# Patient Record
Sex: Male | Born: 1940 | Race: White | Hispanic: No | Marital: Married | Smoking: Never smoker
Health system: Southern US, Community
[De-identification: ages and names within clinical notes are randomized; demographics above are authoritative.]

## PROBLEM LIST (undated history)

## (undated) DIAGNOSIS — I519 Heart disease, unspecified: Secondary | ICD-10-CM

## (undated) DIAGNOSIS — E785 Hyperlipidemia, unspecified: Secondary | ICD-10-CM

## (undated) DIAGNOSIS — F32A Depression, unspecified: Secondary | ICD-10-CM

## (undated) DIAGNOSIS — M1990 Unspecified osteoarthritis, unspecified site: Secondary | ICD-10-CM

## (undated) DIAGNOSIS — IMO0001 Reserved for inherently not codable concepts without codable children: Secondary | ICD-10-CM

## (undated) DIAGNOSIS — I10 Essential (primary) hypertension: Secondary | ICD-10-CM

## (undated) DIAGNOSIS — I4510 Unspecified right bundle-branch block: Secondary | ICD-10-CM

## (undated) DIAGNOSIS — F419 Anxiety disorder, unspecified: Secondary | ICD-10-CM

## (undated) DIAGNOSIS — I252 Old myocardial infarction: Secondary | ICD-10-CM

## (undated) DIAGNOSIS — H9190 Unspecified hearing loss, unspecified ear: Secondary | ICD-10-CM

## (undated) DIAGNOSIS — I2510 Atherosclerotic heart disease of native coronary artery without angina pectoris: Secondary | ICD-10-CM

## (undated) DIAGNOSIS — E669 Obesity, unspecified: Secondary | ICD-10-CM

## (undated) DIAGNOSIS — N184 Chronic kidney disease, stage 4 (severe): Secondary | ICD-10-CM

## (undated) DIAGNOSIS — F329 Major depressive disorder, single episode, unspecified: Secondary | ICD-10-CM

## (undated) DIAGNOSIS — I82409 Acute embolism and thrombosis of unspecified deep veins of unspecified lower extremity: Secondary | ICD-10-CM

## (undated) DIAGNOSIS — I509 Heart failure, unspecified: Secondary | ICD-10-CM

## (undated) HISTORY — DX: Unspecified hearing loss, unspecified ear: H91.90

## (undated) HISTORY — DX: Obesity, unspecified: E66.9

## (undated) HISTORY — DX: Unspecified right bundle-branch block: I45.10

## (undated) HISTORY — PX: ELBOW SURGERY: SHX618

## (undated) HISTORY — DX: Anxiety disorder, unspecified: F41.9

## (undated) HISTORY — DX: Hyperlipidemia, unspecified: E78.5

## (undated) HISTORY — DX: Unspecified osteoarthritis, unspecified site: M19.90

## (undated) HISTORY — PX: KNEE SURGERY: SHX244

## (undated) HISTORY — DX: Major depressive disorder, single episode, unspecified: F32.9

## (undated) HISTORY — DX: Depression, unspecified: F32.A

## (undated) HISTORY — DX: Essential (primary) hypertension: I10

## (undated) HISTORY — DX: Acute embolism and thrombosis of unspecified deep veins of unspecified lower extremity: I82.409

## (undated) HISTORY — PX: SHOULDER OPEN ROTATOR CUFF REPAIR: SHX2407

## (undated) HISTORY — DX: Old myocardial infarction: I25.2

---

## 1992-09-12 DIAGNOSIS — I252 Old myocardial infarction: Secondary | ICD-10-CM

## 1992-09-12 HISTORY — DX: Old myocardial infarction: I25.2

## 1995-01-25 HISTORY — PX: CORONARY ARTERY BYPASS GRAFT: SHX141

## 1995-01-25 HISTORY — PX: CARDIAC CATHETERIZATION: SHX172

## 1997-12-30 ENCOUNTER — Other Ambulatory Visit: Admission: RE | Admit: 1997-12-30 | Discharge: 1997-12-30 | Payer: Self-pay | Admitting: Cardiology

## 2002-11-11 ENCOUNTER — Observation Stay (HOSPITAL_COMMUNITY): Admission: EM | Admit: 2002-11-11 | Discharge: 2002-11-12 | Payer: Self-pay | Admitting: Emergency Medicine

## 2002-11-11 ENCOUNTER — Emergency Department (HOSPITAL_COMMUNITY): Admission: EM | Admit: 2002-11-11 | Discharge: 2002-11-11 | Payer: Self-pay | Admitting: Emergency Medicine

## 2007-09-15 ENCOUNTER — Emergency Department (HOSPITAL_COMMUNITY): Admission: EM | Admit: 2007-09-15 | Discharge: 2007-09-15 | Payer: Self-pay | Admitting: Family Medicine

## 2008-07-11 ENCOUNTER — Encounter: Admission: RE | Admit: 2008-07-11 | Discharge: 2008-07-11 | Payer: Self-pay | Admitting: Internal Medicine

## 2009-06-16 HISTORY — PX: CARDIOVASCULAR STRESS TEST: SHX262

## 2009-07-31 ENCOUNTER — Encounter: Admission: RE | Admit: 2009-07-31 | Discharge: 2009-09-08 | Payer: Self-pay | Admitting: Neurology

## 2009-12-16 ENCOUNTER — Ambulatory Visit: Payer: Self-pay | Admitting: Vascular Surgery

## 2010-06-29 ENCOUNTER — Ambulatory Visit: Payer: Self-pay | Admitting: Cardiology

## 2011-01-25 NOTE — Procedures (Signed)
DUPLEX DEEP VENOUS EXAM - LOWER EXTREMITY   INDICATION:  Left lower extremity pain.   HISTORY:  Edema:  No.  Trauma/Surgery:  No.  Pain:  Yes.  PE:  No.  Previous DVT:  Yes.  Anticoagulants:  No.  Other:   DUPLEX EXAM:                CFV   SFV   PopV  PTV    GSV                R  L  R  L  R  L  R   L  R  L  Thrombosis    o  o     o     o      o     o  Spontaneous   +  +     +     +      +     +  Phasic        +  +     +     +      +     +  Augmentation  +  +     +     +      +     +  Compressible  +  +     +     +      +     +  Competent     +  +     +     +      +     +   Legend:  + - yes  o - no  p - partial  D - decreased   IMPRESSION:  No evidence of deep venous thrombosis or superficial venous  thrombosis identified.    _____________________________  Janetta Hora Fields, MD   CJ/MEDQ  D:  12/16/2009  T:  12/16/2009  Job:  [id number]

## 2011-01-28 NOTE — Discharge Summary (Signed)
NAMECARRON, JAGGI NO.:  [id number]   MEDICAL RECORD NO.:  [id number]                   PATIENT TYPE:  INP   LOCATION:  0484                                 FACILITY:  [facility]   PHYSICIAN:  Sherin Quarry, MD                   DATE OF BIRTH:  [birth year]   DATE OF ADMISSION:  11/11/2002  DATE OF DISCHARGE:  11/12/2002                                 DISCHARGE SUMMARY   HISTORY OF PRESENT ILLNESS:  The patient is a 70 year old man who presented  to the emergency room on March 1, with acute onset of diarrhea, nausea and  vomiting which started during the evening.  Of note, was that he had  recently returned from [place] with three family members all of whom had  similar symptoms.  He tried to manage the problem as an outpatient, but  became concerned about possible dehydration.   PAST MEDICAL HISTORY:  1. Hypertension.  2. Coronary artery disease, status post CABG.  3. Hyperlipidemia.  4. History of seizure disorder.   PHYSICAL EXAMINATION:  VITAL SIGNS:  As described by Dr. Nehemiah Settle, temperature  was 96, blood pressure 155/98, respirations 24, O2 saturations 98%.  HEENT:  Within normal limits.  CHEST:  Clear.  CARDIAC:  Normal S1, S2 without murmur, rub or gallop.  ABDOMEN:  Benign with normal bowel sounds without masses, tenderness or  organomegaly.  NEUROLOGIC:  Testing of his extremities was normal.   LABORATORY DATA AND X-RAY FINDINGS:  Unremarkable including hemoglobin of  13.7.  BMET notable only for a creatinine of 1.7 with mild dehydration.   HOSPITAL COURSE:  On admission, the patient was started on IV hydration, D-5  and normal saline with 15 mEq of Kay Ciel with 150 cc per hour.  He was  administered Phenergan as needed for nausea and Lomotil as needed for  diarrhea.  His condition quite rapidly improved by March 2, at noon.  The  patient was able to eat lunch and said he felt considerably better.  He was  still having some mild  diarrhea symptoms, but he felt that he would be able  to get along alright as an outpatient.  Therefore, at that time the patient  was discharged.   DISCHARGE DIAGNOSES:  1. Acute gastroenteritis, probably viral origin.  2. Hypertension.  3. Coronary artery disease, status post coronary artery bypass graft.  4. Hyperlipidemia.  5. History of seizure disorder.   DISCHARGE MEDICATIONS:  1. Carbatrol 200 mg b.i.d.  2. Claritin p.r.n.  3. Gemfibrozil 600 mg b.i.d.  4. Lipitor, uncertain dose.  5. Naproxen 500 mg b.i.d.  6. Prinivil, uncertain dose.   FOLLOW UP:  The patient will continue to follow up with Dr. Juline Patch and Dr.  Deborah Chalk.   CONDITION ON DISCHARGE:  Good.  Sherin Quarry, MD    SY/MEDQ  D:  11/12/2002  T:  11/12/2002  Job:  [id number]   cc:   Juline Patch, M.D.  [facility].   Colleen Can. Deborah Chalk, M.D.  [address]  [place]  [place] [address]  Fax: [phone]

## 2011-02-04 ENCOUNTER — Other Ambulatory Visit: Payer: Self-pay | Admitting: Cardiology

## 2011-02-04 NOTE — Telephone Encounter (Signed)
Refill Lipitor per pharmacy

## 2011-07-04 ENCOUNTER — Other Ambulatory Visit: Payer: Self-pay | Admitting: Cardiology

## 2011-08-10 ENCOUNTER — Other Ambulatory Visit: Payer: Self-pay | Admitting: *Deleted

## 2011-08-11 ENCOUNTER — Telehealth: Payer: Self-pay | Admitting: Nurse Practitioner

## 2011-08-11 NOTE — Telephone Encounter (Signed)
Pt's lisinopril was denied needs appt, made appt 12-10 with lori, pt leaving to go out of town in the am and won't be back until the weekend, can get refill until then? Pt out x 1 week already uses [facility] [address]

## 2011-08-12 MED ORDER — LISINOPRIL 20 MG PO TABS: 20.0000 mg | ORAL_TABLET | Freq: Every day | ORAL | Status: DC

## 2011-08-17 ENCOUNTER — Encounter: Payer: Self-pay | Admitting: Nurse Practitioner

## 2011-08-22 ENCOUNTER — Encounter: Payer: Self-pay | Admitting: Nurse Practitioner

## 2011-08-22 ENCOUNTER — Ambulatory Visit (INDEPENDENT_AMBULATORY_CARE_PROVIDER_SITE_OTHER): Payer: Medicare Other | Admitting: Nurse Practitioner

## 2011-08-22 VITALS — BP 148/88 | HR 80 | Ht 68.0 in | Wt 205.0 lb

## 2011-08-22 DIAGNOSIS — E119 Type 2 diabetes mellitus without complications: Secondary | ICD-10-CM | POA: Insufficient documentation

## 2011-08-22 DIAGNOSIS — I259 Chronic ischemic heart disease, unspecified: Secondary | ICD-10-CM | POA: Insufficient documentation

## 2011-08-22 DIAGNOSIS — I10 Essential (primary) hypertension: Secondary | ICD-10-CM

## 2011-08-22 DIAGNOSIS — E669 Obesity, unspecified: Secondary | ICD-10-CM

## 2011-08-22 LAB — BASIC METABOLIC PANEL
BUN: 24 mg/dL — ABNORMAL HIGH (ref 6–23)
CO2: 23 mEq/L (ref 19–32)
Calcium: 8.9 mg/dL (ref 8.4–10.5)
Chloride: 107 mEq/L (ref 96–112)
Creatinine, Ser: 1.7 mg/dL — ABNORMAL HIGH (ref 0.4–1.5)
GFR: 43.06 mL/min — ABNORMAL LOW (ref >60.00)
Glucose, Bld: 274 mg/dL — ABNORMAL HIGH (ref 70–99)
Potassium: 4.1 mEq/L (ref 3.5–5.1)
Sodium: 138 mEq/L (ref 135–145)

## 2011-08-22 LAB — HEPATIC FUNCTION PANEL
ALT: 47 U/L (ref 0–53)
AST: 41 U/L — ABNORMAL HIGH (ref 0–37)
Albumin: 4.2 g/dL (ref 3.5–5.2)
Alkaline Phosphatase: 53 U/L (ref 39–117)
Bilirubin, Direct: 0.1 mg/dL (ref 0.0–0.3)
Total Bilirubin: 1.4 mg/dL — ABNORMAL HIGH (ref 0.3–1.2)
Total Protein: 7.8 g/dL (ref 6.0–8.3)

## 2011-08-22 LAB — LIPID PANEL
Cholesterol: 136 mg/dL (ref 0–200)
HDL: 35.7 mg/dL — ABNORMAL LOW (ref >39.00)
Total CHOL/HDL Ratio: 4
Triglycerides: 286 mg/dL — ABNORMAL HIGH (ref 0.0–149.0)
VLDL: 57.2 mg/dL — ABNORMAL HIGH (ref 0.0–40.0)

## 2011-08-22 LAB — LDL CHOLESTEROL, DIRECT: Direct LDL: 62.5 mg/dL

## 2011-08-22 MED ORDER — LISINOPRIL 20 MG PO TABS: 20.0000 mg | ORAL_TABLET | Freq: Every day | ORAL | Status: DC

## 2011-08-22 MED ORDER — NITROGLYCERIN 0.4 MG SL SUBL: 0.4000 mg | SUBLINGUAL_TABLET | SUBLINGUAL | Status: DC | PRN

## 2011-08-22 NOTE — Assessment & Plan Note (Signed)
Encouraged him once again to focus on his health.

## 2011-08-22 NOTE — Assessment & Plan Note (Signed)
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 Last nuclear in 2010. No symptoms at present. Will update his stress test. I have refilled his medicines. Will see him back in 6 months with Dr. Jens Som. Patient is agreeable to this plan and will call if any problems develop in the interim.

## 2011-08-22 NOTE — Assessment & Plan Note (Signed)
Blood pressure is up. He has been out of his Lisinopril for the last 3 weeks. I have refilled today.

## 2011-08-22 NOTE — Progress Notes (Signed)
Tristan Baker Date of Birth: 1941/07/20 Medical Record #[id number]  History of Present Illness: Tristan Baker is seen today after an approximate 14 month history. He is seen for Dr. Jens Som. He is a former patient of Dr. Ronnald Nian. He has a history of known ischemic heart disease with prior MI and CABG back in  diabetes, HTN, obesity, past history of DVT, OA and marital stress. Last nuclear in 2010. He is here today to get his medicines refilled. He says he is doing ok. Has had no labs since his last visit 14 months ago. Has run out of his Lisinopril about 3 weeks ago. Blood pressure is up. No chest pain. He tries to be active but does not exercise. He has never embraced healthy habits. Weight is up.   Current Outpatient Prescriptions on File Prior to Visit  Medication Sig Dispense Refill  . amLODipine (NORVASC) 5 MG tablet Take 5 mg by mouth daily.        Marland Kitchen aspirin 325 MG tablet Take 325 mg by mouth daily.        Marland Kitchen atorvastatin (LIPITOR) 40 MG tablet TAKE 1 TABLET BY MOUTH EVERY DAY  30 tablet  5  . glipiZIDE (GLUCOTROL XL) 5 MG 24 hr tablet Take 5 mg by mouth daily.        Marland Kitchen lisinopril (PRINIVIL,ZESTRIL) 20 MG tablet Take 1 tablet (20 mg total) by mouth daily.  15 tablet  0  . Multiple Vitamin (MULTIVITAMIN) tablet Take 1 tablet by mouth daily.        . nitroGLYCERIN (NITROSTAT) 0.4 MG SL tablet Place 0.4 mg under the tongue every 5 (five) minutes as needed.        . sertraline (ZOLOFT) 100 MG tablet Take 100 mg by mouth daily.        Marland Kitchen zonisamide (ZONEGRAN) 100 MG capsule Take 200 mg by mouth daily.          Allergies  Allergen Reactions  . Trilipix     Past Medical History  Diagnosis Date  . IHD (ischemic heart disease)   . Diabetes mellitus   . Hypertension     INTERMITTENT NONCOMPLIANCE  . Obesity   . OA (osteoarthritis)   . DVT (deep venous thrombosis)   . MI, old 51    POSTERIOR M.I.  . RBBB (right bundle branch block)   . Hyperlipidemia   . Anxiety   . Depression   .  Renal insufficiency, mild   . Decreased hearing   . Calf pain     LEFT    Past Surgical History  Procedure Date  . Cardiac catheterization 01/25/1995    NORMAL  . Coronary artery bypass graft 01/25/1995  . Knee surgery   . Cardiovascular stress test 06/16/2009    EF 43%, INFERIOR LATERAL HYPOKINESIA CONSISTENT WITH PREVIOUS INFARCTION, UNCHANGED FROM 2008. NO EVIDENCE OF ISCHEMIA    History  Smoking status  . Never Smoker   Smokeless tobacco  . Not on file    History  Alcohol Use No    Family History  Problem Relation Age of Onset  . Stroke Mother   . Heart disease Father     Review of Systems: The review of systems is per the HPI.  Has had some recent sinus issues. All other systems were reviewed and are negative.  Physical Exam: Ht 5\' 8"  (1.727 m)  Wt 205 lb (92.987 kg)  BMI 31.17 kg/m2 Patient is very pleasant and in no acute distress. He is obese.  Skin is warm and dry. Color is normal.  HEENT is unremarkable. Normocephalic/atraumatic. PERRL. Sclera are nonicteric. Neck is supple. No masses. No JVD. Lungs are clear. Cardiac exam shows a regular rate and rhythm. Abdomen is obese but soft. Extremities are without edema. Gait and ROM are intact. No gross neurologic deficits noted.   LABORATORY DATA: PENDING  Assessment / Plan:

## 2011-08-22 NOTE — Patient Instructions (Addendum)
Dr. Olga Millers is your attending cardiologist.   We are going to check your labs today.  I have refilled your Lisinopril and NTG. Get back on your Lisinopril for your blood pressure.  We are going to update your stress test.  Call the [facility] office at [phone] if you have any questions, problems or concerns.

## 2011-08-22 NOTE — Assessment & Plan Note (Signed)
Not checking his sugars. He is going to try and arrange follow up with Dr. Clelia Croft.

## 2011-08-23 ENCOUNTER — Telehealth: Payer: Self-pay | Admitting: Nurse Practitioner

## 2011-08-23 NOTE — Telephone Encounter (Signed)
New message:  Pt seen Lawson Fiscal yesterday and has a question about the medication he was given yesterday and the dosage.

## 2011-08-23 NOTE — Telephone Encounter (Signed)
Pt states he was given an rx for Lisinopril 20mg  qd.  He states he has been taking 20mg  bid prior to yesterday and was not aware that the rx had been decreased.  Please advise on the correct dose for pt to take.

## 2011-08-23 NOTE — Telephone Encounter (Signed)
Must have been abstracted wrong. He may take Lisinopril 20 mg BID.  Thanks

## 2011-08-23 NOTE — Telephone Encounter (Signed)
Pt was notified.  [facility] another 30 pills for the price of 60 total instead of two copays.

## 2011-08-25 ENCOUNTER — Other Ambulatory Visit (HOSPITAL_COMMUNITY): Payer: Medicare Other | Admitting: Radiology

## 2011-08-29 ENCOUNTER — Other Ambulatory Visit (HOSPITAL_COMMUNITY): Payer: Medicare Other | Admitting: Radiology

## 2012-11-27 ENCOUNTER — Telehealth: Payer: Self-pay | Admitting: *Deleted

## 2012-11-27 MED ORDER — ATORVASTATIN CALCIUM 40 MG PO TABS: 40.0000 mg | ORAL_TABLET | Freq: Every day | ORAL | Status: DC

## 2012-11-27 NOTE — Telephone Encounter (Signed)
[facility] which per request was filled 08/31/11. Discussed with Lawson Fiscal and since patient has not been here since 2012 refill needs to come from PCP. [facility] [phone]

## 2012-11-27 NOTE — Telephone Encounter (Signed)
S/w pt's wife pt needs refill on atorvastatin 40 mg daily but pt needs appt. Hasn't been seen in our office since 2012 with Dr. Deborah Chalk. [facility] [address] and I also made pt appt to see Norma Fredrickson, NP on 12/04/12.

## 2012-12-04 ENCOUNTER — Other Ambulatory Visit: Payer: Self-pay | Admitting: Nurse Practitioner

## 2012-12-04 ENCOUNTER — Encounter: Payer: Self-pay | Admitting: Nurse Practitioner

## 2012-12-04 ENCOUNTER — Ambulatory Visit (INDEPENDENT_AMBULATORY_CARE_PROVIDER_SITE_OTHER): Payer: Medicare Other | Admitting: Nurse Practitioner

## 2012-12-04 VITALS — BP 150/78 | HR 68 | Ht 68.0 in | Wt 200.0 lb

## 2012-12-04 DIAGNOSIS — E119 Type 2 diabetes mellitus without complications: Secondary | ICD-10-CM

## 2012-12-04 DIAGNOSIS — I259 Chronic ischemic heart disease, unspecified: Secondary | ICD-10-CM

## 2012-12-04 DIAGNOSIS — E785 Hyperlipidemia, unspecified: Secondary | ICD-10-CM

## 2012-12-04 DIAGNOSIS — Z01810 Encounter for preprocedural cardiovascular examination: Secondary | ICD-10-CM

## 2012-12-04 DIAGNOSIS — R079 Chest pain, unspecified: Secondary | ICD-10-CM

## 2012-12-04 LAB — PROTIME-INR
INR: 1 ratio (ref 0.8–1.0)
Prothrombin Time: 10.5 s (ref 10.2–12.4)

## 2012-12-04 LAB — BASIC METABOLIC PANEL
BUN: 18 mg/dL (ref 6–23)
CO2: 26 mEq/L (ref 19–32)
Calcium: 9.2 mg/dL (ref 8.4–10.5)
Chloride: 105 mEq/L (ref 96–112)
Creatinine, Ser: 1.5 mg/dL (ref 0.4–1.5)
GFR: 48.53 mL/min — ABNORMAL LOW (ref >60.00)
Glucose, Bld: 262 mg/dL — ABNORMAL HIGH (ref 70–99)
Potassium: 4.3 mEq/L (ref 3.5–5.1)
Sodium: 137 mEq/L (ref 135–145)

## 2012-12-04 LAB — CBC WITH DIFFERENTIAL/PLATELET
Basophils Absolute: 0 10*3/uL (ref 0.0–0.1)
Basophils Relative: 0.5 % (ref 0.0–3.0)
Eosinophils Absolute: 0.1 10*3/uL (ref 0.0–0.7)
Eosinophils Relative: 2.3 % (ref 0.0–5.0)
HCT: 40.8 % (ref 39.0–52.0)
Hemoglobin: 14 g/dL (ref 13.0–17.0)
Lymphocytes Relative: 20.5 % (ref 12.0–46.0)
Lymphs Abs: 1.2 10*3/uL (ref 0.7–4.0)
MCHC: 34.3 g/dL (ref 30.0–36.0)
MCV: 95.7 fl (ref 78.0–100.0)
Monocytes Absolute: 0.4 10*3/uL (ref 0.1–1.0)
Monocytes Relative: 6.6 % (ref 3.0–12.0)
Neutro Abs: 4.1 10*3/uL (ref 1.4–7.7)
Neutrophils Relative %: 70.1 % (ref 43.0–77.0)
Platelets: 213 10*3/uL (ref 150.0–400.0)
RBC: 4.27 Mil/uL (ref 4.22–5.81)
RDW: 13 % (ref 11.5–14.6)
WBC: 5.9 10*3/uL (ref 4.5–10.5)

## 2012-12-04 LAB — HEPATIC FUNCTION PANEL
ALT: 39 U/L (ref 0–53)
AST: 40 U/L — ABNORMAL HIGH (ref 0–37)
Albumin: 4 g/dL (ref 3.5–5.2)
Alkaline Phosphatase: 57 U/L (ref 39–117)
Bilirubin, Direct: 0.1 mg/dL (ref 0.0–0.3)
Total Bilirubin: 0.9 mg/dL (ref 0.3–1.2)
Total Protein: 7.5 g/dL (ref 6.0–8.3)

## 2012-12-04 LAB — LIPID PANEL
Cholesterol: 146 mg/dL (ref 0–200)
HDL: 31.1 mg/dL — ABNORMAL LOW (ref >39.00)
Total CHOL/HDL Ratio: 5
Triglycerides: 522 mg/dL — ABNORMAL HIGH (ref 0.0–149.0)
VLDL: 104.4 mg/dL — ABNORMAL HIGH (ref 0.0–40.0)

## 2012-12-04 LAB — APTT: aPTT: 28.6 s (ref 21.7–28.8)

## 2012-12-04 LAB — LDL CHOLESTEROL, DIRECT: Direct LDL: 58.5 mg/dL

## 2012-12-04 LAB — HEMOGLOBIN A1C: Hgb A1c MFr Bld: 8.2 % — ABNORMAL HIGH (ref 4.6–6.5)

## 2012-12-04 MED ORDER — NITROGLYCERIN 0.4 MG SL SUBL: 0.4000 mg | SUBLINGUAL_TABLET | SUBLINGUAL | Status: DC | PRN

## 2012-12-04 NOTE — Progress Notes (Addendum)
 Tristan Baker Date of Birth: 05/30/1941 Medical Record #[id number]  History of Present Illness: Tristan Baker is seen back today for a follow up visit. He is seen for Tristan Baker. He is a former patient of Tristan Baker. Last seen here in December of 2012. 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 Last stress test was in 2010 showing EF of 43% with inferior lateral hypokinesia and no ischemia. Other issues include DM, HTN, obesity, past DVT, OA and marital stress. Does not exercise. Never has embraced healthy habits.   He comes in today. He is here alone. Needs his medicines refilled again. No recent lab again. Not exercising. Has lost 5 pounds but does not know how. Has started having exertional chest tightness since January. Resolves with rest. Associated with shortness of breath. No spells at rest. Not checking his sugars. Does not know what his A1C is running. Has not seen Tristan Baker in quite a while. Stress testing was recommended at his last visit but he cancelled.    Current Outpatient Prescriptions on File Prior to Visit  Medication Sig Dispense Refill  . amLODipine (NORVASC) 5 MG tablet Take 5 mg by mouth daily.        . aspirin 325 MG tablet Take 325 mg by mouth daily.        . atorvastatin (LIPITOR) 40 MG tablet Take 1 tablet (40 mg total) by mouth daily.  30 tablet  0  . glipiZIDE (GLUCOTROL XL) 5 MG 24 hr tablet Take 5 mg by mouth daily.        . lisinopril (PRINIVIL,ZESTRIL) 20 MG tablet Take 1 tablet (20 mg total) by mouth daily.  30 tablet  0  . Multiple Vitamin (MULTIVITAMIN) tablet Take 1 tablet by mouth daily.        . nitroGLYCERIN (NITROSTAT) 0.4 MG SL tablet Place 1 tablet (0.4 mg total) under the tongue every 5 (five) minutes as needed.  25 tablet  6  . sertraline (ZOLOFT) 100 MG tablet Take 100 mg by mouth daily.        . zonisamide (ZONEGRAN) 100 MG capsule Take 200 mg by mouth daily.         No current facility-administered medications on file prior to  visit.    Allergies  Allergen Reactions  . Choline Fenofibrate     Past Medical History  Diagnosis Date  . IHD (ischemic heart disease)     Remote MI in 1994 with CABG in 1996; last nuclear in 2010  . Diabetes mellitus   . Hypertension     INTERMITTENT NONCOMPLIANCE  . Obesity   . OA (osteoarthritis)   . DVT (deep venous thrombosis)   . MI, old 1994    POSTERIOR M.I.  . RBBB (right bundle branch block)   . Hyperlipidemia   . Anxiety   . Depression   . Renal insufficiency, mild   . Decreased hearing     Past Surgical History  Procedure Laterality Date  . Cardiac catheterization  01/25/1995    NORMAL  . Coronary artery bypass graft  01/25/1995  . Knee surgery    . Cardiovascular stress test  06/16/2009    EF 43%, INFERIOR LATERAL HYPOKINESIA CONSISTENT WITH PREVIOUS INFARCTION, UNCHANGED FROM 2008. NO EVIDENCE OF ISCHEMIA    History  Smoking status  . Never Smoker   Smokeless tobacco  . Not on file    History  Alcohol Use No    Family History  Problem Relation   Age of Onset  . Stroke Mother   . Heart disease Father     Review of Systems: The review of systems is per the HPI.  All other systems were reviewed and are negative.  Physical Exam: BP 150/78  Pulse 68  Ht 5' 8" (1.727 m)  Wt 200 lb (90.719 kg)  BMI 30.42 kg/m2 Patient is an obese white male who is in no acute distress. He remains obese. Skin is warm and dry. Color is normal.  HEENT is unremarkable. Normocephalic/atraumatic. PERRL. Sclera are nonicteric. Neck is supple. No masses. No JVD. Lungs are clear. Cardiac exam shows a regular rate and rhythm. Abdomen is soft. Extremities are without edema. Gait and ROM are intact. No gross neurologic deficits noted.   LABORATORY DATA: PENDING  EKG is obtained today. He is in sinus rhythm with a RBBB. Inferior Q's. I do not have an old EKG to compare with.   Lab Results  Component Value Date   GLUCOSE 274* 08/22/2011   CHOL 136 08/22/2011   TRIG  286.0* 08/22/2011   HDL 35.70* 08/22/2011   LDLDIRECT 62.5 08/22/2011   ALT 47 08/22/2011   AST 41* 08/22/2011   NA 138 08/22/2011   K 4.1 08/22/2011   CL 107 08/22/2011   CREATININE 1.7* 08/22/2011   BUN 24* 08/22/2011   CO2 23 08/22/2011   Assessment / Plan: 1. CAD - remote CABG - last stress test in 2010 - he is having recurrent exertional chest tightness that resolves with rest and is associated with dyspnea. I have recommended cardiac catheterization. He has never been cathed since his original surgery with Dr. Shanisha Baker. The procedure, risks and benefits have been reviewed and he is willing to proceed.   2. Obesity - still an issue  3. HTN - BP is up some today. Says he is taking his medicines. Does not check at home.   4. HLD - recheck labs today  5. DM - check A1C today.  Patient is agreeable to this plan and will call if any problems develop in the interim.   Tristan Yaffe C. Bailen Geffre, RN, ANP-C [facility] [address] [place], [place]  [address]  

## 2012-12-04 NOTE — Patient Instructions (Addendum)
We will check labs today  Stay on your current medicines  I have refilled your NTG today  We are going to arrange for a heart catheterization next Tuesday with Dr. [place]  You are scheduled for a cardiac catheterization on Tuesday, April 1st with Dr. [place] or associate.  Go to [facility] 2nd Floor Short Stay on Tuesday, April 1st at 7am. No food or drink after midnight on Monday. You may take your medications with a sip of water on the day of your procedure. But do not take your diabetic medicine that morning.

## 2012-12-11 ENCOUNTER — Encounter (HOSPITAL_COMMUNITY)
Admission: RE | Disposition: A | Discharge status: Home / Self Care | Payer: Self-pay | Source: Ambulatory Visit | Attending: Cardiology

## 2012-12-11 ENCOUNTER — Encounter (HOSPITAL_COMMUNITY): Payer: Self-pay | Admitting: General Practice

## 2012-12-11 ENCOUNTER — Ambulatory Visit (HOSPITAL_COMMUNITY): Payer: Medicare Other

## 2012-12-11 ENCOUNTER — Ambulatory Visit (HOSPITAL_COMMUNITY)
Admission: RE | Admit: 2012-12-11 | Discharge: 2012-12-12 | Disposition: A | Discharge status: Home / Self Care | Payer: Medicare Other | Source: Ambulatory Visit | Attending: Cardiology | Admitting: Cardiology

## 2012-12-11 DIAGNOSIS — N183 Chronic kidney disease, stage 3 unspecified: Secondary | ICD-10-CM | POA: Insufficient documentation

## 2012-12-11 DIAGNOSIS — Z6830 Body mass index (BMI) 30.0-30.9, adult: Secondary | ICD-10-CM | POA: Insufficient documentation

## 2012-12-11 DIAGNOSIS — I259 Chronic ischemic heart disease, unspecified: Secondary | ICD-10-CM | POA: Diagnosis present

## 2012-12-11 DIAGNOSIS — E785 Hyperlipidemia, unspecified: Secondary | ICD-10-CM | POA: Insufficient documentation

## 2012-12-11 DIAGNOSIS — Z7902 Long term (current) use of antithrombotics/antiplatelets: Secondary | ICD-10-CM | POA: Insufficient documentation

## 2012-12-11 DIAGNOSIS — I209 Angina pectoris, unspecified: Secondary | ICD-10-CM | POA: Insufficient documentation

## 2012-12-11 DIAGNOSIS — I25810 Atherosclerosis of coronary artery bypass graft(s) without angina pectoris: Secondary | ICD-10-CM | POA: Insufficient documentation

## 2012-12-11 DIAGNOSIS — I2510 Atherosclerotic heart disease of native coronary artery without angina pectoris: Secondary | ICD-10-CM | POA: Insufficient documentation

## 2012-12-11 DIAGNOSIS — I129 Hypertensive chronic kidney disease with stage 1 through stage 4 chronic kidney disease, or unspecified chronic kidney disease: Secondary | ICD-10-CM | POA: Insufficient documentation

## 2012-12-11 DIAGNOSIS — I517 Cardiomegaly: Secondary | ICD-10-CM

## 2012-12-11 DIAGNOSIS — E119 Type 2 diabetes mellitus without complications: Secondary | ICD-10-CM | POA: Insufficient documentation

## 2012-12-11 DIAGNOSIS — Z8679 Personal history of other diseases of the circulatory system: Secondary | ICD-10-CM

## 2012-12-11 DIAGNOSIS — I4510 Unspecified right bundle-branch block: Secondary | ICD-10-CM | POA: Insufficient documentation

## 2012-12-11 DIAGNOSIS — Z01810 Encounter for preprocedural cardiovascular examination: Secondary | ICD-10-CM

## 2012-12-11 DIAGNOSIS — I10 Essential (primary) hypertension: Secondary | ICD-10-CM | POA: Diagnosis present

## 2012-12-11 DIAGNOSIS — E669 Obesity, unspecified: Secondary | ICD-10-CM | POA: Insufficient documentation

## 2012-12-11 HISTORY — DX: Heart disease, unspecified: I51.9

## 2012-12-11 HISTORY — DX: Atherosclerotic heart disease of native coronary artery without angina pectoris: I25.10

## 2012-12-11 HISTORY — PX: LEFT HEART CATHETERIZATION WITH CORONARY/GRAFT ANGIOGRAM: SHX5450

## 2012-12-11 LAB — GLUCOSE, CAPILLARY: Glucose-Capillary: 111 mg/dL — ABNORMAL HIGH (ref 70–99)

## 2012-12-11 SURGERY — LEFT HEART CATHETERIZATION WITH CORONARY/GRAFT ANGIOGRAM
Anesthesia: LOCAL

## 2012-12-11 MED ORDER — NITROGLYCERIN 1 MG/10 ML FOR IR/CATH LAB
INTRA_ARTERIAL | Status: AC
  Filled 2012-12-11: qty 10

## 2012-12-11 MED ORDER — SODIUM CHLORIDE 0.9 % IV SOLN: 1.0000 mL/kg/h | INTRAVENOUS | Status: AC

## 2012-12-11 MED ORDER — AMLODIPINE BESYLATE 5 MG PO TABS
5.0000 mg | ORAL_TABLET | Freq: Every day | ORAL | Status: DC
  Administered 2012-12-11 – 2012-12-12 (×2): 5 mg via ORAL
  Filled 2012-12-11 (×2): qty 1

## 2012-12-11 MED ORDER — GLIPIZIDE ER 5 MG PO TB24
5.0000 mg | ORAL_TABLET | Freq: Every day | ORAL | Status: DC
  Administered 2012-12-11 – 2012-12-12 (×2): 5 mg via ORAL
  Filled 2012-12-11 (×3): qty 1

## 2012-12-11 MED ORDER — DIAZEPAM 5 MG PO TABS
5.0000 mg | ORAL_TABLET | ORAL | Status: AC
  Administered 2012-12-11: 5 mg via ORAL

## 2012-12-11 MED ORDER — HEPARIN (PORCINE) IN NACL 2-0.9 UNIT/ML-% IJ SOLN
INTRAMUSCULAR | Status: AC
  Filled 2012-12-11: qty 1000

## 2012-12-11 MED ORDER — ACETAMINOPHEN 325 MG PO TABS
650.0000 mg | ORAL_TABLET | ORAL | Status: DC | PRN
  Administered 2012-12-11: 650 mg via ORAL
  Filled 2012-12-11: qty 2

## 2012-12-11 MED ORDER — SODIUM CHLORIDE 0.9 % IV SOLN: INTRAVENOUS | Status: DC

## 2012-12-11 MED ORDER — ASPIRIN 81 MG PO CHEW
CHEWABLE_TABLET | ORAL | Status: AC
  Filled 2012-12-11: qty 4

## 2012-12-11 MED ORDER — ONDANSETRON HCL 4 MG/2ML IJ SOLN: 4.0000 mg | Freq: Four times a day (QID) | INTRAMUSCULAR | Status: DC | PRN

## 2012-12-11 MED ORDER — NITROGLYCERIN 0.4 MG SL SUBL: 0.4000 mg | SUBLINGUAL_TABLET | SUBLINGUAL | Status: DC | PRN

## 2012-12-11 MED ORDER — ATORVASTATIN CALCIUM 40 MG PO TABS
40.0000 mg | ORAL_TABLET | Freq: Every day | ORAL | Status: DC
  Administered 2012-12-11: 40 mg via ORAL
  Filled 2012-12-11 (×2): qty 1

## 2012-12-11 MED ORDER — SODIUM CHLORIDE 0.9 % IV SOLN: 250.0000 mL | INTRAVENOUS | Status: DC | PRN

## 2012-12-11 MED ORDER — PRASUGREL HCL 10 MG PO TABS
10.0000 mg | ORAL_TABLET | Freq: Every day | ORAL | Status: DC
  Filled 2012-12-11: qty 1

## 2012-12-11 MED ORDER — DIAZEPAM 5 MG PO TABS
ORAL_TABLET | ORAL | Status: AC
  Filled 2012-12-11: qty 1

## 2012-12-11 MED ORDER — SODIUM CHLORIDE 0.9 % IJ SOLN: 3.0000 mL | Freq: Two times a day (BID) | INTRAMUSCULAR | Status: DC

## 2012-12-11 MED ORDER — PRASUGREL HCL 10 MG PO TABS
ORAL_TABLET | ORAL | Status: AC
  Administered 2012-12-12: 10 mg via ORAL
  Filled 2012-12-11: qty 6

## 2012-12-11 MED ORDER — FENTANYL CITRATE 0.05 MG/ML IJ SOLN
INTRAMUSCULAR | Status: AC
  Filled 2012-12-11: qty 2

## 2012-12-11 MED ORDER — ADULT MULTIVITAMIN W/MINERALS CH
1.0000 | ORAL_TABLET | Freq: Every day | ORAL | Status: DC
  Administered 2012-12-11 – 2012-12-12 (×2): 1 via ORAL
  Filled 2012-12-11 (×2): qty 1

## 2012-12-11 MED ORDER — SODIUM CHLORIDE 0.9 % IJ SOLN: 3.0000 mL | INTRAMUSCULAR | Status: DC | PRN

## 2012-12-11 MED ORDER — ONE-DAILY MULTI VITAMINS PO TABS
1.0000 | ORAL_TABLET | Freq: Every day | ORAL | Status: DC
Start: 2012-12-11 — End: 2012-12-11

## 2012-12-11 MED ORDER — LIDOCAINE HCL (PF) 1 % IJ SOLN
INTRAMUSCULAR | Status: AC
  Filled 2012-12-11: qty 30

## 2012-12-11 MED ORDER — MIDAZOLAM HCL 2 MG/2ML IJ SOLN
INTRAMUSCULAR | Status: AC
  Filled 2012-12-11: qty 2

## 2012-12-11 MED ORDER — BIVALIRUDIN 250 MG IV SOLR
INTRAVENOUS | Status: AC
  Filled 2012-12-11: qty 250

## 2012-12-11 MED ORDER — SERTRALINE HCL 100 MG PO TABS
100.0000 mg | ORAL_TABLET | Freq: Every day | ORAL | Status: DC
  Administered 2012-12-11 – 2012-12-12 (×2): 100 mg via ORAL
  Filled 2012-12-11 (×2): qty 1

## 2012-12-11 MED ORDER — ASPIRIN 81 MG PO CHEW
324.0000 mg | CHEWABLE_TABLET | ORAL | Status: AC
  Administered 2012-12-11: 324 mg via ORAL

## 2012-12-11 MED ORDER — ZONISAMIDE 100 MG PO CAPS
200.0000 mg | ORAL_CAPSULE | Freq: Every day | ORAL | Status: DC
  Administered 2012-12-11 – 2012-12-12 (×2): 200 mg via ORAL
  Filled 2012-12-11 (×2): qty 2

## 2012-12-11 NOTE — Progress Notes (Signed)
*  PRELIMINARY RESULTS* Echocardiogram 2D Echocardiogram has been performed.  Jeryl Columbia 12/11/2012, 4:13 PM

## 2012-12-11 NOTE — Interval H&P Note (Signed)
History and Physical Interval Note:  12/11/2012 9:12 AM  Tristan Baker  has presented today for surgery, with the diagnosis of cp/cad  The various methods of treatment have been discussed with the patient and family. After consideration of risks, benefits and other options for treatment, the patient has consented to  Procedure(s): LEFT HEART CATHETERIZATION WITH CORONARY/GRAFT ANGIOGRAM (N/A) as a surgical intervention .  The patient's history has been reviewed, patient examined, no change in status, stable for surgery.  I have reviewed the patient's chart and labs.  Questions were answered to the patient's satisfaction.     Theron Arista [facility] 12/11/2012 9:12 AM

## 2012-12-11 NOTE — Progress Notes (Signed)
Utilization Review Completed Garreth Burnsworth J. Michaelanthony Kempton, RN, BSN, NCM [phone]  

## 2012-12-11 NOTE — CV Procedure (Signed)
Cardiac Catheterization Procedure Note  Name: Tristan Baker MRN: [id number] DOB: Sep 08, 1941  Procedure: Left Heart Cath, Selective Coronary Angiography, saphenous vein graft angiography, RIMA and LIMA graft angiography, PTCA/Stent of the SVG to the OMs  Indication: 72 year-old white male with history of diabetes, hyperlipidemia, and hypertension. He is status post coronary bypass surgery in 1996. He presents with progressive anginal symptoms class III.   Diagnostic Procedure Details: The right groin was prepped, draped, and anesthetized with 1% lidocaine. Using the modified Seldinger technique, a 5 French sheath was introduced into the right femoral artery. Standard Judkins catheters were used for selective coronary angiography and left ventriculography. Catheter exchanges were performed over a wire.  The diagnostic procedure was well-tolerated without immediate complications. A total of 105 cc of Omnipaque was used for the entire procedure.  PROCEDURAL FINDINGS Hemodynamics: AO 125/66 with a mean of 92 mmHg LV 124/17 mmHg  Coronary angiography: Coronary dominance: right  Left mainstem: The left main coronary is diffusely diseased up to 80-90%.  Left anterior descending (LAD): The left anterior descending artery is occluded after the second diagonal branch. The first and second diagonal branches are small in size and without significant disease.  Left circumflex (LCx): The left circumflex is occluded proximally.  Right coronary artery (RCA): The right coronary is occluded at its origin.  There is a sequential saphenous vein graft to the first and second obtuse marginal vessels. There is a 95% stenosis in the distal vein graft between the 2 marginal branches.  The RIMA graft is in situ. It is a very large graft which inserts into PDA. It has retrograde filling of the distal right coronary and posterior lateral branches. The graft is widely patent. There is an 80-90% stenosis in the PDA  in the mid to distal vessel. The posterior lateral branches are diffusely diseased up to 50-70% proximally.  The LIMA graft to the LAD is a large graft that is widely patent. The LAD does give collaterals to the distal RCA and the obtuse marginal vessel.  Left ventriculography: Not performed due to to concerns about dye load.  PCI Procedure Note:  Following the diagnostic procedure, the decision was made to proceed with PCI. The sheath was upsized to a 6 [place]. Weight-based bivalirudin was given for anticoagulation. Effient 60 mg was given orally. Once a therapeutic ACT was achieved, a 6 French LCB guide catheter was inserted.  A pro-water coronary guidewire was used to cross the lesion. There was not enough landing zone for a filter wire distal to the lesion. The lesion was predilated with a 2.5 mm balloon.  The lesion was then stented with a 3.5 x 12 mm Promus premier stent.  Following PCI, there was 0% residual stenosis and TIMI-3 flow. Final angiography confirmed an excellent result. Femoral hemostasis was achieved with manual compression.  The patient tolerated the PCI procedure well. There were no immediate procedural complications.  The patient was transferred to the post catheterization recovery area for further monitoring.  PCI Data: Vessel - SVG to OM1 and 2/Segment - distal Percent Stenosis (pre)  95% TIMI-flow 2 Stent 3.5 x 12 mm Promus premier Percent Stenosis (post) 0% TIMI-flow (post) 3  Final Conclusions:   1. Severe three-vessel and left main obstructive coronary disease. 2. Patent LIMA graft to the LAD 3. Patent RIMA graft to the PDA 4. Severe stenosis in the saphenous vein graft to the first and second obtuse marginal vessels between the 2 grafted vessels. 5. Successful stenting of the saphenous  vein graft to the OM is using a drug-eluting stent.  Recommendations: Continue dual antiplatelet therapy for at least one year. We will assess LV function cardiogram.  Theron Arista  [facility] 12/11/2012, 10:05 AM

## 2012-12-11 NOTE — Progress Notes (Signed)
Site area: right groin  Site Prior to Removal:  Level 0  Pressure Applied For 25 MINUTES    Minutes Beginning at 1250  Manual:   yes  Patient Status During Pull:  AAO X 3  Post Pull Groin Site:  Level 0  Post Pull Instructions Given:  yes  Post Pull Pulses Present:  yes  Dressing Applied:  yes  Comments:  Tolerated procedure well

## 2012-12-11 NOTE — H&P (View-Only) (Signed)
Tristan Baker Date of Birth: Sep 25, 1940 Medical Record #[id number]  History of Present Illness: Tristan Baker is seen back today for a follow up visit. He is seen for Dr. Jens Som. He is a former patient of Dr. Ronnald Nian. Last seen here in December of 2012. 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 Last stress test was in 2010 showing EF of 43% with inferior lateral hypokinesia and no ischemia. Other issues include DM, HTN, obesity, past DVT, OA and marital stress. Does not exercise. Never has embraced healthy habits.   He comes in today. He is here alone. Needs his medicines refilled again. No recent lab again. Not exercising. Has lost 5 pounds but does not know how. Has started having exertional chest tightness since January. Resolves with rest. Associated with shortness of breath. No spells at rest. Not checking his sugars. Does not know what his A1C is running. Has not seen Dr. Clelia Croft in quite a while. Stress testing was recommended at his last visit but he cancelled.    Current Outpatient Prescriptions on File Prior to Visit  Medication Sig Dispense Refill  . amLODipine (NORVASC) 5 MG tablet Take 5 mg by mouth daily.        Marland Kitchen aspirin 325 MG tablet Take 325 mg by mouth daily.        Marland Kitchen atorvastatin (LIPITOR) 40 MG tablet Take 1 tablet (40 mg total) by mouth daily.  30 tablet  0  . glipiZIDE (GLUCOTROL XL) 5 MG 24 hr tablet Take 5 mg by mouth daily.        Marland Kitchen lisinopril (PRINIVIL,ZESTRIL) 20 MG tablet Take 1 tablet (20 mg total) by mouth daily.  30 tablet  0  . Multiple Vitamin (MULTIVITAMIN) tablet Take 1 tablet by mouth daily.        . nitroGLYCERIN (NITROSTAT) 0.4 MG SL tablet Place 1 tablet (0.4 mg total) under the tongue every 5 (five) minutes as needed.  25 tablet  6  . sertraline (ZOLOFT) 100 MG tablet Take 100 mg by mouth daily.        Marland Kitchen zonisamide (ZONEGRAN) 100 MG capsule Take 200 mg by mouth daily.         No current facility-administered medications on file prior to  visit.    Allergies  Allergen Reactions  . Choline Fenofibrate     Past Medical History  Diagnosis Date  . IHD (ischemic heart disease)     Remote MI in 1994 with CABG in 1996; last nuclear in 2010  . Diabetes mellitus   . Hypertension     INTERMITTENT NONCOMPLIANCE  . Obesity   . OA (osteoarthritis)   . DVT (deep venous thrombosis)   . MI, old 77    POSTERIOR M.I.  . RBBB (right bundle branch block)   . Hyperlipidemia   . Anxiety   . Depression   . Renal insufficiency, mild   . Decreased hearing     Past Surgical History  Procedure Laterality Date  . Cardiac catheterization  01/25/1995    NORMAL  . Coronary artery bypass graft  01/25/1995  . Knee surgery    . Cardiovascular stress test  06/16/2009    EF 43%, INFERIOR LATERAL HYPOKINESIA CONSISTENT WITH PREVIOUS INFARCTION, UNCHANGED FROM 2008. NO EVIDENCE OF ISCHEMIA    History  Smoking status  . Never Smoker   Smokeless tobacco  . Not on file    History  Alcohol Use No    Family History  Problem Relation  Age of Onset  . Stroke Mother   . Heart disease Father     Review of Systems: The review of systems is per the HPI.  All other systems were reviewed and are negative.  Physical Exam: BP 150/78  Pulse 68  Ht 5\' 8"  (1.727 m)  Wt 200 lb (90.719 kg)  BMI 30.42 kg/m2 Patient is an obese white male who is in no acute distress. He remains obese. Skin is warm and dry. Color is normal.  HEENT is unremarkable. Normocephalic/atraumatic. PERRL. Sclera are nonicteric. Neck is supple. No masses. No JVD. Lungs are clear. Cardiac exam shows a regular rate and rhythm. Abdomen is soft. Extremities are without edema. Gait and ROM are intact. No gross neurologic deficits noted.   LABORATORY DATA: PENDING  EKG is obtained today. He is in sinus rhythm with a RBBB. Inferior Q's. I do not have an old EKG to compare with.   Lab Results  Component Value Date   GLUCOSE 274* 08/22/2011   CHOL 136 08/22/2011   TRIG  286.0* 08/22/2011   HDL 35.70* 08/22/2011   LDLDIRECT 62.5 08/22/2011   ALT 47 08/22/2011   AST 41* 08/22/2011   NA 138 08/22/2011   K 4.1 08/22/2011   CL 107 08/22/2011   CREATININE 1.7* 08/22/2011   BUN 24* 08/22/2011   CO2 23 08/22/2011   Assessment / Plan: 1. CAD - remote CABG - last stress test in 2010 - he is having recurrent exertional chest tightness that resolves with rest and is associated with dyspnea. I have recommended cardiac catheterization. He has never been cathed since his original surgery with Dr. Tyrone Sage. The procedure, risks and benefits have been reviewed and he is willing to proceed.   2. Obesity - still an issue  3. HTN - BP is up some today. Says he is taking his medicines. Does not check at home.   4. HLD - recheck labs today  5. DM - check A1C today.  Patient is agreeable to this plan and will call if any problems develop in the interim.   Rosalio Macadamia, RN, ANP-C [facility] [address] [place], [place]  [address]

## 2012-12-12 ENCOUNTER — Other Ambulatory Visit: Payer: Self-pay | Admitting: Physician Assistant

## 2012-12-12 ENCOUNTER — Encounter (HOSPITAL_COMMUNITY): Payer: Self-pay | Admitting: Physician Assistant

## 2012-12-12 DIAGNOSIS — Z8679 Personal history of other diseases of the circulatory system: Secondary | ICD-10-CM

## 2012-12-12 DIAGNOSIS — I2510 Atherosclerotic heart disease of native coronary artery without angina pectoris: Secondary | ICD-10-CM | POA: Diagnosis present

## 2012-12-12 DIAGNOSIS — I10 Essential (primary) hypertension: Secondary | ICD-10-CM

## 2012-12-12 DIAGNOSIS — N189 Chronic kidney disease, unspecified: Secondary | ICD-10-CM

## 2012-12-12 DIAGNOSIS — I259 Chronic ischemic heart disease, unspecified: Secondary | ICD-10-CM

## 2012-12-12 LAB — CBC
HCT: 38 % — ABNORMAL LOW (ref 39.0–52.0)
MCH: 32.4 pg (ref 26.0–34.0)
MCV: 93.1 fL (ref 78.0–100.0)
RDW: 12.5 % (ref 11.5–15.5)
WBC: 7.7 10*3/uL (ref 4.0–10.5)

## 2012-12-12 LAB — BASIC METABOLIC PANEL
BUN: 24 mg/dL — ABNORMAL HIGH (ref 6–23)
Calcium: 9.7 mg/dL (ref 8.4–10.5)
Chloride: 105 mEq/L (ref 96–112)
Creatinine, Ser: 1.79 mg/dL — ABNORMAL HIGH (ref 0.50–1.35)
GFR calc Af Amer: 42 mL/min — ABNORMAL LOW (ref >90)

## 2012-12-12 LAB — GLUCOSE, CAPILLARY: Glucose-Capillary: 148 mg/dL — ABNORMAL HIGH (ref 70–99)

## 2012-12-12 MED ORDER — ASPIRIN 81 MG PO CHEW
81.0000 mg | CHEWABLE_TABLET | Freq: Every day | ORAL | Status: DC
  Administered 2012-12-12: 81 mg via ORAL
  Filled 2012-12-12: qty 1

## 2012-12-12 MED ORDER — NITROGLYCERIN 0.4 MG SL SUBL: 0.4000 mg | SUBLINGUAL_TABLET | SUBLINGUAL | Status: AC | PRN

## 2012-12-12 MED ORDER — ASPIRIN 81 MG PO TABS: 81.0000 mg | ORAL_TABLET | Freq: Every day | ORAL | Status: AC

## 2012-12-12 MED ORDER — PRASUGREL HCL 10 MG PO TABS: 10.0000 mg | ORAL_TABLET | Freq: Every day | ORAL | Status: DC

## 2012-12-12 NOTE — Discharge Summary (Signed)
Discharge Summary   Patient ID: Tristan Baker MRN: [id number], DOB/AGE: 1941/08/01 72 y.o. Admit date: 12/11/2012 D/C date:     12/12/2012  Primary Cardiologist: Tristan Baker, former pt of Tristan Baker  Primary Discharge Diagnoses:  1. CAD/USA - DES of saphenous vein graft to the OM 12/11/12 - history of prior MI 1994, CABG 1996 2. CKD stage 3 - ACEI on hold until stable  3. HTN  4. Hyperlipidemia  5. DM  6. RBBB 7. LV dysfunction with EF 45% by echo this admission  Secondary Discharge Diagnoses:  1. H/o DVT 2. OA 3. Obesity bmi 30.5 4. Decreased hearing   Hospital Course: Mr. Tristan Baker is a 72 y/o M with history of prior MI and CABG in 1996, DM, HTN, obesity, past DVT, OA and marital stress. He presented to the office 12/04/12 with complaints of exertional chest tightness since January associated with SOB. He has not seen Tristan Baker in quite a while and had not been monitoring his diabetes. Stress testing was recommended at his last visit but he cancelled. EKG showed SR with RBBB inferior Q waves. His symptoms were concerning for unstable angina and cardiac cath was recommended. He underwent this procedure yesterday showing: 1. Severe three-vessel and left main obstructive coronary disease.  2. Patent LIMA graft to the LAD  3. Patent RIMA graft to the PDA  4. Severe stenosis in the saphenous vein graft to the first and second obtuse marginal vessels between the 2 grafted vessels.  5. Successful stenting of the saphenous vein graft to the OM is using a drug-eluting stent. ASA/Effient recommended x 1 yr. He was given the 30 day free RX and additional RX as well. 2D echo 4/1 showed EF 45%, technically limited study. He did well post procedurally. With regards to his CKD, baseline Cr 1.7 in 08/2011, 1.5 pre-procedure, 1.79 post-procedure. Tristan Baker has encouraged PO hydration with BMET on Friday. The patient has followup with Dr. Caryn Baker on May 1. Tristan Baker recommends holding ACEI until Cr stable. HR has  been running in the 60s so he is not on BB at present, could consider initiation at f/u. Tristan Baker has seen and examined the patient and feels he is stable for discharge today.  Discharge Vitals: Blood pressure 147/76, pulse 72, temperature 98 F (36.7 C), temperature source Oral, resp. rate 18, height 5\' 8"  (1.727 m), weight 200 lb 9.9 oz (91 kg), SpO2 93.00%.  Labs: Lab Results  Component Value Date   WBC 7.7 12/12/2012   HGB 13.2 12/12/2012   HCT 38.0* 12/12/2012   MCV 93.1 12/12/2012   PLT 171 12/12/2012     Recent Labs Lab 12/12/12 0435  NA 139  K 5.1  CL 105  CO2 26  BUN 24*  CREATININE 1.79*  CALCIUM 9.7  GLUCOSE 138*    Diagnostic Studies/Procedures   1. Cardiac catheterization this admission, please see full report and above for summary.  2. 2D Echo 12/11/12 Study Conclusions - Left ventricle: Severe hypokinesis of the inferlateral wall. TECHNICALLY VERY LIMITED STUDY. The estimated ejection fraction was 45%. Findings consistent with left ventricular diastolic dysfunction. - Aortic valve: Sclerosis without stenosis. - Left atrium: The atrium was mildly dilated. - Right ventricle: RV function may be OK. Not able to assess RV function due to poor visualization. Not able to assess RV size due to poor visualization.  3. Chest 2 View 12/11/2012  *RADIOLOGY REPORT*  Clinical Data: Coronary artery disease, dyspnea  CHEST - 2 VIEW  Comparison: September 15, 2007.  Findings: Sternotomy wires are noted.  Cardiomediastinal silhouette appears normal.  No change is seen involving calcified granuloma in right lower lobe.  No pleural effusion or pneumothorax is noted. No acute pulmonary disease is noted.  IMPRESSION: No acute cardiopulmonary abnormality seen.   Original Report Authenticated By: Lupita Raider.,  M.D.     Discharge Medications     Medication List    STOP taking these medications       lisinopril 20 MG tablet  Commonly known as:  PRINIVIL,ZESTRIL      TAKE these  medications       amLODipine 5 MG tablet  Commonly known as:  NORVASC  Take 5 mg by mouth daily.     aspirin 81 MG tablet  Take 1 tablet (81 mg total) by mouth daily.     atorvastatin 40 MG tablet  Commonly known as:  LIPITOR  Take 1 tablet (40 mg total) by mouth daily.     glipiZIDE 5 MG 24 hr tablet  Commonly known as:  GLUCOTROL XL  Take 5 mg by mouth daily.     multivitamin tablet  Take 1 tablet by mouth daily.     nitroGLYCERIN 0.4 MG SL tablet  Commonly known as:  NITROSTAT  Place 1 tablet (0.4 mg total) under the tongue every 5 (five) minutes as needed for chest pain (up to 3 doses).     prasugrel 10 MG Tabs  Commonly known as:  EFFIENT  Take 1 tablet (10 mg total) by mouth daily.     sertraline 100 MG tablet  Commonly known as:  ZOLOFT  Take 100 mg by mouth daily.     zonisamide 100 MG capsule  Commonly known as:  ZONEGRAN  Take 200 mg by mouth daily.        Disposition   The patient will be discharged in stable condition to home. Discharge Orders   Future Appointments Provider Department Dept Phone   12/14/2012 10:10 AM [facility] Main Office [place]) [phone]   12/27/2012 8:00 AM Tristan Macadamia, NP [facility] Main Office [place]) [phone]   Future Orders Complete By Expires     Diet - low sodium heart healthy  As directed     Increase activity slowly  As directed     Comments:      No driving for 2 days. No lifting over 5 lbs for 1 week. No sexual activity for 1 week. Keep procedure site clean & dry. If you notice increased pain, swelling, bleeding or pus, call/return!  You may shower, but no soaking baths/hot tubs/pools for 1 week.   Tristan Baker has encouraged you to have good oral intake of fluids over the next several days to help flush your kidneys.      Follow-up Information   Follow up with [facility]. (Please have labwork on Friday morning 12/14/12 - lab opens at 7:30am)    Contact information:   [address] [place] [address] [phone]       Follow up with Tristan Fredrickson, NP. (12/27/12 at 8am)    Contact information:   [address] [place] [address] [phone]       Follow up with Zada Girt, MD. (As scheduled)    Contact information:   [address] [facility] [place] [address] [phone]       Follow up with Kari Baars, MD. (Make appointment with PCP to  discuss your diabetes control)    Contact information:   [address] [facility] [place] [address] [phone]         Duration of Discharge Encounter: Greater than 30 minutes including physician and PA time.  Signed, Ronie Spies PA-C 12/12/2012, 9:13 AM

## 2012-12-12 NOTE — Progress Notes (Signed)
CARDIAC REHAB PHASE I   PRE:  Rate/Rhythm: 72 SR    BP: sitting 147/76    SaO2:   MODE:  Ambulation: 1000 ft   POST:  Rate/Rhythm: 78 SR    BP: sitting 158/77     SaO2:   Tolerated well. Ed completed. Encouraged pt to increase exercise and decrease sugars (reg soft drinks, hard candy esp). Not interested in CRPII. Wife very supportive and encouraging.  [id number]   Elissa Lovett Encinal CES, ACSM 12/12/2012 9:17 AM

## 2012-12-12 NOTE — Progress Notes (Addendum)
   TELEMETRY: Reviewed telemetry pt in NSR: Filed Vitals:   12/11/12 1600 12/11/12 1950 12/12/12 0040 12/12/12 0523  BP: 152/102 148/67 155/81 120/51  Pulse: 63 68 61 62  Temp: 98 F (36.7 C) 98 F (36.7 C) 97.7 F (36.5 C) 98 F (36.7 C)  TempSrc: Oral Oral Oral Oral  Resp:   18   Height:      Weight:   200 lb 9.9 oz (91 kg)   SpO2: 97% 98% 97% 98%    Intake/Output Summary (Last 24 hours) at 12/12/12 0708 Last data filed at 12/11/12 1900  Gross per 24 hour  Intake 1208.28 ml  Output    900 ml  Net 308.28 ml    SUBJECTIVE Feels well. No chest pain. Breathing is good. Good urine output.  LABS: Basic Metabolic Panel:  Recent Labs  09/81/19 0435  NA 139  K 5.1  CL 105  CO2 26  GLUCOSE 138*  BUN 24*  CREATININE 1.79*  CALCIUM 9.7   CBC:  Recent Labs  12/12/12 0435  WBC 7.7  HGB 13.2  HCT 38.0*  MCV 93.1  PLT 171    Radiology/Studies:  Dg Chest 2 View  12/11/2012  *RADIOLOGY REPORT*  Clinical Data: Coronary artery disease, dyspnea  CHEST - 2 VIEW  Comparison: September 15, 2007.  Findings: Sternotomy wires are noted.  Cardiomediastinal silhouette appears normal.  No change is seen involving calcified granuloma in right lower lobe.  No pleural effusion or pneumothorax is noted. No acute pulmonary disease is noted.  IMPRESSION: No acute cardiopulmonary abnormality seen.   Original Report Authenticated By: Lupita Raider.,  M.D.    Ecg: 12-Dec-2012 05:27:15 [facility] ROUTINE RECORD Normal sinus rhythm Right bundle branch block Possible Lateral infarct , age undetermined Abnormal ECG  PHYSICAL EXAM General: Well developed, well nourished, in no acute distress. Head: Normal Neck: Negative for carotid bruits. JVD not elevated. Lungs: Clear bilaterally to auscultation without wheezes, rales, or rhonchi. Breathing is unlabored. Heart: RRR S1 S2 without murmurs, rubs, or gallops.  Abdomen: Soft, non-tender, non-distended with normoactive  bowel sounds.  Msk:  Strength and tone appears normal for age. Extremities: No clubbing, cyanosis or edema.  Distal pedal pulses are 2+ and equal bilaterally. No groin hematoma. Neuro: Alert and oriented X 3. Moves all extremities spontaneously. Psych:  Responds to questions appropriately with a normal affect.  ASSESSMENT AND PLAN: 1. CAD s/p stent of SVG to OM. Patent RIMA and LIMA grafts. On ASA and Effient for one year. 2. CKD stage 3. Baseline creatinine 1.7 in December 2012. 1.5 before procedure. 1.79 today. Encourage po hydration. Need to check BMET on Friday. Patient has follow up with Dr. Caryn Section on May 1. Will hold ACEi until creatinine stable. 3. HTN 4. Hyperlipidemia 5. DM 6. RBBB  Plan: discharge home today. Follow up Bmet Friday. Office visit in 2 weeks with Lawson Fiscal.  Principal Problem:   H/O class III angina pectoris Active Problems:   Ischemic heart disease   HTN (hypertension)   Obesity   CAD (coronary artery disease) of artery bypass graft    Signed, Skyelynn Rambeau [place] MD,FACC 12/12/2012 7:10 AM

## 2012-12-12 NOTE — Discharge Summary (Signed)
Patient seen and examined and history reviewed. Agree with above findings and plan. See my earlier rounding note.  Tristan Baker [facility] 12/12/2012 1:04 PM

## 2012-12-12 NOTE — Care Management Note (Signed)
    Page 1 of 1   12/12/2012     10:27:49 AM   CARE MANAGEMENT NOTE 12/12/2012  Patient:  Tristan Baker, Tristan Baker   Account Number:  [id number]  Date Initiated:  12/12/2012  Documentation initiated by:  Oletta Cohn  Subjective/Objective Assessment:   72yo male admitted with cp     Action/Plan:   home with wife/ CM to verify medication stock with pt pharmacy   Anticipated DC Date:  12/12/2012   Anticipated DC Plan:  HOME/SELF CARE         Choice offered to / List presented to:             Status of service:   Medicare Important Message given?   (If response is "NO", the following Medicare IM given date fields will be blank) Date Medicare IM given:   Date Additional Medicare IM given:    Discharge Disposition:    Per UR Regulation:    If discussed at Long Length of Stay Meetings, dates discussed:    Comments:  12/12/12 Oletta Cohn, RN, BSN, NCM [phone] CM spoke with pt regarding Effient medication.  Pt states that his pharmacy is [facility].  CM called pharmacy to confirm medication in stock.  Pt notified.

## 2012-12-14 ENCOUNTER — Telehealth: Payer: Self-pay | Admitting: Cardiology

## 2012-12-14 ENCOUNTER — Other Ambulatory Visit (INDEPENDENT_AMBULATORY_CARE_PROVIDER_SITE_OTHER): Payer: Medicare Other

## 2012-12-14 DIAGNOSIS — N189 Chronic kidney disease, unspecified: Secondary | ICD-10-CM

## 2012-12-14 LAB — BASIC METABOLIC PANEL
Calcium: 9.3 mg/dL (ref 8.4–10.5)
GFR: 41.75 mL/min — ABNORMAL LOW (ref >60.00)
Glucose, Bld: 218 mg/dL — ABNORMAL HIGH (ref 70–99)
Sodium: 135 mEq/L (ref 135–145)

## 2012-12-14 NOTE — Telephone Encounter (Signed)
Called patient with post hosp bmet lab results. Feeling well without complaints. Has all medications. Aware of appointment with Dawayne Patricia NP.

## 2012-12-14 NOTE — Telephone Encounter (Signed)
**  TCM** Attempted to contact patient through mobile, home & work.  Unable to LM on home phone.  LM on mobile to return call.  Patient came in on 4/4 for labs.

## 2012-12-27 ENCOUNTER — Ambulatory Visit: Payer: Medicare Other | Admitting: Nurse Practitioner

## 2013-01-09 ENCOUNTER — Ambulatory Visit: Payer: Medicare Other | Admitting: Nurse Practitioner

## 2013-01-11 ENCOUNTER — Other Ambulatory Visit: Payer: Self-pay | Admitting: Nurse Practitioner

## 2013-01-14 ENCOUNTER — Encounter: Payer: Self-pay | Admitting: Physician Assistant

## 2013-01-14 ENCOUNTER — Telehealth: Payer: Self-pay | Admitting: Cardiology

## 2013-01-14 ENCOUNTER — Ambulatory Visit (INDEPENDENT_AMBULATORY_CARE_PROVIDER_SITE_OTHER): Payer: Medicare Other | Admitting: Physician Assistant

## 2013-01-14 VITALS — BP 159/64 | HR 73 | Ht 66.0 in | Wt 201.4 lb

## 2013-01-14 DIAGNOSIS — I10 Essential (primary) hypertension: Secondary | ICD-10-CM

## 2013-01-14 MED ORDER — METOPROLOL SUCCINATE ER 25 MG PO TB24: 25.0000 mg | ORAL_TABLET | Freq: Every day | ORAL | Status: DC

## 2013-01-14 MED ORDER — ATORVASTATIN CALCIUM 40 MG PO TABS: 40.0000 mg | ORAL_TABLET | Freq: Every day | ORAL | Status: DC

## 2013-01-14 NOTE — Assessment & Plan Note (Signed)
Checked and bmet today

## 2013-01-14 NOTE — Telephone Encounter (Signed)
Records Rec From [facility], gave to [facility]  01/14/13/KM

## 2013-01-14 NOTE — Assessment & Plan Note (Signed)
Patient underwent stenting of an SVG to the OM on 12/11/12. I will start Toprol 25 mg daily. He is doing well.

## 2013-01-14 NOTE — Telephone Encounter (Signed)
ROI faxed to Dr.[phone] & Dr.Richard [phone] 01/14/13/KM

## 2013-01-14 NOTE — Assessment & Plan Note (Signed)
Blood pressure is up. Lisinopril was reinitiated by Dr. Caryn Section and Toprol will be added today.

## 2013-01-14 NOTE — Progress Notes (Signed)
HPI: This is a 72 year old male patient of Dr. Jens Som who has a history of a prior MI and CABG in 1996, diabetes mellitus, hypertension, and obesity, and has DVT. He was admitted through the office with recurrent angina and underwent cardiac catheterization on 12/11/12 he underwent drug-eluting stent of the SVG to the OM and had a patent LIMA to the LAD, and patent RIMA to the PDA.2-D echo showed ejection fraction to be 45%. His baseline creatinine was 1.7 in 2012, preprocedure was 1.5 and post procedure it was 1.79. His ACE inhibitor was held but he saw Dr. Caryn Section on May 1 who asked him to resume his lisinopril at half the dose (10 mg daily). His heart rate was in the 60s so beta blocker was not initiated but recommended on followup.   Patient is doing well post hospitalization. He denies any chest pain, palpitations, dyspnea, dyspnea on exertion, dizziness, or presyncope. He is not motivated and at all to change his diet.    Allergies: -- Choline Fenofibrate   Current Outpatient Prescriptions on File Prior to Visit: amLODipine (NORVASC) 5 MG tablet, Take 5 mg by mouth daily.  , Disp: , Rfl:   aspirin 81 MG tablet, Take 1 tablet (81 mg total) by mouth daily., Disp: , Rfl:  atorvastatin (LIPITOR) 40 MG tablet, TAKE 1 TABLET BY MOUTH EVERY DAY, Disp: 90 tablet, Rfl: 1 glipiZIDE (GLUCOTROL XL) 5 MG 24 hr tablet, Take 5 mg by mouth daily.  , Disp: , Rfl:  Multiple Vitamin (MULTIVITAMIN) tablet, Take 1 tablet by mouth daily.  , Disp: , Rfl:  nitroGLYCERIN (NITROSTAT) 0.4 MG SL tablet, Place 1 tablet (0.4 mg total) under the tongue every 5 (five) minutes as needed for chest pain (up to 3 doses)., Disp: , Rfl:  prasugrel (EFFIENT) 10 MG TABS, Take 1 tablet (10 mg total) by mouth daily., Disp: 30 tablet, Rfl: 10 sertraline (ZOLOFT) 100 MG tablet, Take 100 mg by mouth daily.  , Disp: , Rfl:  zonisamide (ZONEGRAN) 100 MG capsule, Take 200 mg by mouth daily.  , Disp: , Rfl:   No current facility-administered  medications on file prior to visit.   Past Medical History:   Hypertension                                                   Comment:INTERMITTENT NONCOMPLIANCE   Obesity                                                      OA (osteoarthritis)                                          DVT (deep venous thrombosis)                                 MI, old  1994           Comment:POSTERIOR M.I.   RBBB (right bundle branch block)                             Hyperlipidemia                                               Anxiety                                                      Depression                                                   Renal insufficiency, mild                                    Decreased hearing                                            Coronary artery disease                                        Comment:a. 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 b. s/p               DES of SVG-OM 12/11/12.   Diabetes mellitus                                              Comment:type 2    LV dysfunction                                                 Comment:EF 45% by echo 12/11/12  Past Surgical History:   CARDIAC CATHETERIZATION                          01/25/1995      Comment:NORMAL   CORONARY ARTERY BYPASS GRAFT                     01/25/1995    KNEE SURGERY                                                  CARDIOVASCULAR STRESS TEST  06/16/2009      Comment:EF 43%, INFERIOR LATERAL HYPOKINESIA CONSISTENT              WITH PREVIOUS INFARCTION, UNCHANGED FROM 2008.               NO EVIDENCE OF ISCHEMIA   ROTATOR CUFF REPAIR                                             Comment:multiple   ELBOW SURGERY                                                Review of patient's family history indicates:   Stroke                         Mother                   Heart disease                  Father                   Social History    Marital Status: Married             Spouse Name:                      Years of Education:                 Number of children:             Occupational History   None on file  Social History Main Topics   Smoking Status: Never Smoker                     Smokeless Status: Never Used                       Alcohol Use: No             Drug Use: No             Sexual Activity: Not Currently      Other Topics            Concern   None on file  Social History Narrative   None on file    ROS:see history of present illness otherwise negative   PHYSICAL EXAM: Well-nournished, in no acute distress. Neck: No JVD, HJR, Bruit, or thyroid enlargement  Lungs: No tachypnea, clear without wheezing, rales, or rhonchi  Cardiovascular: RRR, PMI not displaced, heart sounds normal, no murmurs, gallops, bruit, thrill, or heave.  Abdomen: BS normal. Soft without organomegaly, masses, lesions or tenderness.  Extremities:right groin without hematoma or hemorrhage at catheter site otherwise lower extremities without cyanosis, clubbing or edema. Good distal pulses bilateral  SKin: Warm, no lesions or rashes   Musculoskeletal: No deformities  Neuro: no focal signs  BP 159/64  Pulse 73  Ht 5\' 6"  (1.676 m)  Wt 201 lb 6.4 oz (91.354 kg)  BMI 32.52 kg/m2   ZOX:WRUEAV sinus rhythm at 86 beats per minute with right bundle branch block

## 2013-01-14 NOTE — Patient Instructions (Addendum)
**Note De-Identified Tristan Baker Obfuscation** Your physician has recommended you make the following change in your medication: start taking Metoprolol 25 mg daily  Your physician recommends that you return for lab work in: today  Your physician recommends that you schedule a follow-up appointment in: 1 to 2 months  Your provider recommends that you start a low fat diet, please see handout given to you at todays visit  Please take most all of your medications in the morning and take atorvastatin at bedtime each night.

## 2013-01-15 ENCOUNTER — Telehealth: Payer: Self-pay | Admitting: Cardiology

## 2013-01-15 LAB — BASIC METABOLIC PANEL
Calcium: 9.2 mg/dL (ref 8.4–10.5)
Chloride: 104 mEq/L (ref 96–112)
Creatinine, Ser: 1.9 mg/dL — ABNORMAL HIGH (ref 0.4–1.5)
Sodium: 134 mEq/L — ABNORMAL LOW (ref 135–145)

## 2013-01-15 NOTE — Telephone Encounter (Signed)
Records rec From [facility] to [facility] 01/15/13/KM

## 2013-01-23 ENCOUNTER — Other Ambulatory Visit: Payer: Self-pay | Admitting: *Deleted

## 2013-01-23 DIAGNOSIS — N289 Disorder of kidney and ureter, unspecified: Secondary | ICD-10-CM

## 2013-01-30 ENCOUNTER — Other Ambulatory Visit: Payer: Medicare Other

## 2013-02-01 ENCOUNTER — Other Ambulatory Visit (INDEPENDENT_AMBULATORY_CARE_PROVIDER_SITE_OTHER): Payer: Medicare Other

## 2013-02-01 DIAGNOSIS — N289 Disorder of kidney and ureter, unspecified: Secondary | ICD-10-CM

## 2013-02-01 LAB — BASIC METABOLIC PANEL
GFR: 47.06 mL/min — ABNORMAL LOW (ref >60.00)
Glucose, Bld: 146 mg/dL — ABNORMAL HIGH (ref 70–99)
Potassium: 4.9 mEq/L (ref 3.5–5.1)
Sodium: 139 mEq/L (ref 135–145)

## 2013-02-06 ENCOUNTER — Telehealth: Payer: Self-pay | Admitting: *Deleted

## 2013-02-06 NOTE — Telephone Encounter (Signed)
Message copied by Burnell Blanks on Wed Feb 06, 2013  9:00 AM ------      Message from: Prescott Gum      Created: Wed Feb 06, 2013  8:09 AM       Kidney function improved. ------

## 2013-02-06 NOTE — Telephone Encounter (Signed)
Advised patient of lab results and sent to Dr Caryn Section as requested

## 2013-03-11 ENCOUNTER — Encounter: Payer: Medicare Other | Admitting: Cardiology

## 2013-03-11 NOTE — Progress Notes (Signed)
HPI: Pleasant male for fu of CAD; history of a prior MI and CABG in 1996, diabetes mellitus, hypertension, and obesity, and DVT. He was admitted through the office with recurrent angina and underwent cardiac catheterization on 12/11/12; he underwent drug-eluting stent of the SVG to the OM and had a patent LIMA to the LAD, and patent RIMA to the PDA.2-D; echo showed ejection fraction to be 45%. Also with renal insufficiency. Since he was last seen,    Current Outpatient Prescriptions  Medication Sig Dispense Refill  . amLODipine (NORVASC) 5 MG tablet Take 5 mg by mouth daily.        Marland Kitchen aspirin 81 MG tablet Take 1 tablet (81 mg total) by mouth daily.      Marland Kitchen atorvastatin (LIPITOR) 40 MG tablet Take 1 tablet (40 mg total) by mouth daily.  90 tablet  1  . glipiZIDE (GLUCOTROL XL) 5 MG 24 hr tablet Take 5 mg by mouth daily.        Marland Kitchen lisinopril (PRINIVIL,ZESTRIL) 20 MG tablet Take 10 mg by mouth daily.      . metoprolol succinate (TOPROL-XL) 25 MG 24 hr tablet Take 1 tablet (25 mg total) by mouth daily.  90 tablet  1  . Multiple Vitamin (MULTIVITAMIN) tablet Take 1 tablet by mouth daily.        . nitroGLYCERIN (NITROSTAT) 0.4 MG SL tablet Place 1 tablet (0.4 mg total) under the tongue every 5 (five) minutes as needed for chest pain (up to 3 doses).      . prasugrel (EFFIENT) 10 MG TABS Take 1 tablet (10 mg total) by mouth daily.  30 tablet  10  . sertraline (ZOLOFT) 100 MG tablet Take 100 mg by mouth daily.        Marland Kitchen zonisamide (ZONEGRAN) 100 MG capsule Take 200 mg by mouth daily.         No current facility-administered medications for this visit.     Past Medical History  Diagnosis Date  . Hypertension     INTERMITTENT NONCOMPLIANCE  . Obesity   . OA (osteoarthritis)   . DVT (deep venous thrombosis)   . MI, old 58    POSTERIOR M.I.  . RBBB (right bundle branch block)   . Hyperlipidemia   . Anxiety   . Depression   . Renal insufficiency, mild   . Decreased hearing   . Coronary artery  disease     a. 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 b. s/p DES of SVG-OM 12/11/12.  . Diabetes mellitus     type 2   . LV dysfunction     EF 45% by echo 12/11/12    Past Surgical History  Procedure Laterality Date  . Cardiac catheterization  01/25/1995    NORMAL  . Coronary artery bypass graft  01/25/1995  . Knee surgery    . Cardiovascular stress test  06/16/2009    EF 43%, INFERIOR LATERAL HYPOKINESIA CONSISTENT WITH PREVIOUS INFARCTION, UNCHANGED FROM 2008. NO EVIDENCE OF ISCHEMIA  . Rotator cuff repair      multiple  . Elbow surgery      History   Social History  . Marital Status: Married    Spouse Name: N/A    Number of Children: N/A  . Years of Education: N/A   Occupational History  . Not on file.   Social History Main Topics  . Smoking status: Never Smoker   . Smokeless tobacco: Never Used  . Alcohol Use: No  .  Drug Use: No  . Sexually Active: Not Currently   Other Topics Concern  . Not on file   Social History Narrative  . No narrative on file    ROS: no fevers or chills, productive cough, hemoptysis, dysphasia, odynophagia, melena, hematochezia, dysuria, hematuria, rash, seizure activity, orthopnea, PND, pedal edema, claudication. Remaining systems are negative.  Physical Exam: Well-developed well-nourished in no acute distress.  Skin is warm and dry.  HEENT is normal.  Neck is supple.  Chest is clear to auscultation with normal expansion.  Cardiovascular exam is regular rate and rhythm.  Abdominal exam nontender or distended. No masses palpated. Extremities show no edema. neuro grossly intact  ECG     This encounter was created in error - please disregard.

## 2013-03-26 ENCOUNTER — Other Ambulatory Visit: Payer: Self-pay | Admitting: *Deleted

## 2013-03-26 MED ORDER — PRASUGREL HCL 10 MG PO TABS: 10.0000 mg | ORAL_TABLET | Freq: Every day | ORAL | Status: DC

## 2013-03-26 NOTE — Telephone Encounter (Signed)
Refill complete 

## 2013-04-22 ENCOUNTER — Encounter: Payer: Self-pay | Admitting: Cardiology

## 2013-06-18 ENCOUNTER — Ambulatory Visit (INDEPENDENT_AMBULATORY_CARE_PROVIDER_SITE_OTHER): Payer: Medicare Other | Admitting: Cardiology

## 2013-06-18 ENCOUNTER — Encounter: Payer: Self-pay | Admitting: Cardiology

## 2013-06-18 VITALS — BP 130/70 | HR 63 | Ht 66.0 in | Wt 200.0 lb

## 2013-06-18 DIAGNOSIS — I25810 Atherosclerosis of coronary artery bypass graft(s) without angina pectoris: Secondary | ICD-10-CM

## 2013-06-18 DIAGNOSIS — I10 Essential (primary) hypertension: Secondary | ICD-10-CM

## 2013-06-18 DIAGNOSIS — E785 Hyperlipidemia, unspecified: Secondary | ICD-10-CM

## 2013-06-18 DIAGNOSIS — I259 Chronic ischemic heart disease, unspecified: Secondary | ICD-10-CM

## 2013-06-18 NOTE — Patient Instructions (Addendum)
Your physician wants you to follow-up in: 6 MONTHS WITH DR CRENSHAW You will receive a reminder letter in the mail two months in advance. If you don't receive a letter, please call our office to schedule the follow-up appointment.  

## 2013-06-18 NOTE — Progress Notes (Signed)
HPI: HPI: FU CAD; history of a prior MI and CABG in 1996, diabetes mellitus, hypertension, and obesity, and has DVT. Cath 12/11/12: he underwent drug-eluting stent of the SVG to the OM and had a patent LIMA to the LAD, and patent RIMA to the PDA. 2-D echo showed ejection fraction to be 45%. Since she was last seen the patient has dyspnea with more extreme activities but not with routine activities. It is relieved with rest. It is not associated with chest pain. There is no orthopnea, PND or pedal edema. There is no syncope or palpitations. There is no exertional chest pain.    Current Outpatient Prescriptions  Medication Sig Dispense Refill  . amLODipine (NORVASC) 5 MG tablet Take 5 mg by mouth daily.        Marland Kitchen aspirin 81 MG tablet Take 1 tablet (81 mg total) by mouth daily.      Marland Kitchen atorvastatin (LIPITOR) 40 MG tablet Take 1 tablet (40 mg total) by mouth daily.  90 tablet  1  . glipiZIDE (GLUCOTROL XL) 5 MG 24 hr tablet Take 5 mg by mouth daily.        Marland Kitchen lisinopril (PRINIVIL,ZESTRIL) 5 MG tablet Take 5 mg by mouth daily.      . Multiple Vitamin (MULTIVITAMIN) tablet Take 1 tablet by mouth daily.        . nitroGLYCERIN (NITROSTAT) 0.4 MG SL tablet Place 1 tablet (0.4 mg total) under the tongue every 5 (five) minutes as needed for chest pain (up to 3 doses).      . prasugrel (EFFIENT) 10 MG TABS Take 1 tablet (10 mg total) by mouth daily.  90 tablet  3  . sertraline (ZOLOFT) 100 MG tablet Take 100 mg by mouth daily.        Marland Kitchen zonisamide (ZONEGRAN) 100 MG capsule Take 200 mg by mouth daily.         No current facility-administered medications for this visit.     Past Medical History  Diagnosis Date  . Hypertension     INTERMITTENT NONCOMPLIANCE  . Obesity   . OA (osteoarthritis)   . DVT (deep venous thrombosis)   . MI, old 81    POSTERIOR M.I.  . RBBB (right bundle branch block)   . Hyperlipidemia   . Anxiety   . Depression   . Renal insufficiency, mild   . Decreased hearing     . Coronary artery disease     a. 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 b. s/p DES of SVG-OM 12/11/12.  . Diabetes mellitus     type 2   . LV dysfunction     EF 45% by echo 12/11/12    Past Surgical History  Procedure Laterality Date  . Cardiac catheterization  01/25/1995    NORMAL  . Coronary artery bypass graft  01/25/1995  . Knee surgery    . Cardiovascular stress test  06/16/2009    EF 43%, INFERIOR LATERAL HYPOKINESIA CONSISTENT WITH PREVIOUS INFARCTION, UNCHANGED FROM 2008. NO EVIDENCE OF ISCHEMIA  . Rotator cuff repair      multiple  . Elbow surgery      History   Social History  . Marital Status: Married    Spouse Name: N/A    Number of Children: N/A  . Years of Education: N/A   Occupational History  . Not on file.   Social History Main Topics  . Smoking status: Never Smoker   . Smokeless tobacco: Never  Used  . Alcohol Use: No  . Drug Use: No  . Sexual Activity: Not Currently   Other Topics Concern  . Not on file   Social History Narrative  . No narrative on file    ROS: no fevers or chills, productive cough, hemoptysis, dysphasia, odynophagia, melena, hematochezia, dysuria, hematuria, rash, seizure activity, orthopnea, PND, pedal edema, claudication. Remaining systems are negative.  Physical Exam: Well-developed well-nourished in no acute distress.  Skin is warm and dry.  HEENT is normal.  Neck is supple.  Chest is clear to auscultation with normal expansion.  Cardiovascular exam is regular rate and rhythm.  Abdominal exam nontender or distended. No masses palpated. Extremities show trace edema. neuro grossly intact  ECG sinus rhythm at a rate of 63. Right bundle branch block. Prior septal infarct.

## 2013-06-18 NOTE — Assessment & Plan Note (Signed)
Continue statin. 

## 2013-06-18 NOTE — Assessment & Plan Note (Signed)
Continue aspirin and statin. Discontinue prasugrel 12 months after last stent.

## 2013-06-18 NOTE — Assessment & Plan Note (Deleted)
Continue ACE inhibitor and beta blocker for mild ischemic cardiomyopathy.

## 2013-06-18 NOTE — Assessment & Plan Note (Signed)
Continue present blood pressure medications. 

## 2013-07-16 ENCOUNTER — Encounter: Payer: Self-pay | Admitting: Cardiology

## 2013-08-05 ENCOUNTER — Other Ambulatory Visit: Payer: Self-pay | Admitting: *Deleted

## 2013-08-05 MED ORDER — METOPROLOL SUCCINATE ER 25 MG PO TB24: 25.0000 mg | ORAL_TABLET | Freq: Every day | ORAL | Status: DC

## 2013-10-28 ENCOUNTER — Other Ambulatory Visit: Payer: Self-pay | Admitting: Diagnostic Neuroimaging

## 2014-01-16 ENCOUNTER — Encounter: Payer: Medicare Other | Admitting: Cardiology

## 2014-01-16 NOTE — Progress Notes (Signed)
HPI: FU CAD; history of a prior MI and CABG in 1996. Cath 12/11/12: he underwent drug-eluting stent of the SVG to the OM and had a patent LIMA to the LAD, and patent RIMA to the PDA. 2-D echo showed ejection fraction to be 45%. Since he was last seen the patient has dyspnea with more extreme activities but not with routine activities. It is relieved with rest. It is not associated with chest pain. There is no orthopnea, PND or pedal edema. There is no syncope or palpitations. There is no exertional chest pain.   Current Outpatient Prescriptions  Medication Sig Dispense Refill  . amLODipine (NORVASC) 5 MG tablet Take 5 mg by mouth daily.        Marland Kitchen. aspirin 81 MG tablet Take 1 tablet (81 mg total) by mouth daily.      Marland Kitchen. atorvastatin (LIPITOR) 40 MG tablet Take 1 tablet (40 mg total) by mouth daily.  90 tablet  1  . glipiZIDE (GLUCOTROL XL) 5 MG 24 hr tablet Take 5 mg by mouth daily.        Marland Kitchen. lisinopril (PRINIVIL,ZESTRIL) 5 MG tablet Take 5 mg by mouth daily.      . metoprolol succinate (TOPROL-XL) 25 MG 24 hr tablet Take 1 tablet (25 mg total) by mouth daily.  90 tablet  1  . Multiple Vitamin (MULTIVITAMIN) tablet Take 1 tablet by mouth daily.        . nitroGLYCERIN (NITROSTAT) 0.4 MG SL tablet Place 1 tablet (0.4 mg total) under the tongue every 5 (five) minutes as needed for chest pain (up to 3 doses).      . prasugrel (EFFIENT) 10 MG TABS Take 1 tablet (10 mg total) by mouth daily.  90 tablet  3  . sertraline (ZOLOFT) 100 MG tablet Take 100 mg by mouth daily.        Marland Kitchen. zonisamide (ZONEGRAN) 100 MG capsule TAKE 2 CAPSULES BY MOUTH AT BEDTIME  60 capsule  1   No current facility-administered medications for this visit.     Past Medical History  Diagnosis Date  . Hypertension     INTERMITTENT NONCOMPLIANCE  . Obesity   . OA (osteoarthritis)   . DVT (deep venous thrombosis)   . MI, old 651994    POSTERIOR M.I.  . RBBB (right bundle branch block)   . Hyperlipidemia   . Anxiety   .  Depression   . Renal insufficiency, mild   . Decreased hearing   . Coronary artery disease     a. 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 b. s/p DES of SVG-OM 12/11/12.  . Diabetes mellitus     type 2   . LV dysfunction     EF 45% by echo 12/11/12    Past Surgical History  Procedure Laterality Date  . Cardiac catheterization  01/25/1995    NORMAL  . Coronary artery bypass graft  01/25/1995  . Knee surgery    . Cardiovascular stress test  06/16/2009    EF 43%, INFERIOR LATERAL HYPOKINESIA CONSISTENT WITH PREVIOUS INFARCTION, UNCHANGED FROM 2008. NO EVIDENCE OF ISCHEMIA  . Rotator cuff repair      multiple  . Elbow surgery      History   Social History  . Marital Status: Married    Spouse Name: N/A    Number of Children: N/A  . Years of Education: N/A   Occupational History  . Not on file.   Social History Main  Topics  . Smoking status: Never Smoker   . Smokeless tobacco: Never Used  . Alcohol Use: No  . Drug Use: No  . Sexual Activity: Not Currently   Other Topics Concern  . Not on file   Social History Narrative  . No narrative on file    ROS: no fevers or chills, productive cough, hemoptysis, dysphasia, odynophagia, melena, hematochezia, dysuria, hematuria, rash, seizure activity, orthopnea, PND, pedal edema, claudication. Remaining systems are negative.  Physical Exam: Well-developed well-nourished in no acute distress.  Skin is warm and dry.  HEENT is normal.  Neck is supple.  Chest is clear to auscultation with normal expansion.  Cardiovascular exam is regular rate and rhythm.  Abdominal exam nontender or distended. No masses palpated. Extremities show no edema. neuro grossly intact  ECG     This encounter was created in error - please disregard.

## 2014-01-21 ENCOUNTER — Other Ambulatory Visit: Payer: Self-pay

## 2014-01-21 MED ORDER — ATORVASTATIN CALCIUM 40 MG PO TABS: 40.0000 mg | ORAL_TABLET | Freq: Every day | ORAL | Status: DC

## 2014-02-10 ENCOUNTER — Other Ambulatory Visit: Payer: Self-pay | Admitting: Cardiology

## 2014-02-11 ENCOUNTER — Other Ambulatory Visit: Payer: Self-pay

## 2014-02-11 MED ORDER — METOPROLOL SUCCINATE ER 25 MG PO TB24: 25.0000 mg | ORAL_TABLET | Freq: Every day | ORAL | Status: DC

## 2014-03-19 IMAGING — CR DG CHEST 2V
2 series · 2 of 2 positions shown · non-contrast
Comparison: September 15, 2007.

CLINICAL DATA: Coronary artery disease, dyspnea

CHEST - 2 VIEW

[w chest pa]
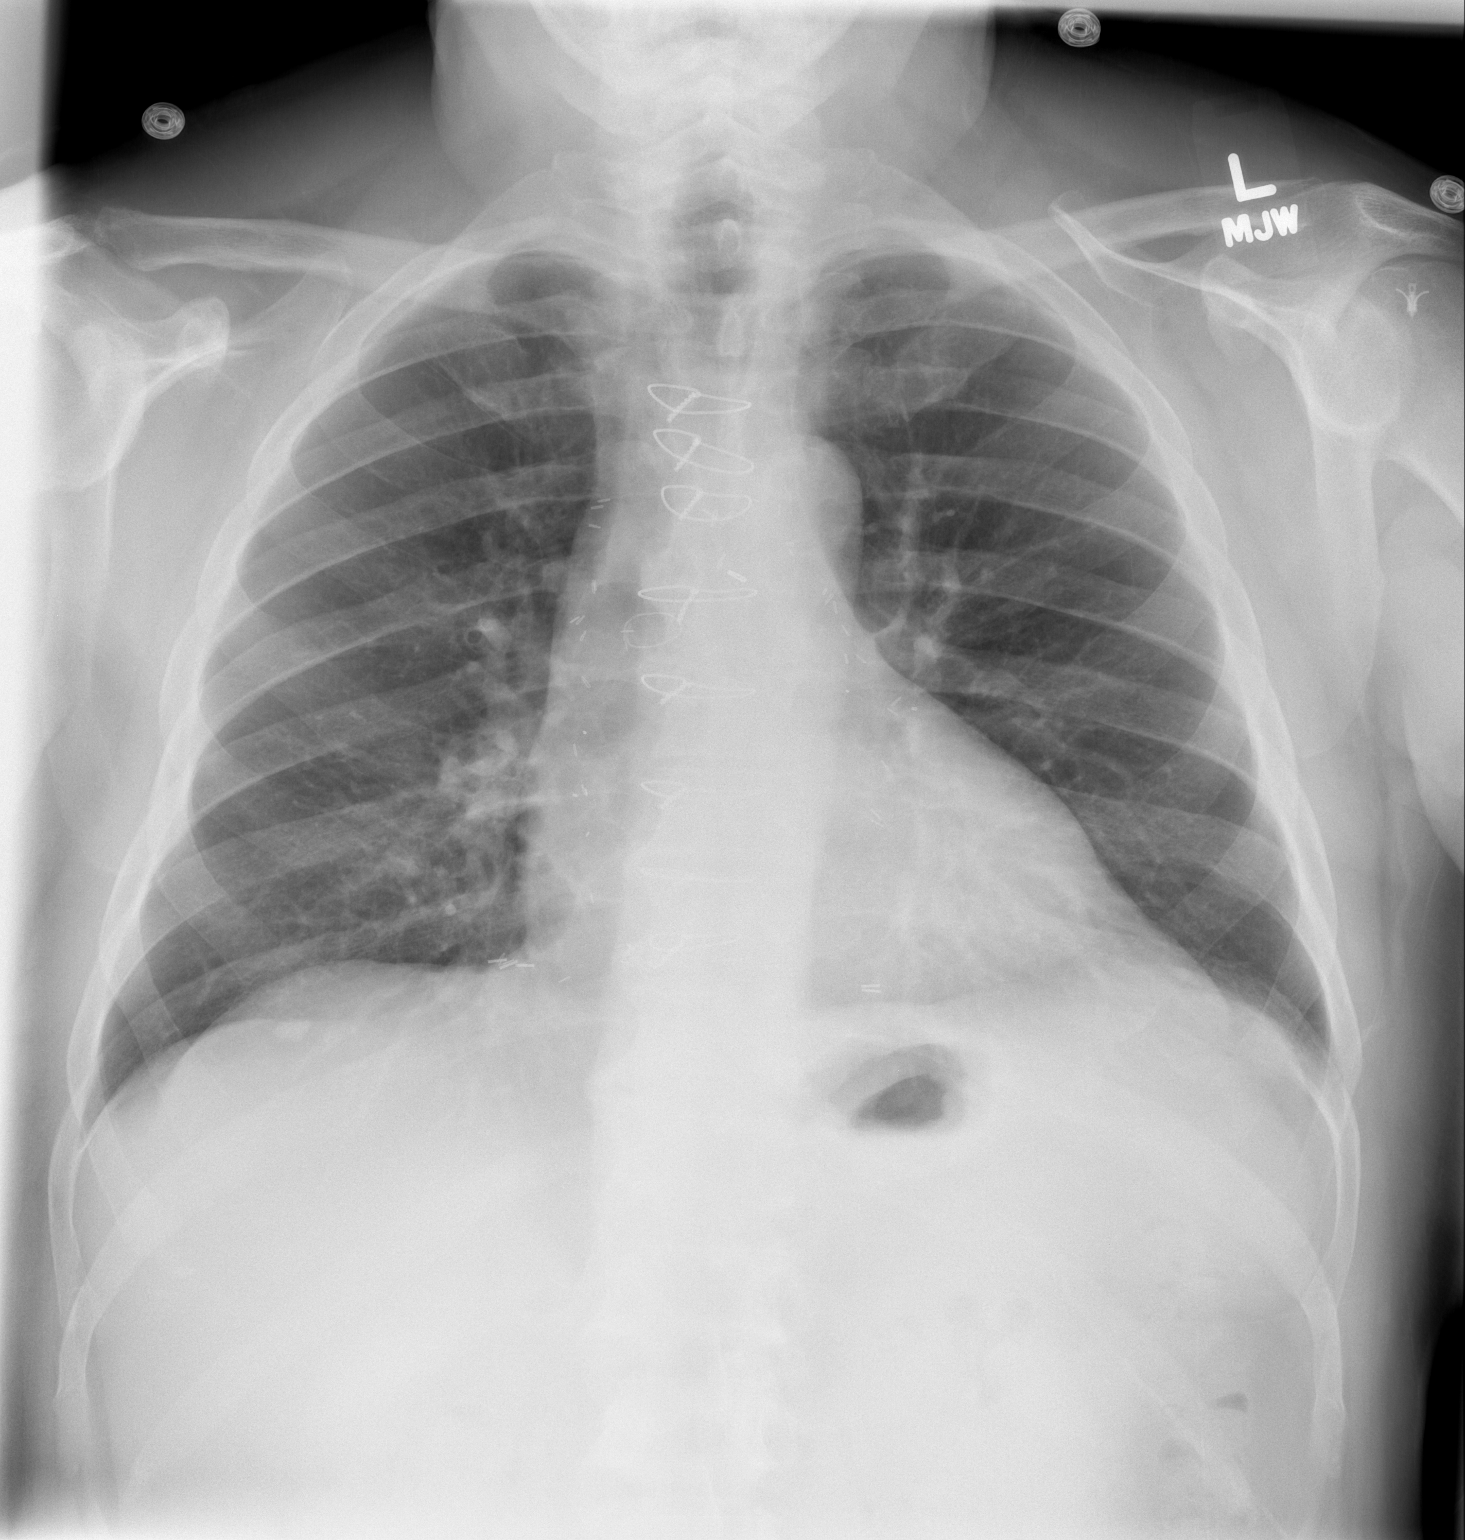

[w chest lat]
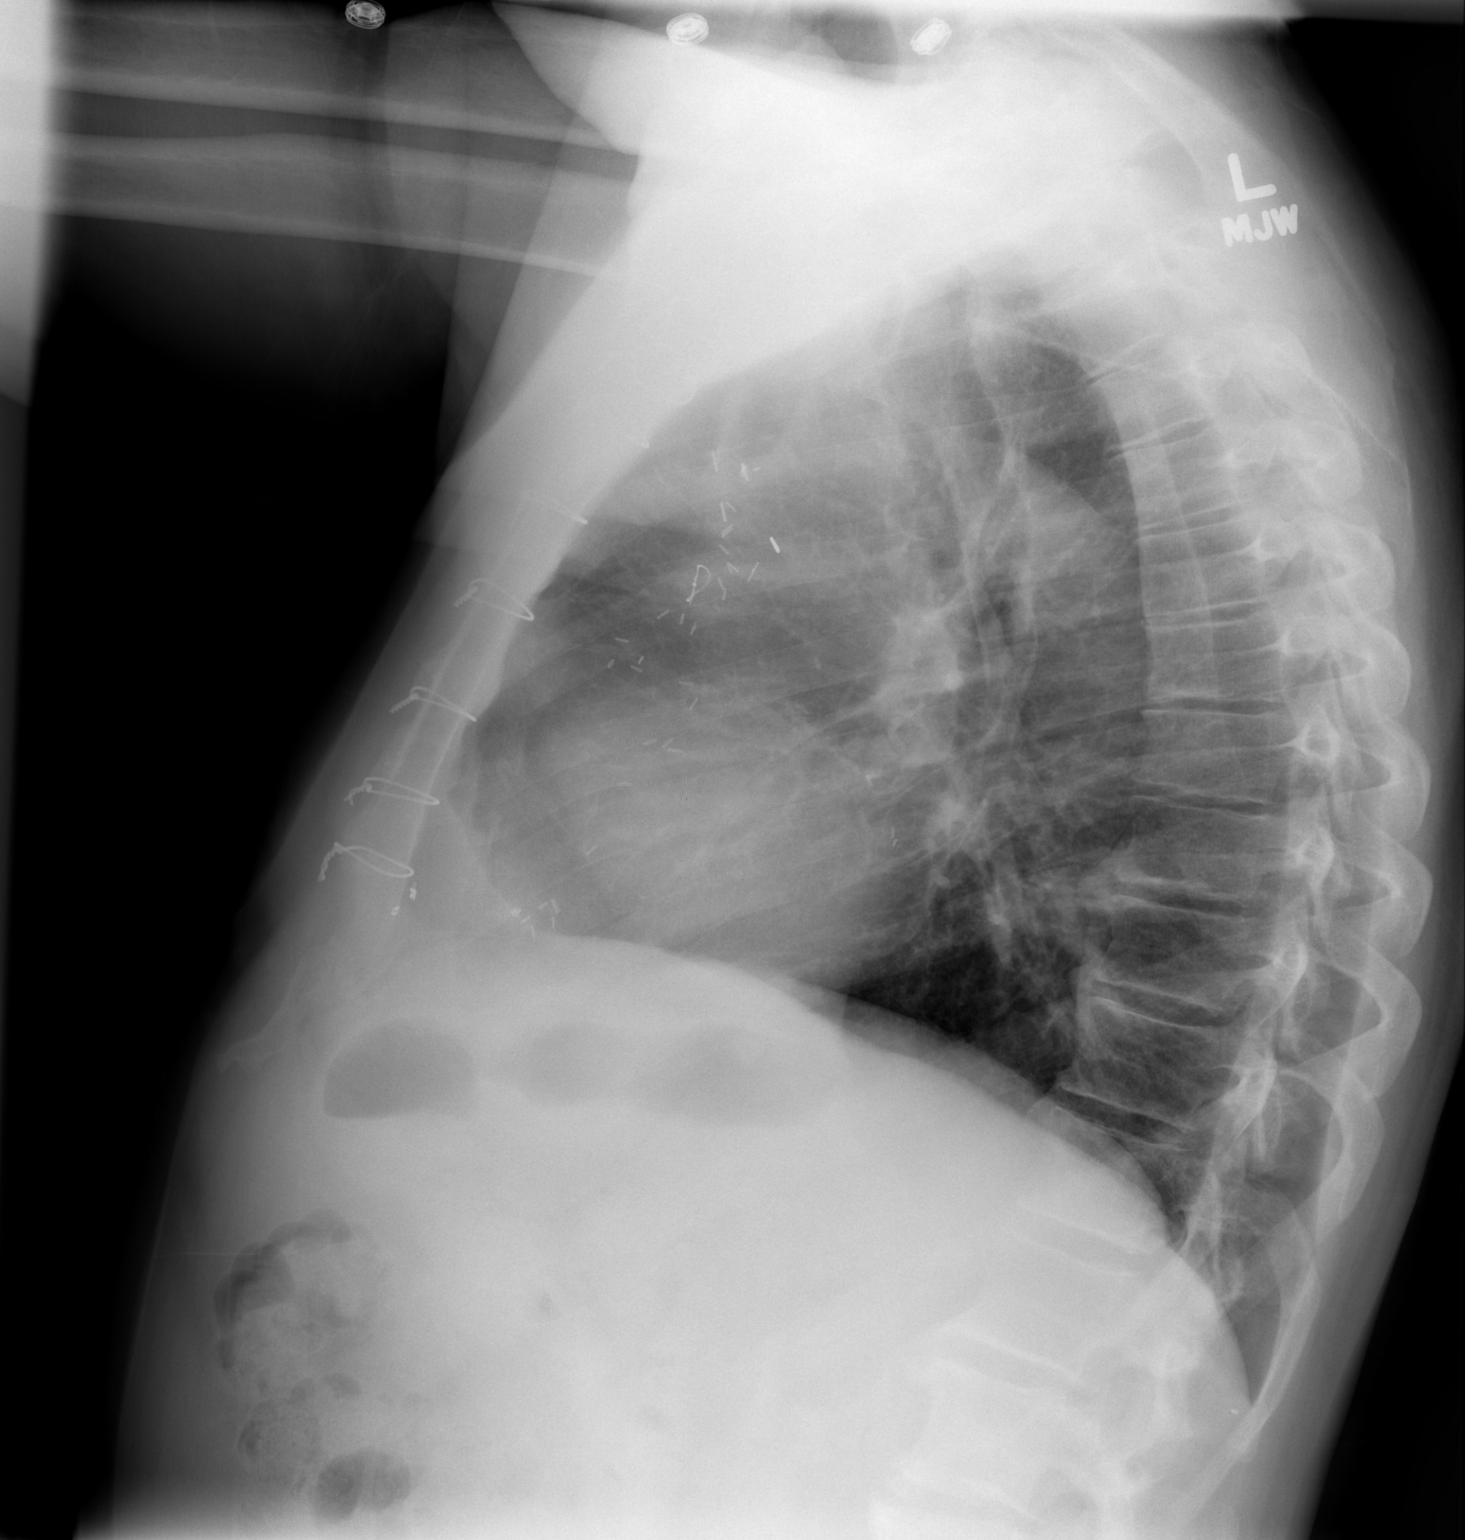

[2 of 2 positions shown; findings below may reference images not displayed]

FINDINGS: Sternotomy wires are noted.  Cardiomediastinal silhouette
appears normal.  No change is seen involving calcified granuloma in
right lower lobe.  No pleural effusion or pneumothorax is noted.
No acute pulmonary disease is noted.
IMPRESSION: No acute cardiopulmonary abnormality seen.

## 2014-03-27 ENCOUNTER — Encounter: Payer: Self-pay | Admitting: Cardiology

## 2014-03-27 ENCOUNTER — Encounter: Payer: Self-pay | Admitting: *Deleted

## 2014-03-27 ENCOUNTER — Ambulatory Visit (INDEPENDENT_AMBULATORY_CARE_PROVIDER_SITE_OTHER): Payer: Medicare Other | Admitting: Cardiology

## 2014-03-27 ENCOUNTER — Telehealth: Payer: Self-pay | Admitting: *Deleted

## 2014-03-27 VITALS — BP 148/76 | HR 76 | Ht 68.0 in | Wt 198.1 lb

## 2014-03-27 DIAGNOSIS — I25810 Atherosclerosis of coronary artery bypass graft(s) without angina pectoris: Secondary | ICD-10-CM

## 2014-03-27 NOTE — Patient Instructions (Signed)
Your physician wants you to follow-up in: ONE YEAR WITH DR Shelda PalRENSHAW You will receive a reminder letter in the mail two months in advance. If you don't receive a letter, please call our office to schedule the follow-up appointment.   STOP EFFIENT  Your physician has requested that you have an echocardiogram. Echocardiography is a painless test that uses sound waves to create images of your heart. It provides your doctor with information about the size and shape of your heart and how well your heart's chambers and valves are working. This procedure takes approximately one hour. There are no restrictions for this procedure.   Your physician recommends that you return for lab work WHEN FASTING

## 2014-03-27 NOTE — Assessment & Plan Note (Signed)
Continue statin. Check lipids and liver. 

## 2014-03-27 NOTE — Assessment & Plan Note (Signed)
Continue present medications. Check potassium and renal function. 

## 2014-03-27 NOTE — Progress Notes (Signed)
HPI: FU CAD; history of a prior MI and CABG in 1996, diabetes mellitus, hypertension, and obesity, and DVT. Cath 12/11/12: he underwent drug-eluting stent of the SVG to the OM and had a patent LIMA to the LAD, and patent RIMA to the PDA. 2-D echo showed ejection fraction to be 45%. Since she was last seen 10/14, the patient has dyspnea with more extreme activities but not with routine activities. It is relieved with rest. It is not associated with chest pain. There is no orthopnea, PND. There is no syncope or palpitations. There is no exertional chest pain. Occasional mild pedal edema.   Current Outpatient Prescriptions  Medication Sig Dispense Refill  . amLODipine (NORVASC) 5 MG tablet Take 5 mg by mouth daily.        Marland Kitchen. aspirin 81 MG tablet Take 1 tablet (81 mg total) by mouth daily.      Marland Kitchen. atorvastatin (LIPITOR) 40 MG tablet Take 1 tablet (40 mg total) by mouth daily.  90 tablet  0  . glipiZIDE (GLUCOTROL XL) 5 MG 24 hr tablet Take 5 mg by mouth daily.        . Multiple Vitamin (MULTIVITAMIN) tablet Take 1 tablet by mouth daily.        . nitroGLYCERIN (NITROSTAT) 0.4 MG SL tablet Place 1 tablet (0.4 mg total) under the tongue every 5 (five) minutes as needed for chest pain (up to 3 doses).      . prasugrel (EFFIENT) 10 MG TABS Take 1 tablet (10 mg total) by mouth daily.  90 tablet  3  . sertraline (ZOLOFT) 100 MG tablet Take 100 mg by mouth daily.        Marland Kitchen. zonisamide (ZONEGRAN) 100 MG capsule TAKE 2 CAPSULES BY MOUTH AT BEDTIME  60 capsule  1   No current facility-administered medications for this visit.     Past Medical History  Diagnosis Date  . Hypertension     INTERMITTENT NONCOMPLIANCE  . Obesity   . OA (osteoarthritis)   . DVT (deep venous thrombosis)   . MI, old 321994    POSTERIOR M.I.  . RBBB (right bundle branch block)   . Hyperlipidemia   . Anxiety   . Depression   . Renal insufficiency, mild   . Decreased hearing   . Coronary artery disease     a. Remote MI in  1994 with CABG in 1996. b. s/p DES of SVG-OM 12/11/12.  . Diabetes mellitus     type 2   . LV dysfunction     EF 45% by echo 12/11/12    Past Surgical History  Procedure Laterality Date  . Cardiac catheterization  01/25/1995    NORMAL  . Coronary artery bypass graft  01/25/1995  . Knee surgery    . Cardiovascular stress test  06/16/2009    EF 43%, INFERIOR LATERAL HYPOKINESIA CONSISTENT WITH PREVIOUS INFARCTION, UNCHANGED FROM 2008. NO EVIDENCE OF ISCHEMIA  . Rotator cuff repair      multiple  . Elbow surgery      History   Social History  . Marital Status: Married    Spouse Name: N/A    Number of Children: N/A  . Years of Education: N/A   Occupational History  . Not on file.   Social History Main Topics  . Smoking status: Never Smoker   . Smokeless tobacco: Never Used  . Alcohol Use: No  . Drug Use: No  . Sexual Activity: Not Currently  Other Topics Concern  . Not on file   Social History Narrative  . No narrative on file    ROS: Some tingling in feet bilaterally butno fevers or chills, productive cough, hemoptysis, dysphasia, odynophagia, melena, hematochezia, dysuria, hematuria, rash, seizure activity, orthopnea, PND, pedal edema, claudication. Remaining systems are negative.  Physical Exam: Well-developed well-nourished in no acute distress.  Skin is warm and dry.  HEENT is normal.  Neck is supple.  Chest is clear to auscultation with normal expansion.  Cardiovascular exam is regular rate and rhythm.  Abdominal exam nontender or distended. No masses palpated. Extremities show no edema. neuro grossly intact  ECG Sinus rhythm at a rate of 76. Right bundle branch block. Inferior infarct.

## 2014-03-27 NOTE — Assessment & Plan Note (Signed)
Continue aspirin and statin. Discontinue effient. Electrocardiogram with question new inferior infarct. Check echocardiogram for wall motion and LV function.

## 2014-03-28 ENCOUNTER — Telehealth: Payer: Self-pay | Admitting: *Deleted

## 2014-03-28 LAB — HEPATIC FUNCTION PANEL
ALT: 52 U/L (ref 0–53)
AST: 42 U/L — ABNORMAL HIGH (ref 0–37)
Albumin: 3.9 g/dL (ref 3.5–5.2)
Alkaline Phosphatase: 68 U/L (ref 39–117)
BILIRUBIN INDIRECT: 1 mg/dL (ref 0.2–1.2)
Bilirubin, Direct: 0.2 mg/dL (ref 0.0–0.3)
Total Bilirubin: 1.2 mg/dL (ref 0.2–1.2)
Total Protein: 7.2 g/dL (ref 6.0–8.3)

## 2014-03-28 LAB — BASIC METABOLIC PANEL WITH GFR
BUN: 22 mg/dL (ref 6–23)
CO2: 25 mEq/L (ref 19–32)
CREATININE: 1.73 mg/dL — AB (ref 0.50–1.35)
Calcium: 9.3 mg/dL (ref 8.4–10.5)
Chloride: 101 mEq/L (ref 96–112)
GFR, EST AFRICAN AMERICAN: 44 mL/min — AB
GFR, Est Non African American: 38 mL/min — ABNORMAL LOW
Glucose, Bld: 293 mg/dL — ABNORMAL HIGH (ref 70–99)
Potassium: 4.6 mEq/L (ref 3.5–5.3)
SODIUM: 135 meq/L (ref 135–145)

## 2014-03-28 LAB — LIPID PANEL
CHOL/HDL RATIO: 4.6 ratio
Cholesterol: 130 mg/dL (ref 0–200)
HDL: 28 mg/dL — ABNORMAL LOW (ref >39)
LDL Cholesterol: 22 mg/dL (ref 0–99)
Triglycerides: 400 mg/dL — ABNORMAL HIGH (ref <150)
VLDL: 80 mg/dL — ABNORMAL HIGH (ref 0–40)

## 2014-03-28 NOTE — Telephone Encounter (Signed)
Pt here to discuss medication. Yesterday when he was seen he did not have his meds with him. He is currently taking metoprolol succ er 25 mg once daily, he questioned if her needs to cont. Will forward for dr Jens Somcrenshaw review

## 2014-03-28 NOTE — Telephone Encounter (Signed)
Continue present meds Tristan Baker  

## 2014-03-31 ENCOUNTER — Encounter: Payer: Self-pay | Admitting: *Deleted

## 2014-03-31 NOTE — Telephone Encounter (Signed)
Patient walked in on Friday 7/17 and spoke with Stanton KidneyDebra about discharge papers from [facility]

## 2014-03-31 NOTE — Telephone Encounter (Signed)
Left message for pt of dr crenshaw's recommendations  

## 2014-04-03 ENCOUNTER — Ambulatory Visit (HOSPITAL_COMMUNITY): Payer: Medicare Other

## 2014-04-05 ENCOUNTER — Other Ambulatory Visit: Payer: Self-pay | Admitting: Diagnostic Neuroimaging

## 2014-05-08 ENCOUNTER — Other Ambulatory Visit: Payer: Self-pay

## 2014-05-08 MED ORDER — ATORVASTATIN CALCIUM 40 MG PO TABS: 40.0000 mg | ORAL_TABLET | Freq: Every day | ORAL | Status: DC

## 2014-05-11 ENCOUNTER — Other Ambulatory Visit: Payer: Self-pay | Admitting: Diagnostic Neuroimaging

## 2014-05-13 ENCOUNTER — Other Ambulatory Visit: Payer: Self-pay

## 2014-05-15 ENCOUNTER — Other Ambulatory Visit: Payer: Self-pay | Admitting: Diagnostic Neuroimaging

## 2014-05-16 ENCOUNTER — Other Ambulatory Visit: Payer: Self-pay

## 2014-05-16 MED ORDER — ZONISAMIDE 100 MG PO CAPS: ORAL_CAPSULE | ORAL | Status: DC

## 2014-05-16 MED ORDER — METOPROLOL SUCCINATE ER 25 MG PO TB24: 25.0000 mg | ORAL_TABLET | Freq: Every day | ORAL | Status: DC

## 2014-06-05 ENCOUNTER — Other Ambulatory Visit: Payer: Self-pay | Admitting: *Deleted

## 2014-07-02 ENCOUNTER — Ambulatory Visit (INDEPENDENT_AMBULATORY_CARE_PROVIDER_SITE_OTHER): Payer: Medicare Other | Admitting: Diagnostic Neuroimaging

## 2014-07-02 ENCOUNTER — Encounter: Payer: Self-pay | Admitting: Diagnostic Neuroimaging

## 2014-07-02 ENCOUNTER — Encounter (INDEPENDENT_AMBULATORY_CARE_PROVIDER_SITE_OTHER): Payer: Self-pay

## 2014-07-02 VITALS — BP 134/71 | HR 71 | Ht 67.5 in | Wt 197.0 lb

## 2014-07-02 DIAGNOSIS — G40909 Epilepsy, unspecified, not intractable, without status epilepticus: Secondary | ICD-10-CM

## 2014-07-02 DIAGNOSIS — I25810 Atherosclerosis of coronary artery bypass graft(s) without angina pectoris: Secondary | ICD-10-CM

## 2014-07-02 NOTE — Patient Instructions (Signed)
Stay off zonisamide.

## 2014-07-02 NOTE — Progress Notes (Signed)
[facility]  PATIENT: Tristan SkainsFrank Baker DOB: 04/19/1941  REFERRING CLINICIAN:  HISTORY FROM: patient  REASON FOR VISIT: follow up   HISTORICAL  CHIEF COMPLAINT:  Chief Complaint  Patient presents with  . Follow-up    sz    HISTORY OF PRESENT ILLNESS:   UPDATE 07/02/14: Since last visit, doing well. No seizures. Ran out of zonisamide twice in last few months. Out of meds x last 21 days. No problems. Review of prior seizures (1963 with pneumonia; 1980 with viral cold infx; 1994 with stress; 1998 with shoulder surg).   UPDATE 10/18/12: Since last visit, continuing on ZNG 200mg  qhs. No sz. No issues. Feeling somewhat tired. Some cramps in last 6 months. [facility]  [facility] (2011, 2012), and did well. Went back to 200mg  qhs b/c he didn't want to go against medical advice. He is afraid to go off ZNG all together however.  UPDATE 04/18/11: Doing well.  No seizures since 1998.  Tolerating zonisamide.  No side effects.    PRIOR HPI (07/30/09, Dr. Charleston RopesM Reynolds): Has had 4 episodes over the last 4-5 decades in which he has lost consciousness and suffered a convulsion, 3 in the setting of viral illnesses and the fourth following shoulder surgery in 1998. He had a normal EEG at that time and was placed on Carbatrol. He has had no further episodes of altered consciousness. In 2006, he was switched to zonisamide for cost considerations. I last saw him in December 2008. He returns today denying any interval episodes of altered consciousness. He continues zonisamide without problems. His major complaint today is dizziness, which occurs reliably when he sits up from a supine position, or sometimes when he turns over in bed. He does feel some spinning, but cannot identify the direction. This lasts a few seconds and resolves, and is not as severe if he moves more slowly and deliberately. He has been using nasal spray and ear drops, and thinks they have been of some  help.  REVIEW OF SYSTEMS: Full 14 system review of systems performed and notable only for only as per HPI.  ALLERGIES: Allergies  Allergen Reactions  . Choline Fenofibrate     Trilpax    HOME MEDICATIONS: Outpatient Prescriptions Prior to Visit  Medication Sig Dispense Refill  . amLODipine (NORVASC) 5 MG tablet Take 5 mg by mouth daily.        Marland Kitchen. aspirin 81 MG tablet Take 1 tablet (81 mg total) by mouth daily.      Marland Kitchen. atorvastatin (LIPITOR) 40 MG tablet Take 1 tablet (40 mg total) by mouth daily.  90 tablet  3  . gabapentin (NEURONTIN) 300 MG capsule Take 1 capsule (300 mg total) by mouth at bedtime.      Marland Kitchen. glipiZIDE (GLUCOTROL XL) 5 MG 24 hr tablet Take 10 mg by mouth daily.       . metoprolol succinate (TOPROL XL) 25 MG 24 hr tablet Take 1 tablet (25 mg total) by mouth daily.  30 tablet  2  . Multiple Vitamin (MULTIVITAMIN) tablet Take 1 tablet by mouth daily.        . nitroGLYCERIN (NITROSTAT) 0.4 MG SL tablet Place 1 tablet (0.4 mg total) under the tongue every 5 (five) minutes as needed for chest pain (up to 3 doses).      . sertraline (ZOLOFT) 100 MG tablet Take 100 mg by mouth daily.        Marland Kitchen. zonisamide (ZONEGRAN) 100 MG capsule TAKE 2  CAPSULES BY MOUTH AT BEDTIME  30 capsule  0   No facility-administered medications prior to visit.    PAST MEDICAL HISTORY: Past Medical History  Diagnosis Date  . Hypertension     INTERMITTENT NONCOMPLIANCE  . Obesity   . OA (osteoarthritis)   . DVT (deep venous thrombosis)   . 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  . RBBB (right bundle branch block)   . Hyperlipidemia   . Anxiety   . Depression   . Renal insufficiency, mild   . Decreased hearing   . Coronary artery disease     a. 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 b. s/p DES of SVG-OM 12/11/12.  . Diabetes mellitus     type 2   . LV dysfunction     EF 45% by echo 12/11/12    PAST SURGICAL HISTORY: Past Surgical History  Procedure Laterality Date  . Cardiac catheterization  01/25/1995     NORMAL  . Coronary artery bypass graft  01/25/1995  . Knee surgery    . Cardiovascular stress test  06/16/2009    EF 43%, INFERIOR LATERAL HYPOKINESIA CONSISTENT WITH PREVIOUS INFARCTION, UNCHANGED FROM 2008. NO EVIDENCE OF ISCHEMIA  . Rotator cuff repair      multiple  . Elbow surgery      FAMILY HISTORY: Family History  Problem Relation Age of Onset  . Stroke Mother   . Heart disease Father     SOCIAL HISTORY:  History   Social History  . Marital Status: Married    Spouse Name: [name]    Number of Children: 4  . Years of Education: N/A   Occupational History  .       [employer]   Social History Main Topics  . Smoking status: Never Smoker   . Smokeless tobacco: Never Used  . Alcohol Use: No  . Drug Use: No  . Sexual Activity: Not Currently   Other Topics Concern  . Not on file   Social History Narrative   Patient lives at home with his spouse.   Caffeine Use: 2 cups daily; ice tea or soda daily     PHYSICAL EXAM  Filed Vitals:   07/02/14 0840  BP: 134/71  Pulse: 71  Height: 5' 7.5" (1.715 m)  Weight: 197 lb (89.359 kg)    Not recorded   No exam data present   Body mass index is 30.38 kg/(m^2).  GENERAL EXAM: Patient is in no distress; well developed, nourished and groomed; neck is supple  CARDIOVASCULAR: Regular rate and rhythm, no murmurs, no carotid bruits  NEUROLOGIC: MENTAL STATUS: awake, alert, language fluent, comprehension intact, naming intact, fund of knowledge appropriate CRANIAL NERVE: no papilledema on fundoscopic exam, pupils equal and reactive to light, visual fields full to confrontation, extraocular muscles intact, no nystagmus, facial sensation and strength symmetric, hearing intact, palate elevates symmetrically, uvula midline, shoulder shrug symmetric, tongue midline. MOTOR: normal bulk and tone, full strength in the BUE, BLE SENSORY: normal and symmetric to light touch, temperature, vibration COORDINATION:  finger-nose-finger, fine finger movements normal REFLEXES: deep tendon reflexes present and symmetric; TRACE AT ANKLES GAIT/STATION: narrow based gait; able to walk tandem; romberg is negative    DIAGNOSTIC DATA (LABS, IMAGING, TESTING) - I reviewed patient records, labs, notes, testing and imaging myself where available.  Lab Results  Component Value Date   WBC 7.7 12/12/2012   HGB 13.2 12/12/2012   HCT 38.0* 12/12/2012   MCV 93.1 12/12/2012  PLT 171 12/12/2012      Component Value Date/Time   NA 135 03/28/2014 1034   K 4.6 03/28/2014 1034   CL 101 03/28/2014 1034   CO2 25 03/28/2014 1034   GLUCOSE 293* 03/28/2014 1034   BUN 22 03/28/2014 1034   CREATININE 1.73* 03/28/2014 1034   CREATININE 1.6* 02/01/2013 1441   CALCIUM 9.3 03/28/2014 1034   PROT 7.2 03/28/2014 1034   ALBUMIN 3.9 03/28/2014 1034   AST 42* 03/28/2014 1034   ALT 52 03/28/2014 1034   ALKPHOS 68 03/28/2014 1034   BILITOT 1.2 03/28/2014 1034   GFRNONAA 38* 03/28/2014 1034   GFRNONAA 36* 12/12/2012 0435   GFRAA 44* 03/28/2014 1034   GFRAA 42* 12/12/2012 0435   Lab Results  Component Value Date   CHOL 130 03/28/2014   HDL 28* 03/28/2014   LDLCALC 22 03/28/2014   LDLDIRECT 58.5 12/04/2012   TRIG 400* 03/28/2014   CHOLHDL 4.6 03/28/2014   Lab Results  Component Value Date   HGBA1C 8.2* 12/04/2012   No results found for this basename: VITAMINB12   No results found for this basename: TSH    I reviewed images myself and agree with interpretation. -VRP  08/25/97 MRI brain - normal  08/25/97 EEG - normal   ASSESSMENT AND PLAN  73 y.o. year old male here with seizure disorder since 1963. Last seizure in 1998. Was doing well on low dose zonisamide. Therefore, will try stopping anti-seizure meds and see how he does.  PLAN: 1. Stay off zonisamide 2. Caution with driving for next 2-3 months  Return in about 6 months (around 01/01/2015).    Suanne Marker, MD 07/02/2014, 9:26 AM Certified in Neurology, Neurophysiology and  Neuroimaging  [facility] [address], [address] [place], [place] [address] [phone]

## 2014-08-21 ENCOUNTER — Encounter (HOSPITAL_COMMUNITY): Payer: Self-pay | Admitting: Cardiology

## 2014-08-28 ENCOUNTER — Other Ambulatory Visit: Payer: Self-pay | Admitting: *Deleted

## 2014-08-28 MED ORDER — METOPROLOL SUCCINATE ER 25 MG PO TB24: 25.0000 mg | ORAL_TABLET | Freq: Every day | ORAL | Status: DC

## 2014-12-02 ENCOUNTER — Telehealth: Payer: Self-pay | Admitting: *Deleted

## 2014-12-02 NOTE — Telephone Encounter (Signed)
Called and left message for the pt, asked him to call back and reschedule his follow-up appt. If he does call back, please try to get him scheduled for March 28, 29, or 30th if available. Thanks

## 2014-12-03 ENCOUNTER — Telehealth: Payer: Self-pay | Admitting: *Deleted

## 2014-12-03 NOTE — Telephone Encounter (Signed)
Spoke with the pt on the phone and was able to get his follow- up appt changed.

## 2014-12-22 ENCOUNTER — Ambulatory Visit (INDEPENDENT_AMBULATORY_CARE_PROVIDER_SITE_OTHER): Payer: Medicare Other | Admitting: Diagnostic Neuroimaging

## 2014-12-22 ENCOUNTER — Encounter: Payer: Self-pay | Admitting: Diagnostic Neuroimaging

## 2014-12-22 VITALS — BP 138/64 | HR 65 | Ht 67.0 in | Wt 196.4 lb

## 2014-12-22 DIAGNOSIS — G40909 Epilepsy, unspecified, not intractable, without status epilepticus: Secondary | ICD-10-CM | POA: Diagnosis not present

## 2014-12-22 NOTE — Progress Notes (Signed)
[facility]  PATIENT: Tristan Baker DOB: 10/23/1940  REFERRING CLINICIAN:  HISTORY FROM: patient  REASON FOR VISIT: follow up   HISTORICAL  CHIEF COMPLAINT:  Chief Complaint  Patient presents with  . Follow-up    seizure disorder     HISTORY OF PRESENT ILLNESS:   UPDATE 12/22/14: Since last visit, has stayed off zonisamide and has done well. No seizures. No new issues.   UPDATE 07/02/14: Since last visit, doing well. No seizures. Ran out of zonisamide twice in last few months. Out of meds x last 21 days. No problems. Review of prior seizures (1963 with pneumonia; 1980 with viral cold infx; 1994 with stress; 1998 with shoulder surg).   UPDATE 10/18/12: Since last visit, continuing on ZNG 200mg  qhs. No sz. No issues. Feeling somewhat tired. Some cramps in last 6 months. [facility] ([facility], 2012), and did well. Went back to 200mg  qhs b/c he didn't want to go against medical advice. He is afraid to go off ZNG all together however.  UPDATE 04/18/11: Doing well.  No seizures since 1998.  Tolerating zonisamide.  No side effects.    PRIOR HPI (07/30/09, Dr. Charleston RopesM Reynolds): Has had 4 episodes over the last 4-5 decades in which he has lost consciousness and suffered a convulsion, 3 in the setting of viral illnesses and the fourth following shoulder surgery in 1998. He had a normal EEG at that time and was placed on Carbatrol. He has had no further episodes of altered consciousness. In 2006, he was switched to zonisamide for cost considerations. I last saw him in December 2008. He returns today denying any interval episodes of altered consciousness. He continues zonisamide without problems. His major complaint today is dizziness, which occurs reliably when he sits up from a supine position, or sometimes when he turns over in bed. He does feel some spinning, but cannot identify the direction. This lasts a few seconds and resolves, and is not as severe if he  moves more slowly and deliberately. He has been using nasal spray and ear drops, and thinks they have been of some help.  REVIEW OF SYSTEMS: Full 14 system review of systems performed and notable only for only as per HPI.  ALLERGIES: Allergies  Allergen Reactions  . Choline Fenofibrate     Trilpax    HOME MEDICATIONS: Outpatient Prescriptions Prior to Visit  Medication Sig Dispense Refill  . amLODipine (NORVASC) 5 MG tablet Take 5 mg by mouth daily.      Marland Kitchen. aspirin 81 MG tablet Take 1 tablet (81 mg total) by mouth daily.    Marland Kitchen. atorvastatin (LIPITOR) 40 MG tablet Take 1 tablet (40 mg total) by mouth daily. 90 tablet 3  . gabapentin (NEURONTIN) 300 MG capsule Take 1 capsule (300 mg total) by mouth at bedtime.    . metoprolol succinate (TOPROL XL) 25 MG 24 hr tablet Take 1 tablet (25 mg total) by mouth daily. 30 tablet 6  . Multiple Vitamin (MULTIVITAMIN) tablet Take 1 tablet by mouth daily.      . nitroGLYCERIN (NITROSTAT) 0.4 MG SL tablet Place 1 tablet (0.4 mg total) under the tongue every 5 (five) minutes as needed for chest pain (up to 3 doses).    . sertraline (ZOLOFT) 100 MG tablet Take 100 mg by mouth daily.      Marland Kitchen. zonisamide (ZONEGRAN) 100 MG capsule TAKE 2 CAPSULES BY MOUTH AT BEDTIME 30 capsule 0  . glipiZIDE (GLUCOTROL XL) 5 MG  24 hr tablet Take 10 mg by mouth daily.      No facility-administered medications prior to visit.    PAST MEDICAL HISTORY: Past Medical History  Diagnosis Date  . Hypertension     INTERMITTENT NONCOMPLIANCE  . Obesity   . OA (osteoarthritis)   . DVT (deep venous thrombosis)   . MI, old 45    POSTERIOR M.I.  . RBBB (right bundle branch block)   . Hyperlipidemia   . Anxiety   . Depression   . Renal insufficiency, mild   . Decreased hearing   . Coronary artery disease     a. 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 b. s/p DES of SVG-OM 12/11/12.  . Diabetes mellitus     type 2   . LV dysfunction     EF 45% by echo 12/11/12    PAST SURGICAL  HISTORY: Past Surgical History  Procedure Laterality Date  . Cardiac catheterization  01/25/1995    NORMAL  . Coronary artery bypass graft  01/25/1995  . Knee surgery    . Cardiovascular stress test  06/16/2009    EF 43%, INFERIOR LATERAL HYPOKINESIA CONSISTENT WITH PREVIOUS INFARCTION, UNCHANGED FROM 2008. NO EVIDENCE OF ISCHEMIA  . Rotator cuff repair      multiple  . Elbow surgery    . Left heart catheterization with coronary/graft angiogram N/A 12/11/2012    Procedure: LEFT HEART CATHETERIZATION WITH Isabel Caprice;  Surgeon: Peter M Swaziland, MD;  Location: [facility] CATH LAB;  Service: Cardiovascular;  Laterality: N/A;    FAMILY HISTORY: Family History  Problem Relation Age of Onset  . Stroke Mother   . Heart disease Father     SOCIAL HISTORY:  History   Social History  . Marital Status: Married    Spouse Name: Graciella Belton  . Number of Children: 4  . Years of Education: college   Occupational History  .       [employer]   Social History Main Topics  . Smoking status: Never Smoker   . Smokeless tobacco: Never Used  . Alcohol Use: No  . Drug Use: No  . Sexual Activity: Not Currently   Other Topics Concern  . Not on file   Social History Narrative   Patient lives at home with his spouse.   Caffeine Use: 2 cups daily; ice tea or soda daily     PHYSICAL EXAM  Filed Vitals:   12/22/14 0838  BP: 138/64  Pulse: 65  Height:  (1.702 m)  Weight: 196 lb 6.4 oz (89.086 kg)    Not recorded     No exam data present   Body mass index is 30.75 kg/(m^2).  GENERAL EXAM: Patient is in no distress; well developed, nourished and groomed; neck is supple  CARDIOVASCULAR: Regular rate and rhythm, no murmurs, no carotid bruits  NEUROLOGIC: MENTAL STATUS: awake, alert, language fluent, comprehension intact, naming intact, fund of knowledge appropriate CRANIAL NERVE: pupils equal and reactive to light, visual fields full to confrontation, extraocular muscles  intact, no nystagmus, facial sensation and strength symmetric, hearing DECR; palate elevates symmetrically, uvula midline, shoulder shrug symmetric, tongue midline. MOTOR: normal bulk and tone, full strength in the BUE, BLE SENSORY: normal and symmetric to light touch  COORDINATION: finger-nose-finger, fine finger movements normal REFLEXES: deep tendon reflexes TRACE and symmetric GAIT/STATION: narrow based gait; able to walk tandem; romberg is negative    DIAGNOSTIC DATA (LABS, IMAGING, TESTING) - I reviewed patient records, labs, notes, testing and  imaging myself where available.  Lab Results  Component Value Date   WBC 7.7 12/12/2012   HGB 13.2 12/12/2012   HCT 38.0* 12/12/2012   MCV 93.1 12/12/2012   PLT 171 12/12/2012      Component Value Date/Time   NA 135 03/28/2014 1034   K 4.6 03/28/2014 1034   CL 101 03/28/2014 1034   CO2 25 03/28/2014 1034   GLUCOSE 293* 03/28/2014 1034   BUN 22 03/28/2014 1034   CREATININE 1.73* 03/28/2014 1034   CREATININE 1.6* 02/01/2013 1441   CALCIUM 9.3 03/28/2014 1034   PROT 7.2 03/28/2014 1034   ALBUMIN 3.9 03/28/2014 1034   AST 42* 03/28/2014 1034   ALT 52 03/28/2014 1034   ALKPHOS 68 03/28/2014 1034   BILITOT 1.2 03/28/2014 1034   GFRNONAA 38* 03/28/2014 1034   GFRNONAA 36* 12/12/2012 0435   GFRAA 44* 03/28/2014 1034   GFRAA 42* 12/12/2012 0435   Lab Results  Component Value Date   CHOL 130 03/28/2014   HDL 28* 03/28/2014   LDLCALC 22 03/28/2014   LDLDIRECT 58.5 12/04/2012   TRIG 400* 03/28/2014   CHOLHDL 4.6 03/28/2014   Lab Results  Component Value Date   HGBA1C 8.2* 12/04/2012   No results found for: VITAMINB12 No results found for: TSH  I reviewed images myself and agree with interpretation. -VRP  08/25/97 MRI brain - normal  08/25/97 EEG - normal   ASSESSMENT AND PLAN  74 y.o. year old male here with seizure disorder since 1963. Last seizure in 1998. Now off zonisamide since Oct 2015, and remains seizure  free.    PLAN: 1. Stay off zonisamide 2. Monitor symptoms  Return in about 1 year (around 12/22/2015).  I spent 10 minutes of face to face time with patient. Greater than 50% of time was spent in counseling and coordination of care with patient.   Suanne Marker, MD 12/22/2014, 8:55 AM Certified in Neurology, Neurophysiology and Neuroimaging  [facility] [address], [address] [place], [place] [address] [phone]

## 2015-01-05 ENCOUNTER — Ambulatory Visit: Payer: Medicare Other | Admitting: Diagnostic Neuroimaging

## 2015-01-15 ENCOUNTER — Other Ambulatory Visit: Payer: Self-pay

## 2015-01-15 NOTE — Patient Outreach (Signed)
[facility]) Care Management  01/15/2015  Corliss SkainsFrank Greaves 10/12/1940 [id number]   Telephone call to patient regarding primary MD referral.  Attempted call to home number and mobile number.  Unable to reach patient or leave voice message due to mailboxes being full.   PLAN:  RNCM will attempt 2nd telephone outreach to patient within 1 week.   George InaDavina Brandy Zuba RN,BSN,CCM [facility] Telephonic Care Coordinator [phone]

## 2015-01-15 NOTE — Patient Outreach (Signed)
[facility]) Care Management  01/15/2015  Tristan SkainsFrank Baker 03/12/1941 [id number]   Telephone call received from patient inquiring about reason for call.  RNCM identified as calling from [facility].  Patient states he is traveling at this time and will have to call back on tomorrow.   PLAN:  RNCM will await call from patient.  If no return call in 2 business days will outreach to patient.   George InaDavina Tahjae Clausing RN,BSN,CCM [facility] Telephonic Care Coordinator [phone]

## 2015-01-19 ENCOUNTER — Ambulatory Visit: Payer: Self-pay

## 2015-01-19 ENCOUNTER — Other Ambulatory Visit: Payer: Self-pay

## 2015-01-19 ENCOUNTER — Other Ambulatory Visit: Payer: Self-pay | Admitting: Pharmacist

## 2015-01-19 NOTE — Patient Outreach (Signed)
[facility]) Care Management  [facility]   01/19/2015  Tristan SkainsFrank Abbasi 06/03/1941 [id number]  Subjective: Tristan Baker is a 74 y.o. male [facility] with affording his insulin.  I spoke to the patient and he reported that he did not need assistance at this time. He reports that his physician switched the insulin to something that would only cost him $30 rather than $175. He is not interested in any other pharmacy assistance at this time.   Objective:   Current Medications: Current Outpatient Prescriptions  Medication Sig Dispense Refill  . amLODipine (NORVASC) 5 MG tablet Take 5 mg by mouth daily.      Marland Kitchen. aspirin 81 MG tablet Take 1 tablet (81 mg total) by mouth daily.    Marland Kitchen. atorvastatin (LIPITOR) 40 MG tablet Take 1 tablet (40 mg total) by mouth daily. 90 tablet 3  . gabapentin (NEURONTIN) 300 MG capsule Take 1 capsule (300 mg total) by mouth at bedtime.    Marland Kitchen. glipiZIDE (GLUCOTROL) 10 MG tablet Take 10 mg by mouth 2 (two) times daily.  6  . lisinopril (PRINIVIL,ZESTRIL) 10 MG tablet Take 10 mg by mouth daily.    . metoprolol succinate (TOPROL XL) 25 MG 24 hr tablet Take 1 tablet (25 mg total) by mouth daily. 30 tablet 6  . Multiple Vitamin (MULTIVITAMIN) tablet Take 1 tablet by mouth daily.      . nitroGLYCERIN (NITROSTAT) 0.4 MG SL tablet Place 1 tablet (0.4 mg total) under the tongue every 5 (five) minutes as needed for chest pain (up to 3 doses).    . sertraline (ZOLOFT) 100 MG tablet Take 100 mg by mouth daily.      . traMADol (ULTRAM) 50 MG tablet Take 50 mg by mouth every 8 (eight) hours as needed. for pain  1  . zonisamide (ZONEGRAN) 100 MG capsule TAKE 2 CAPSULES BY MOUTH AT BEDTIME 30 capsule 0   No current facility-administered medications for this visit.    Functional Status: No flowsheet data found.  Fall/Depression Screening: No flowsheet data found.  Assessment: 1. Medication assistance: patient denies  need for pharmacy assistance at this time.  Plan: 1. Medication assistance: will not open a pharmacy program as patient is not interested in [facility] assistance at this time. He has found a cheaper alternative through his physician's office. He reported that he would call me in the future if he ever needed anything (he has [facility] contact information).    Juanita CraverStacey Karl, PharmD, BCPS [facility] Pharm[facility] Resident [phone]

## 2015-01-19 NOTE — Patient Outreach (Signed)
[facility]) Care Management  01/19/2015  Tristan SkainsFrank Baker 07/01/1941 [id number]   Second telephone outreach to patient regarding primary MD referral.  HIPAA verified with patient.  Discussed and offered [facility] to patient.  Patient states his issue is with his insulin and the cost.  Patient reports his co-pay would be approximately $175.00 per month for his insulin but states this is not a good time to talk and he would have to call this RNCM back.  RNCM gave patient contact name and phone number.    PLAN: RNCM will await patients return call.  If no return call in 2 business days RNCM will attempt 3rd outreach to patient.   George InaDavina Ladaisha Portillo RN,BSN,CCM [facility] Telephonic Care Coordinator [phone]

## 2015-01-21 ENCOUNTER — Other Ambulatory Visit: Payer: Self-pay

## 2015-01-21 ENCOUNTER — Ambulatory Visit: Payer: Self-pay

## 2015-01-21 NOTE — Patient Outreach (Addendum)
[facility]) Care Management  01/21/2015  Tristan SkainsFrank Baker 11/21/1940 [id number]   SUBJECTIVE: Telephone call to patient due to primary MD referral.  HIPAA verified by patient.  Patient states he will be following up with his primary MD this week for to discuss alternative insulin medication that is more affordable.  Patient refuses referral to [facility] pharmac[facility] for medication assistance and refuses [facility] services for diabetes education.  RNCM gave patient [facility] contact phone number for future use. Patient agreed to receive [facility] outreach letter and pamphlet.    PLAN: RNCM will refer patient to  Nena PolioLisa Moore to close due to refusal of services. RNCM will send letter to primary MD to notify of patients refusal to participate in services. RNCM will send patient outreach letter and pamphlet as requested.   George InaDavina Tejah Brekke RN,BSN,CCM [facility] Telephonic Care Coordinator [phone]

## 2015-01-22 NOTE — Patient Outreach (Signed)
[facility]) Care Management  01/22/2015  Corliss SkainsFrank Sao 02/23/1941 [id number]   Received notification from George Inaavina Green, RN to close case as patient refused to patient with [facility] services.  Corrie MckusickLisa O. [facility] Care [facility] CM Assistant Phone: [phone] Fax: [phone]

## 2015-05-12 ENCOUNTER — Other Ambulatory Visit: Payer: Self-pay | Admitting: Orthopaedic Surgery

## 2015-05-12 DIAGNOSIS — M25511 Pain in right shoulder: Secondary | ICD-10-CM

## 2015-05-22 ENCOUNTER — Telehealth: Payer: Self-pay

## 2015-05-27 ENCOUNTER — Other Ambulatory Visit: Payer: Self-pay | Admitting: *Deleted

## 2015-05-27 MED ORDER — METOPROLOL SUCCINATE ER 25 MG PO TB24: 25.0000 mg | ORAL_TABLET | Freq: Every day | ORAL | Status: DC

## 2015-06-22 ENCOUNTER — Telehealth: Payer: Self-pay | Admitting: Cardiology

## 2015-06-22 NOTE — Telephone Encounter (Signed)
Received a call from [place] with [facility] wanting to know name of cardiac stent.Patient had a promus premier cardiac stent 12/11/12.

## 2015-07-02 ENCOUNTER — Other Ambulatory Visit: Payer: Self-pay | Admitting: Cardiology

## 2015-07-02 MED ORDER — ATORVASTATIN CALCIUM 40 MG PO TABS: 40.0000 mg | ORAL_TABLET | Freq: Every day | ORAL | Status: DC

## 2015-07-03 ENCOUNTER — Ambulatory Visit
Admission: RE | Admit: 2015-07-03 | Discharge: 2015-07-03 | Disposition: A | Discharge status: Home / Self Care | Payer: Medicare Other | Source: Ambulatory Visit | Attending: Orthopaedic Surgery | Admitting: Orthopaedic Surgery

## 2015-07-03 DIAGNOSIS — M25511 Pain in right shoulder: Secondary | ICD-10-CM

## 2015-07-13 ENCOUNTER — Other Ambulatory Visit: Payer: Self-pay | Admitting: Orthopaedic Surgery

## 2015-07-13 DIAGNOSIS — M542 Cervicalgia: Secondary | ICD-10-CM

## 2015-07-25 ENCOUNTER — Other Ambulatory Visit: Payer: Medicare Other

## 2015-07-26 ENCOUNTER — Ambulatory Visit
Admission: RE | Admit: 2015-07-26 | Discharge: 2015-07-26 | Disposition: A | Discharge status: Home / Self Care | Payer: Medicare Other | Source: Ambulatory Visit | Attending: Orthopaedic Surgery | Admitting: Orthopaedic Surgery

## 2015-07-26 ENCOUNTER — Inpatient Hospital Stay: Admission: RE | Admit: 2015-07-26 | Payer: Medicare Other | Source: Ambulatory Visit

## 2015-07-26 DIAGNOSIS — M542 Cervicalgia: Secondary | ICD-10-CM

## 2015-08-10 ENCOUNTER — Encounter: Payer: Self-pay | Admitting: Physical Therapy

## 2015-08-10 ENCOUNTER — Ambulatory Visit: Payer: Medicare Other | Attending: Orthopaedic Surgery | Admitting: Physical Therapy

## 2015-08-10 DIAGNOSIS — M25511 Pain in right shoulder: Secondary | ICD-10-CM | POA: Insufficient documentation

## 2015-08-10 DIAGNOSIS — M436 Torticollis: Secondary | ICD-10-CM | POA: Insufficient documentation

## 2015-08-10 DIAGNOSIS — R29898 Other symptoms and signs involving the musculoskeletal system: Secondary | ICD-10-CM | POA: Insufficient documentation

## 2015-08-10 DIAGNOSIS — M542 Cervicalgia: Secondary | ICD-10-CM | POA: Diagnosis present

## 2015-08-10 NOTE — Therapy (Signed)
[facility] [address], [address] [place], [place], [address] Phone: [phone]   Fax:  [phone]  Physical Therapy Evaluation  Patient Details  Name: Tristan Baker MRN: [id number] Date of Birth: [birth year] Referring Provider: Dr. Norlene Campbell  Encounter Date: 08/10/2015      PT End of Session - 08/10/15 1205    Visit Number 1   Number of Visits 10  medicare   Date for PT Re-Evaluation 10/05/15   PT Start Time 1200   PT Stop Time 1230   PT Time Calculation (min) 30 min   Activity Tolerance Patient tolerated treatment well   Behavior During Therapy [facility] for tasks assessed/performed      Past Medical History  Diagnosis Date  . Hypertension     INTERMITTENT NONCOMPLIANCE  . Obesity   . OA (osteoarthritis)   . DVT (deep venous thrombosis) (HCC)   . 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  . RBBB (right bundle branch block)   . Hyperlipidemia   . Anxiety   . Depression   . Renal insufficiency, mild   . Decreased hearing   . Coronary artery disease     a. 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 b. s/p DES of SVG-OM 12/11/12.  . Diabetes mellitus     type 2   . LV dysfunction     EF 45% by echo 12/11/12    Past Surgical History  Procedure Laterality Date  . Cardiac catheterization  01/25/1995    NORMAL  . Coronary artery bypass graft  01/25/1995  . Knee surgery    . Cardiovascular stress test  06/16/2009    EF 43%, INFERIOR LATERAL HYPOKINESIA CONSISTENT WITH PREVIOUS INFARCTION, UNCHANGED FROM 2008. NO EVIDENCE OF ISCHEMIA  . Rotator cuff repair      multiple  . Elbow surgery    . Left heart catheterization with coronary/graft angiogram N/A 12/11/2012    Procedure: LEFT HEART CATHETERIZATION WITH Isabel Caprice;  Surgeon: Peter M Swaziland, MD;  Location: [facility] CATH LAB;  Service: Cardiovascular;  Laterality: N/A;    There were no vitals filed for this visit.  Visit Diagnosis:  Shoulder weakness - Plan: PT plan  of care cert/re-cert  Stiffness of cervical spine - Plan: PT plan of care cert/re-cert  Cervical pain - Plan: PT plan of care cert/re-cert  Pain in joint of right shoulder - Plan: PT plan of care cert/re-cert      Subjective Assessment - 08/10/15 1208    Subjective Patient reports right shoulder pain started 6 months and not able to lift arm overhead.  Cervical pain into left shoulder for 6 years. Has to crawl up wall to get deodarant, uses left arm to assist right arm to brush teeth   Patient Stated Goals be able to lift right arm, reduce pain   Currently in Pain? Yes   Pain Score 4    Pain Location Neck   Pain Orientation Right;Left   Pain Descriptors / Indicators Constant   Pain Type Chronic pain   Pain Radiating Towards radiates into  upper trap bil. with left worse than right   Pain Onset Other (comment)   Pain Frequency Constant   Aggravating Factors  pressure on neck, driving   Pain Relieving Factors rest   Multiple Pain Sites Yes   Pain Score 6   Pain Location Shoulder   Pain Orientation Right;Anterior   Pain Type Acute pain   Pain Onset More than a month  ago   Pain Frequency Intermittent   Aggravating Factors  raise his right arm   Pain Relieving Factors arm at side   Effect of Pain on Daily Activities raching overhead            [facility] PT Assessment - 08/10/15 0001    Assessment   Medical Diagnosis Neck and right shoulder pain   Referring Provider Dr. Norlene Campbell   Prior Therapy None   Precautions   Precautions None   Balance Screen   Has the patient fallen in the past 6 months No   Has the patient had a decrease in activity level because of a fear of falling?  No   Is the patient reluctant to leave their home because of a fear of falling?  No   Prior Function   Level of Independence Independent   Vocation Full time employment   Vocation Requirements driving, sitting at computer   Cognition   Overall Cognitive Status Within Functional Limits for  tasks assessed   Observation/Other Assessments   Focus on Therapeutic Outcomes (FOTO)  46% limitation CK  goal is 43% limitation CK   AROM   Right Shoulder Flexion 80 Degrees   Right Shoulder ABduction 60 Degrees  elevates shoulder   Cervical Flexion decreaased by 25% with pain   Cervical Extension decreased by 75% with pain  in left upper trap   Cervical - Right Side Bend decreased by 25%   Cervical - Left Side Bend decreased by 25%   Cervical - Right Rotation decreased by 25%   Cervical - Left Rotation decreased by 25%   PROM   Right Shoulder Flexion 165 Degrees   Right Shoulder ABduction 145 Degrees   Right Shoulder Internal Rotation 45 Degrees   Right Shoulder External Rotation 85 Degrees   Strength   Right Shoulder Flexion 3-/5   Right Shoulder Extension 4-/5   Right Shoulder ABduction 3-/5   Right Shoulder Internal Rotation 5/5   Right Shoulder External Rotation 4/5   Palpation   Palpation comment palpable  tenderness located in bilateral cervical paraspinals, upper trap, and anterior right shoulder                             PT Short Term Goals - 08/10/15 1308    PT SHORT TERM GOAL #1   Title independent with HEP for cane exercises and cervical ROM   Time 4   Period Weeks   Status New   PT SHORT TERM GOAL #2   Title cervcial pain with work activities decreased >/= 25%   Time 4   Period Weeks   Status New   PT SHORT TERM GOAL #3   Title right shoulder flexion AROM >/= 100 degrees   Time 4   Period Weeks   Status New   PT SHORT TERM GOAL #4   Title right shoulder AROM for abduction >/= 90 degrees   Time 4   Period Weeks   Status New           PT Long Term Goals - 08/10/15 1310    PT LONG TERM GOAL #1   Title independent with HEP and understand how to progress himself   Time 8   Period Weeks   Status New   PT LONG TERM GOAL #2   Title cervical ROM improved by 25% so he is able to move his cervical with 50% less pain   Time  8    Period Weeks   Status New   PT LONG TERM GOAL #3   Title right shoulder strength >/= 4/5 so he is able to reach overhead to put deodarant on.   Time 8   Period Weeks   Status New   PT LONG TERM GOAL #4   Title eat with right arm without assistance due to right shoulder strength >/= 4/5   Time 8   Period Weeks   Status New   PT LONG TERM GOAL #5   Title FOTO score </= 43% limitation   Time 8   Period Weeks   Status New               Plan - 08/10/15 1255    Clinical Impression Statement Patient is a 74 year old male with diagnosis of neck and right shoulder pain. FOTO score is 46% limitation. Right shoulder pain started suddenly 6 months ago and cervical started 6 years ago.  Patient reports cervical pain is constant at level 6/10 radiating to bil. upper trap. Right hsoulder pain is 6/10 in anteriorl shoulder with palpation.  Patien tis having difficulty raising right arm overhead, eating with right arm , and puting deodarant. Patient has tenderness located in right shoulder and bilateral cervical paraspinals.  Cervical ROM deficits: felxion decreased by 25%, extension decreased by 75%, and bil. rotation and sidebending decreased by 25%.  Right shoulder AROM/PROM in degrees: flexion 80/165, abduction60/145, internal rotation60/45, and external rotation85/90.  Right hsoulder strength: flexion3-/5, abduction 3-/5, interanl rotatoin 5/5, and external rotation 1/5. and extesnion 4-/5.  Patient would benefit fro physical therapy to imporve right shoulder strnegth and ROM and reduce cervical pain.    Pt will benefit from skilled therapeutic intervention in order to improve on the following deficits Pain;Decreased strength;Decreased mobility;Impaired flexibility;Impaired UE functional use;Increased muscle spasms;Decreased endurance;Decreased range of motion;Increased fascial restricitons   Rehab Potential Good   Clinical Impairments Affecting Rehab Potential None   PT Frequency 2x / week    PT Duration 8 weeks   PT Treatment/Interventions Therapeutic activities;Therapeutic exercise;Moist Heat;Iontophoresis 4mg /ml Dexamethasone;Ultrasound;Manual techniques;Patient/family education;Neuromuscular re-education;Cryotherapy;Electrical Stimulation;Passive range of motion;Traction   PT Next Visit Plan cervical ROM and shoulder ROM, cervical traction, soft tissue work to [employer]; posture at work; shoulder isometrics   PT Home Exercise Plan cane exercise, posture   Recommended Other Services None   Consulted and Agree with Plan of Care Patient          G-Codes - 08/10/15 1233    Functional Assessment Tool Used FOTO score is 46% limitation  goal is 43% limitation   Functional Limitation Changing and maintaining body position   Changing and Maintaining Body Position Current Status (H0865(G8981) At least 40 percent but less than 60 percent impaired, limited or restricted   Changing and Maintaining Body Position Goal Status (H8469(G8982) At least 40 percent but less than 60 percent impaired, limited or restricted       Problem List Patient Active Problem List   Diagnosis Date Noted  . Hyperlipidemia 06/18/2013  . H/O class III angina pectoris 12/12/2012  . CAD (coronary artery disease) of artery bypass graft 12/12/2012  . CKD (chronic kidney disease) stage 3, GFR 30-59 ml/min 12/12/2012  . Ischemic heart disease 08/22/2011  . Diabetes mellitus 08/22/2011  . HTN (hypertension) 08/22/2011  . Obesity 08/22/2011    Calyssa Zobrist,PT 08/10/2015, 1:13 PM  [facility] [address], [address] [place], [place], [address] Phone: [phone]   Fax:  [phone]  Name: Tristan Baker MRN: [id number] Date of Birth: 1940/11/08

## 2015-08-13 ENCOUNTER — Ambulatory Visit: Payer: Medicare Other | Attending: Orthopaedic Surgery | Admitting: Physical Therapy

## 2015-08-13 ENCOUNTER — Other Ambulatory Visit: Payer: Self-pay | Admitting: Cardiology

## 2015-08-13 DIAGNOSIS — R29898 Other symptoms and signs involving the musculoskeletal system: Secondary | ICD-10-CM | POA: Insufficient documentation

## 2015-08-13 DIAGNOSIS — M542 Cervicalgia: Secondary | ICD-10-CM | POA: Insufficient documentation

## 2015-08-13 DIAGNOSIS — M25511 Pain in right shoulder: Secondary | ICD-10-CM | POA: Insufficient documentation

## 2015-08-13 DIAGNOSIS — M436 Torticollis: Secondary | ICD-10-CM | POA: Insufficient documentation

## 2015-08-24 ENCOUNTER — Other Ambulatory Visit: Payer: Self-pay | Admitting: Cardiology

## 2015-08-26 ENCOUNTER — Ambulatory Visit: Payer: Medicare Other | Admitting: Physical Therapy

## 2015-08-26 ENCOUNTER — Encounter: Payer: Self-pay | Admitting: Physical Therapy

## 2015-08-26 DIAGNOSIS — M25511 Pain in right shoulder: Secondary | ICD-10-CM | POA: Diagnosis present

## 2015-08-26 DIAGNOSIS — M542 Cervicalgia: Secondary | ICD-10-CM | POA: Diagnosis present

## 2015-08-26 DIAGNOSIS — R29898 Other symptoms and signs involving the musculoskeletal system: Secondary | ICD-10-CM | POA: Diagnosis not present

## 2015-08-26 DIAGNOSIS — M436 Torticollis: Secondary | ICD-10-CM

## 2015-08-26 NOTE — Patient Instructions (Signed)
Posture Tips DO: - stand tall and erect - keep chin tucked in - keep head and shoulders in alignment - check posture regularly in mirror or large window - pull head back against headrest in car seat;  Change your position often.  Sit with lumbar support. DON'T: - slouch or slump while watching TV or reading - sit, stand or lie in one position  for too long;  Sitting is especially hard on the spine so if you sit at a desk/use the computer, then stand up often!   Copyright  VHI. All rights reserved.  Posture - Standing   Good posture is important. Avoid slouching and forward head thrust. Maintain curve in low back and align ears over shoul- ders, hips over ankles.  Pull your belly button in toward your back bone.   Copyright  VHI. All rights reserved.  Posture - Sitting   Sit upright, head facing forward. Try using a roll to support lower back. Keep shoulders relaxed, and avoid rounded back. Keep hips level with knees. Avoid crossing legs for long periods.   Copyright  VHI. All rights reserved.    Squeeze your shoulder blades together 5x while you are at work!! Do this exercise to get those shoulders back. Do each hour.   Walk right arm up the wall 10x. Practice RELAXING your neck.  Do 3x a day.

## 2015-08-26 NOTE — Therapy (Signed)
[facility] [address] [place], [place], [address] Phone: [phone]   Fax:  [phone]  Physical Therapy Treatment  Patient Details  Name: Tristan Baker MRN: [id number] Date of Birth: 1941-08-30 Referring Provider: Dr. Norlene Campbell  Encounter Date: 08/26/2015      PT End of Session - 08/26/15 0902    Visit Number 2   Number of Visits 10   Date for PT Re-Evaluation 10/05/15   PT Start Time 0855  Pt was late   PT Stop Time 0940   PT Time Calculation (min) 45 min   Activity Tolerance Patient tolerated treatment well   Behavior During Therapy [facility] for tasks assessed/performed      Past Medical History  Diagnosis Date  . Hypertension     INTERMITTENT NONCOMPLIANCE  . Obesity   . OA (osteoarthritis)   . DVT (deep venous thrombosis) (HCC)   . MI, old 22    POSTERIOR M.I.  . RBBB (right bundle branch block)   . Hyperlipidemia   . Anxiety   . Depression   . Renal insufficiency, mild   . Decreased hearing   . Coronary artery disease     a. 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 b. s/p DES of SVG-OM 12/11/12.  . Diabetes mellitus     type 2   . LV dysfunction     EF 45% by echo 12/11/12    Past Surgical History  Procedure Laterality Date  . Cardiac catheterization  01/25/1995    NORMAL  . Coronary artery bypass graft  01/25/1995  . Knee surgery    . Cardiovascular stress test  06/16/2009    EF 43%, INFERIOR LATERAL HYPOKINESIA CONSISTENT WITH PREVIOUS INFARCTION, UNCHANGED FROM 2008. NO EVIDENCE OF ISCHEMIA  . Rotator cuff repair      multiple  . Elbow surgery    . Left heart catheterization with coronary/graft angiogram N/A 12/11/2012    Procedure: LEFT HEART CATHETERIZATION WITH Isabel Caprice;  Surgeon: Peter M Swaziland, MD;  Location: [facility] CATH LAB;  Service: Cardiovascular;  Laterality: N/A;    There were no vitals filed for this visit.  Visit Diagnosis:  Shoulder weakness  Stiffness of  cervical spine  Cervical pain  Pain in joint of right shoulder      Subjective Assessment - 08/26/15 0856    Subjective Shoulder still limited in ROM, neck muscles tender.   Currently in Pain? Yes   Pain Score 3    Pain Location Neck   Pain Orientation Right;Left   Pain Descriptors / Indicators Tender   Aggravating Factors  Putting pressure on it   Pain Relieving Factors Rest                         [facility] Adult PT Treatment/Exercise - 08/26/15 0001    Shoulder Exercises: Standing   Other Standing Exercises Wall walks flexion 8x   Shoulder Exercises: Pulleys   Flexion 3 minutes  VC/TC for technique   Moist Heat Therapy   Number Minutes Moist Heat 15 Minutes   Moist Heat Location Cervical   Electrical Stimulation   Electrical Stimulation Location Cervical   Electrical Stimulation Action IFC   Electrical Stimulation Goals Pain   Manual Therapy   Manual Therapy --  Soft tissue to cervical RT> LT                PT Education - 08/26/15 4132    Education  provided Yes   Education Details HEP, posture ed at desk and inhibiting the upper traps   Person(s) Educated Patient   Methods Explanation;Demonstration;Tactile cues;Verbal cues;Handout   Comprehension Verbalized understanding;Returned demonstration          PT Short Term Goals - 08/26/15 0926    PT SHORT TERM GOAL #1   Title independent with HEP for cane exercises and cervical ROM   Time 4   Period Weeks   Status On-going   PT SHORT TERM GOAL #2   Title cervcial pain with work activities decreased >/= 25%   Time 4   Period Weeks   Status On-going   PT SHORT TERM GOAL #3   Title right shoulder flexion AROM >/= 100 degrees   Time 4   Period Weeks   Status On-going   PT SHORT TERM GOAL #4   Title right shoulder AROM for abduction >/= 90 degrees   Time 4   Period Weeks   Status On-going           PT Long Term Goals - 08/10/15 1310    PT LONG TERM GOAL #1   Title independent  with HEP and understand how to progress himself   Time 8   Period Weeks   Status New   PT LONG TERM GOAL #2   Title cervical ROM improved by 25% so he is able to move his cervical with 50% less pain   Time 8   Period Weeks   Status New   PT LONG TERM GOAL #3   Title right shoulder strength >/= 4/5 so he is able to reach overhead to put deodarant on.   Time 8   Period Weeks   Status New   PT LONG TERM GOAL #4   Title eat with right arm without assistance due to right shoulder strength >/= 4/5   Time 8   Period Weeks   Status New   PT LONG TERM GOAL #5   Title FOTO score </= 43% limitation   Time 8   Period Weeks   Status New               Plan - 08/26/15 [id number]    Clinical Impression Statement Today was pt's first treatment, so there is so change since having his evaluation which was a few weeks ago.  Pt has become accustomed to using his neck muscles due to not being able to lift his RT shoulder actively. His posture remains poor, with rounded shouldes and a forward neck. He has no awareness of this.    Pt will benefit from skilled therapeutic intervention in order to improve on the following deficits Pain;Decreased strength;Decreased mobility;Impaired flexibility;Impaired UE functional use;Increased muscle spasms;Decreased endurance;Decreased range of motion;Increased fascial restricitons   Rehab Potential Good   Clinical Impairments Affecting Rehab Potential None   PT Frequency 2x / week   PT Duration 8 weeks   PT Treatment/Interventions Therapeutic activities;Therapeutic exercise;Moist Heat;Iontophoresis 4mg /ml Dexamethasone;Ultrasound;Manual techniques;Patient/family education;Neuromuscular re-education;Cryotherapy;Electrical Stimulation;Passive range of motion;Traction   PT Next Visit Plan ROM neck, shoulder, manual, posture activites, isometrics for HEP shoulder.   Consulted and Agree with Plan of Care Patient        Problem List Patient Active Problem List    Diagnosis Date Noted  . Hyperlipidemia 06/18/2013  . H/O class III angina pectoris 12/12/2012  . CAD (coronary artery disease) of artery bypass graft 12/12/2012  . CKD (chronic kidney disease) stage 3, GFR 30-59 ml/min 12/12/2012  . Ischemic  heart disease 08/22/2011  . Diabetes mellitus 08/22/2011  . HTN (hypertension) 08/22/2011  . Obesity 08/22/2011    Lukis Bunt, PTA 08/26/2015, 9:31 AM  [facility] [address], [address] [place], [place], [address] Phone: [phone]   Fax:  [phone]  Name: Tristan Baker MRN: [id number] Date of Birth: Feb 17, 1941

## 2015-08-31 ENCOUNTER — Encounter: Payer: Self-pay | Admitting: Physical Therapy

## 2015-08-31 ENCOUNTER — Ambulatory Visit: Payer: Medicare Other | Admitting: Physical Therapy

## 2015-08-31 DIAGNOSIS — R29898 Other symptoms and signs involving the musculoskeletal system: Secondary | ICD-10-CM

## 2015-08-31 DIAGNOSIS — M436 Torticollis: Secondary | ICD-10-CM

## 2015-08-31 DIAGNOSIS — M542 Cervicalgia: Secondary | ICD-10-CM

## 2015-08-31 DIAGNOSIS — M25511 Pain in right shoulder: Secondary | ICD-10-CM

## 2015-08-31 NOTE — Therapy (Signed)
[facility] [address], [address] [place], [place], [address] Phone: [phone]   Fax:  [phone]  Physical Therapy Treatment  Patient Details  Name: Tristan Baker MRN: [id number] Date of Birth: June 23, 1941 Referring Provider: Dr. Norlene Campbell  Encounter Date: 08/31/2015      PT End of Session - 08/31/15 1459    Visit Number 3   Number of Visits 10   Date for PT Re-Evaluation 10/05/15   PT Start Time 1456  10 min late   PT Stop Time 1545   PT Time Calculation (min) 49 min   Activity Tolerance Patient tolerated treatment well   Behavior During Therapy Anxious  worked related      Past Medical History  Diagnosis Date  . Hypertension     INTERMITTENT NONCOMPLIANCE  . Obesity   . OA (osteoarthritis)   . DVT (deep venous thrombosis) (HCC)   . MI, old 26    POSTERIOR M.I.  . RBBB (right bundle branch block)   . Hyperlipidemia   . Anxiety   . Depression   . Renal insufficiency, mild   . Decreased hearing   . Coronary artery disease     a. 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 b. s/p DES of SVG-OM 12/11/12.  . Diabetes mellitus     type 2   . LV dysfunction     EF 45% by echo 12/11/12    Past Surgical History  Procedure Laterality Date  . Cardiac catheterization  01/25/1995    NORMAL  . Coronary artery bypass graft  01/25/1995  . Knee surgery    . Cardiovascular stress test  06/16/2009    EF 43%, INFERIOR LATERAL HYPOKINESIA CONSISTENT WITH PREVIOUS INFARCTION, UNCHANGED FROM 2008. NO EVIDENCE OF ISCHEMIA  . Rotator cuff repair      multiple  . Elbow surgery    . Left heart catheterization with coronary/graft angiogram N/A 12/11/2012    Procedure: LEFT HEART CATHETERIZATION WITH Isabel Caprice;  Surgeon: Peter M Swaziland, MD;  Location: [facility] CATH LAB;  Service: Cardiovascular;  Laterality: N/A;    There were no vitals filed for this visit.  Visit Diagnosis:  Shoulder weakness  Stiffness of  cervical spine  Cervical pain  Pain in joint of right shoulder      Subjective Assessment - 08/31/15 1458    Subjective 10 min late. Thinks the neck felt some better after last treatment.    Currently in Pain? No/denies   Multiple Pain Sites No                         [facility] Adult PT Treatment/Exercise - 08/31/15 0001    Shoulder Exercises: Supine   Flexion Strengthening;Right;20 reps;Weights   Shoulder Flexion Weight (lbs) 2   Other Supine Exercises Flexion 90-120 degrees with 1# 2x 10    Shoulder Exercises: Sidelying   ABduction Strengthening;Right;Weights   ABduction Weight (lbs) 1   ABduction Limitations only 8 reps, starts to use the neck 2 sets   Shoulder Exercises: Standing   Extension Strengthening;Both;15 reps;Theraband   Theraband Level (Shoulder Extension) Level 2 (Red)   Row Strengthening;Both;10 reps;20 reps;Theraband   Theraband Level (Shoulder Row) Level 2 (Red)   Shoulder Exercises: Pulleys   Flexion 3 minutes   Flexion Limitations Improved ability to relax the upper trap.    Shoulder Exercises: ROM/Strengthening   UBE (Upper Arm Bike) L1 3x3   Moist Heat Therapy   Number  Minutes Moist Heat 15 Minutes   Moist Heat Location Cervical   Electrical Stimulation   Electrical Stimulation Location Cervical   Electrical Stimulation Action IFC   Electrical Stimulation Goals Pain                PT Education - 08/31/15 1523    Education provided Yes   Education Details neck AROM HEP   Person(s) Educated Patient   Methods Explanation;Demonstration;Tactile cues;Verbal cues;Handout   Comprehension Verbalized understanding;Returned demonstration          PT Short Term Goals - 08/31/15 1459    PT SHORT TERM GOAL #3   Title right shoulder flexion AROM >/= 100 degrees   Time 4   Period Weeks   Status On-going  will measure after Christmas break   PT SHORT TERM GOAL #4   Title right shoulder AROM for abduction >/= 90 degrees   Time 4    Period Weeks   Status On-going  will measure after Christmas break           PT Long Term Goals - 08/10/15 1310    PT LONG TERM GOAL #1   Title independent with HEP and understand how to progress himself   Time 8   Period Weeks   Status New   PT LONG TERM GOAL #2   Title cervical ROM improved by 25% so he is able to move his cervical with 50% less pain   Time 8   Period Weeks   Status New   PT LONG TERM GOAL #3   Title right shoulder strength >/= 4/5 so he is able to reach overhead to put deodarant on.   Time 8   Period Weeks   Status New   PT LONG TERM GOAL #4   Title eat with right arm without assistance due to right shoulder strength >/= 4/5   Time 8   Period Weeks   Status New   PT LONG TERM GOAL #5   Title FOTO score </= 43% limitation   Time 8   Period Weeks   Status New               Plan - 08/31/15 1505    Clinical Impression Statement Started supine strengthening exercises which pt could do with proper technique. He was challenged mostly in sidelying with abduction. He is doing much better staying out of his neck as he is using his shoulder.    Pt will benefit from skilled therapeutic intervention in order to improve on the following deficits Pain;Decreased strength;Decreased mobility;Impaired flexibility;Impaired UE functional use;Increased muscle spasms;Decreased endurance;Decreased range of motion;Increased fascial restricitons   Rehab Potential Good   Clinical Impairments Affecting Rehab Potential None   PT Frequency 2x / week   PT Duration 8 weeks   PT Treatment/Interventions Therapeutic activities;Therapeutic exercise;Moist Heat;Iontophoresis /ml Dexamethasone;Ultrasound;Manual techniques;Patient/family education;Neuromuscular re-education;Cryotherapy;Electrical Stimulation;Passive range of motion;Traction   PT Next Visit Plan neck ROM, supine shoulder strength continue, give band for HEP that we did today, standing scap attached.          Problem List Patient Active Problem List   Diagnosis Date Noted  . Hyperlipidemia 06/18/2013  . H/O class III angina pectoris 12/12/2012  . CAD (coronary artery disease) of artery bypass graft 12/12/2012  . CKD (chronic kidney disease) stage 3, GFR 30-59 ml/min 12/12/2012  . Ischemic heart disease 08/22/2011  . Diabetes mellitus 08/22/2011  . HTN (hypertension) 08/22/2011  . Obesity 08/22/2011    COCHRAN,JENNIFER, PTA 08/31/2015, 3:34  PM  [facility] [address], [address] [place], [place], [address] Phone: [phone]   Fax:  [phone]  Name: Tristan Baker MRN: [id number] Date of Birth: 11/09/1940

## 2015-08-31 NOTE — Patient Instructions (Signed)
AROM: Neck Rotation   Turn head slowly to look over one shoulder, then the other. Hold each position __2__ seconds. Repeat __10__ times per set. Do __1__ sets per session. Do __3__ sessions per day.  http://orth.exer.us/294   Copyright  VHI. All rights reserved.  AROM: Lateral Neck Flexion   Slowly tilt head toward one shoulder, then the other. Hold each position 2____ seconds. Repeat _10___ times per set. Do ___1_ sets per session. Do ___3_ sessions per day.  http://orth.exer.us/296   Copyright  VH

## 2015-09-16 ENCOUNTER — Encounter: Payer: Self-pay | Admitting: Physical Therapy

## 2015-09-16 ENCOUNTER — Ambulatory Visit: Payer: Medicare Other | Attending: Orthopaedic Surgery | Admitting: Physical Therapy

## 2015-09-16 DIAGNOSIS — M436 Torticollis: Secondary | ICD-10-CM

## 2015-09-16 DIAGNOSIS — R29898 Other symptoms and signs involving the musculoskeletal system: Secondary | ICD-10-CM | POA: Insufficient documentation

## 2015-09-16 DIAGNOSIS — M542 Cervicalgia: Secondary | ICD-10-CM

## 2015-09-16 DIAGNOSIS — M25511 Pain in right shoulder: Secondary | ICD-10-CM | POA: Diagnosis present

## 2015-09-16 NOTE — Therapy (Addendum)
[facility] [address] [place], [place], [address] Phone: [phone]   Fax:  [phone]  Physical Therapy Treatment  Patient Details  Name: Tristan Baker MRN: [id number] Date of Birth: August 10, 1941 Referring Provider: Dr. Joni Fears  Encounter Date: 09/16/2015      PT End of Session - 09/16/15 1237    Visit Number 4   Number of Visits 10   Date for PT Re-Evaluation 10/05/15   PT Start Time 1230   PT Stop Time 1305   PT Time Calculation (min) 35 min   Activity Tolerance Patient tolerated treatment well   Behavior During Therapy [facility] for tasks assessed/performed      Past Medical History  Diagnosis Date  . Hypertension     INTERMITTENT NONCOMPLIANCE  . Obesity   . OA (osteoarthritis)   . DVT (deep venous thrombosis) ([place])   . 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  . RBBB (right bundle branch block)   . Hyperlipidemia   . Anxiety   . Depression   . Renal insufficiency, mild   . Decreased hearing   . Coronary artery disease     a. 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 b. s/p DES of SVG-OM 12/11/12.  . Diabetes mellitus     type 2   . LV dysfunction     EF 45% by echo 12/11/12    Past Surgical History  Procedure Laterality Date  . Cardiac catheterization  01/25/1995    NORMAL  . Coronary artery bypass graft  01/25/1995  . Knee surgery    . Cardiovascular stress test  06/16/2009    EF 43%, INFERIOR LATERAL HYPOKINESIA CONSISTENT WITH PREVIOUS INFARCTION, UNCHANGED FROM 2008. NO EVIDENCE OF ISCHEMIA  . Rotator cuff repair      multiple  . Elbow surgery    . Left heart catheterization with coronary/graft angiogram N/A 12/11/2012    Procedure: LEFT HEART CATHETERIZATION WITH Beatrix Fetters;  Surgeon: Peter M Martinique, MD;  Location: [facility] CATH LAB;  Service: Cardiovascular;  Laterality: N/A;    There were no vitals filed for this visit.  Visit Diagnosis:  Shoulder weakness  Stiffness of cervical  spine  Cervical pain  Pain in joint of right shoulder      Subjective Assessment - 09/16/15 1236    Subjective Just drove back from [place], was tense driving and he felt it in his neck. Still cannot lift arm any better.    Currently in Pain? No/denies   Multiple Pain Sites No            [facility] PT Assessment - 09/16/15 0001    AROM   Right Shoulder Flexion 40 Degrees   Right Shoulder ABduction 40 Degrees   Cervical Flexion decreaased by 25% with pain   Cervical Extension decreased by 75% with pain  in left upper trap   Cervical - Right Side Bend decreased by 25%   Cervical - Left Side Bend decreased by 25%   Cervical - Right Rotation decreased by 25%   Cervical - Left Rotation decreased by 25%   Strength   Right Shoulder Flexion 2+/5  Significant compensation with upper trap   Right Shoulder ABduction 2+/5                     [facility] Adult PT Treatment/Exercise - 09/16/15 0001    Shoulder Exercises: Supine   Horizontal ABduction Strengthening;Both;20 reps;Theraband   Theraband Level (Shoulder Horizontal ABduction)  Level 2 (Red)   Flexion Strengthening;Right;20 reps;Weights   Shoulder Flexion Weight (lbs) 2   ABduction AROM;Right;10 reps  Sidelying   Shoulder Exercises: Sidelying   ABduction Strengthening;Right;10 reps   ABduction Weight (lbs) 0   ABduction Limitations extremely difficult without wts today   Shoulder Exercises: Standing   Extension Strengthening;Both;20 reps;Theraband   Theraband Level (Shoulder Extension) Level 2 (Red)   Row Strengthening;Both;20 reps;Theraband   Theraband Level (Shoulder Row) Level 2 (Red)   Other Standing Exercises wall walk 1# 10x                 PT Education - 09/16/15 1245    Education provided Yes   Education Details HEP, scapular attached with band   Person(s) Educated Patient   Methods Explanation;Demonstration   Comprehension Verbalized understanding;Returned demonstration          PT  Short Term Goals - 09/16/15 1311    PT SHORT TERM GOAL #3   Title right shoulder flexion AROM >/= 100 degrees   Time 4   Period Weeks   Status Not Met   PT SHORT TERM GOAL #4   Title right shoulder AROM for abduction >/= 90 degrees   Time 4   Period Weeks   Status Not Met           PT Long Term Goals - 08/10/15 1310    PT LONG TERM GOAL #1   Title independent with HEP and understand how to progress himself   Time 8   Period Weeks   Status New   PT LONG TERM GOAL #2   Title cervical ROM improved by 25% so he is able to move his cervical with 50% less pain   Time 8   Period Weeks   Status New   PT LONG TERM GOAL #3   Title right shoulder strength >/= 4/5 so he is able to reach overhead to put deodarant on.   Time 8   Period Weeks   Status New   PT LONG TERM GOAL #4   Title eat with right arm without assistance due to right shoulder strength >/= 4/5   Time 8   Period Weeks   Status New   PT LONG TERM GOAL #5   Title FOTO score </= 43% limitation   Time 8   Period Weeks   Status New               Plan - 09/16/15 1305    Clinical Impression Statement Pt still cannot lift his arm and in fact seems a little worse tha non eval. He is frustrated mainly with the lack of use of his RTUE. He does feel his neck feels better. He reports that he would like to return back to MD for follow up at this time.    Pt will benefit from skilled therapeutic intervention in order to improve on the following deficits Pain;Decreased strength;Decreased mobility;Impaired flexibility;Impaired UE functional use;Increased muscle spasms;Decreased endurance;Decreased range of motion;Increased fascial restricitons   Rehab Potential Good   PT Frequency 2x / week   PT Duration 8 weeks   PT Treatment/Interventions Therapeutic activities;Therapeutic exercise;Moist Heat;Iontophoresis 47m/ml Dexamethasone;Ultrasound;Manual techniques;Patient/family education;Neuromuscular  re-education;Cryotherapy;Electrical Stimulation;Passive range of motion;Traction   PT Next Visit Plan Pt will schedule appt with MD for follow up due to no progress with his shoulder.    Consulted and Agree with Plan of Care Patient        Problem List Patient Active Problem List  Diagnosis Date Noted  . Hyperlipidemia 06/18/2013  . H/O class III angina pectoris 12/12/2012  . CAD (coronary artery disease) of artery bypass graft 12/12/2012  . CKD (chronic kidney disease) stage 3, GFR 30-59 ml/min 12/12/2012  . Ischemic heart disease 08/22/2011  . Diabetes mellitus 08/22/2011  . HTN (hypertension) 08/22/2011  . Obesity 08/22/2011    Haylee Mcanany, PTA 2015/09/23, 1:13 PM  G-codes: Changing Position Goals Status: CK D/C Status CK  PHYSICAL THERAPY DISCHARGE SUMMARY  Visits from Start of Care: 4  Current functional level related to goals / functional outcomes: Pt didn't return after visit on 2015/09/23.  Current status not known.   Remaining deficits: Unknown  Education / Equipment: HEP Plan: Patient agrees to discharge.  Patient goals were partially met. Patient is being discharged due to not returning since the last visit.  ?????   Sigurd Sos, PT 11/09/2015 3:12 PM  [facility] [address] [place], [place], [address] Phone: [phone]   Fax:  [phone]  Name: Treysen Sudbeck MRN: [id number] Date of Birth: [birth year]

## 2015-09-16 NOTE — Patient Instructions (Signed)
                           Strengthening: Resisted Extension   Hold tubing in right hand, arm forward. Pull arm back, elbow straight. Repeat _10___ times per set. Do _2___ sets per session. Do _1-2___ sessions per day.  Can place band around the front of your body and perform with both arms at the same time.   PNF Strengthening: Resisted   Standing with resistive band around each hand, bring right arm up and away, thumb back. Repeat _10___ times per set. Do _2___ sets per session. Do _1-2___ sessions per day.      Resisted Horizontal Abduction: Bilateral   Sit or stand, tubing in both hands, arms out in front. Keeping arms straight, pinch shoulder blades together and stretch arms out. Repeat _10___ times per set. Do 2____ sets per session. Do _1-2___ sessions per day.                  Scapular Retraction: Elbow Flexion (Standing)   With elbows bent to 90, pinch shoulder blades together and rotate arms out, keeping elbows bent. Repeat _10___ times per set. Do _1___ sets per session. Do many____ sessions per day.    Strengthening: Resisted Extension   Hold tubing in right hand, arm forward. Pull arm back, elbow straight. Repeat _10___ times per set. Do _2___ sets per session. Do _1-2___ sessions per day.  Can place band around the front of your body and perform with both arms at the same time.    PNF Strengthening: Resisted   Standing with resistive band around each hand, bring right arm up and away, thumb back. Repeat _10___ times per set. Do _2___ sets per session. Do _1-2___ sessions per day.      Resisted Horizontal Abduction: Bilateral   Sit or stand, tubing in both hands, arms out in front. Keeping arms straight, pinch shoulder blades together and stretch arms out. Repeat _10___ times per set. Do 2____ sets per session. Do _1-2___ sessions per day.                  Scapular Retraction: Elbow Flexion  (Standing)   With elbows bent to 90, pinch shoulder blades together and rotate arms out, keeping elbows bent. Repeat _10___ times per set. Do _1___ sets per session. Do many____ sessions per day.    Strengthening: Resisted Extension   Hold tubing in right hand, arm forward. Pull arm back, elbow straight. Repeat _10___ times per set. Do _2___ sets per session. Do _1-2___ sessions per day.  Can place band around the front of your body and perform with both arms at the same time.      http://ss.exer.us/290   Copyright  VHI. All rights reserved.  Resistive Band Rowing   With resistive band anchored in door, grasp both ends. Keeping elbows bent, pull back, squeezing shoulder blades together. Hold __2__ seconds. Repeat ___10_ times. Do ___3_ sessions per day.  http://gt2.exer.us/97   Copyright  VHI. All rights reserved.

## 2015-10-09 ENCOUNTER — Other Ambulatory Visit: Payer: Self-pay | Admitting: *Deleted

## 2015-10-09 MED ORDER — ATORVASTATIN CALCIUM 40 MG PO TABS: 40.0000 mg | ORAL_TABLET | Freq: Every day | ORAL | Status: DC

## 2015-10-13 ENCOUNTER — Other Ambulatory Visit: Payer: Self-pay | Admitting: *Deleted

## 2015-10-13 MED ORDER — ATORVASTATIN CALCIUM 40 MG PO TABS: 40.0000 mg | ORAL_TABLET | Freq: Every day | ORAL | Status: DC

## 2015-12-21 ENCOUNTER — Ambulatory Visit: Payer: Medicare Other | Admitting: Diagnostic Neuroimaging

## 2015-12-22 ENCOUNTER — Encounter: Payer: Self-pay | Admitting: Diagnostic Neuroimaging

## 2015-12-28 ENCOUNTER — Ambulatory Visit (INDEPENDENT_AMBULATORY_CARE_PROVIDER_SITE_OTHER): Payer: Medicare Other | Admitting: Internal Medicine

## 2015-12-28 VITALS — BP 130/80 | HR 97 | Temp 98.4°F | Resp 18 | Ht 67.0 in | Wt 201.6 lb

## 2015-12-28 DIAGNOSIS — B349 Viral infection, unspecified: Secondary | ICD-10-CM

## 2015-12-28 DIAGNOSIS — R51 Headache: Secondary | ICD-10-CM | POA: Diagnosis not present

## 2015-12-28 DIAGNOSIS — R059 Cough, unspecified: Secondary | ICD-10-CM

## 2015-12-28 DIAGNOSIS — R05 Cough: Secondary | ICD-10-CM

## 2015-12-28 DIAGNOSIS — R509 Fever, unspecified: Secondary | ICD-10-CM | POA: Diagnosis not present

## 2015-12-28 LAB — POCT INFLUENZA A/B
Influenza A, POC: NEGATIVE
Influenza B, POC: NEGATIVE

## 2015-12-28 MED ORDER — HYDROCODONE-HOMATROPINE 5-1.5 MG/5ML PO SYRP: 5.0000 mL | ORAL_SOLUTION | Freq: Four times a day (QID) | ORAL | Status: DC | PRN

## 2015-12-28 MED ORDER — HYDROCOD POLST-CPM POLST ER 10-8 MG/5ML PO SUER: 5.0000 mL | Freq: Two times a day (BID) | ORAL | Status: DC | PRN

## 2015-12-28 NOTE — Patient Instructions (Signed)
     IF you received an x-ray today, you will receive an invoice from [facility]. Please contact [facility] at [phone] with questions or concerns regarding your invoice.   IF you received labwork today, you will receive an invoice from [facility]. Please contact [facility] at [phone] with questions or concerns regarding your invoice.   Our billing staff will not be able to assist you with questions regarding bills from these companies.  You will be contacted with the lab results as soon as they are available. The fastest way to get your results is to activate your My Chart account. Instructions are located on the last page of this paperwork. If you have not heard from us regarding the results in 2 weeks, please contact this office.      

## 2015-12-28 NOTE — Addendum Note (Signed)
Addended by: Tonye PearsonOLITTLE, Chanteria Haggard P on: 12/28/2015 05:44 PM   Modules accepted: Orders

## 2015-12-28 NOTE — Progress Notes (Signed)
By signing my name below I, Shelah Lewandowsky, attest that this documentation has been prepared under the direction and in the presence of Ellamae Sia, MD. Electonically Signed. Shelah Lewandowsky, Scribe 12/28/2015 at 4:58 PM  Subjective:    Patient ID: Tristan Baker, male    DOB: Jan 22, 1941, 75 y.o.   MRN: [id number] Chief Complaint  Patient presents with  . Cough    x8 days, deep cough with brownish, greenish mucus    Cough Associated symptoms include a fever and headaches.   Tristan Baker is a 75 y.o. male who presents to the [facility] complaining of productive cough for the past 5 days with brown/green phlegm. Pt states prior to cough he felt weak and has his cough developed he started having fever and general malaise. Pt also reports having a HA due to cough. Pt also reports wheezing. Pt states malaise and cough are mildly improved today.  Pt also reports having chronic nasal congestion.  Pt denies getting the flu shot this year.   Patient Active Problem List   Diagnosis Date Noted  . Hyperlipidemia 06/18/2013  . H/O class III angina pectoris 12/12/2012  . CAD (coronary artery disease) of artery bypass graft 12/12/2012  . CKD (chronic kidney disease) stage 3, GFR 30-59 ml/min 12/12/2012  . Ischemic heart disease 08/22/2011  . Diabetes mellitus 08/22/2011  . HTN (hypertension) 08/22/2011  . Obesity 08/22/2011    Current outpatient prescriptions:  .  amLODipine (NORVASC) 5 MG tablet, Take 5 mg by mouth daily.  , Disp: , Rfl:  .  aspirin 81 MG tablet, Take 1 tablet (81 mg total) by mouth daily., Disp: , Rfl:  .  atorvastatin (LIPITOR) 40 MG tablet, Take 1 tablet (40 mg total) by mouth daily., Disp: 30 tablet, Rfl: 0 .  gabapentin (NEURONTIN) 300 MG capsule, Take 1 capsule (300 mg total) by mouth at bedtime., Disp: , Rfl:  .  glipiZIDE (GLUCOTROL) 10 MG tablet, Take 10 mg by mouth 2 (two) times daily., Disp: , Rfl: 6 .  Insulin Glargine (TOUJEO SOLOSTAR [place]),  Inject 40 Units into the skin daily., Disp: , Rfl:  .  lisinopril (PRINIVIL,ZESTRIL) 10 MG tablet, Take 10 mg by mouth daily., Disp: , Rfl:  .  metoprolol succinate (TOPROL XL) 25 MG 24 hr tablet, Take 1 tablet (25 mg total) by mouth daily. NEEDS TO MAKE APPT FOR FURTHER REFILLS, Disp: 30 tablet, Rfl: 1 .  Multiple Vitamin (MULTIVITAMIN) tablet, Take 1 tablet by mouth daily.  , Disp: , Rfl:  .  nitroGLYCERIN (NITROSTAT) 0.4 MG SL tablet, Place 1 tablet (0.4 mg total) under the tongue every 5 (five) minutes as needed for chest pain (up to 3 doses)., Disp: , Rfl:  .  sertraline (ZOLOFT) 100 MG tablet, Take 100 mg by mouth daily.  , Disp: , Rfl:  .  traMADol (ULTRAM) 50 MG tablet, Take 50 mg by mouth every 8 (eight) hours as needed. for pain, Disp: , Rfl: 1 .  zonisamide (ZONEGRAN) 100 MG capsule, TAKE 2 CAPSULES BY MOUTH AT BEDTIME (Patient not taking: Reported on 08/10/2015), Disp: 30 capsule, Rfl: 0  Allergies  Allergen Reactions  . Choline Fenofibrate     Trilpax   Social History   Social History  . Marital Status: Married    Spouse Name: Graciella Belton  . Number of Children: 4  . Years of Education: college   Occupational History  .       [employer]   Social History  Main Topics  . Smoking status: Never Smoker   . Smokeless tobacco: Never Used  . Alcohol Use: No  . Drug Use: No  . Sexual Activity: Not Currently   Other Topics Concern  . Not on file   Social History Narrative   Patient lives at home with his spouse.   Caffeine Use: 2 cups daily; ice tea or soda daily      Review of Systems  Constitutional: Positive for fever.  HENT: Positive for congestion.   Respiratory: Positive for cough.   Neurological: Positive for headaches.       Objective:   Physical Exam  Constitutional: He is oriented to person, place, and time. He appears well-developed and well-nourished. No distress.  HENT:  Head: Normocephalic and atraumatic.  Mouth/Throat: Oropharynx is clear and  moist.  Clear rhinorrhea  Eyes: Conjunctivae are normal. Pupils are equal, round, and reactive to light.  Neck: Neck supple.  Cardiovascular: Normal rate, regular rhythm and normal heart sounds.  Exam reveals no gallop.   No murmur heard. Pulmonary/Chest: Effort normal and breath sounds normal. He has no decreased breath sounds. He has no wheezes. He has no rhonchi. He has no rales.  Musculoskeletal: Normal range of motion.  Lymphadenopathy:    He has no cervical adenopathy.  Neurological: He is alert and oriented to person, place, and time.  Skin: Skin is warm and dry.  Psychiatric: He has a normal mood and affect. His behavior is normal.  Nursing note and vitals reviewed.  Filed Vitals:   12/28/15 1615  BP: 130/80  Pulse: 97  Temp: 98.4 F (36.9 C)  TempSrc: Oral  Resp: 18  Height: 5\' 7"  (1.702 m)  Weight: 201 lb 9.6 oz (91.445 kg)  SpO2: 96%    Results for orders placed or performed in visit on 12/28/15  POCT Influenza A/B  Result Value Ref Range   Influenza A, POC Negative Negative   Influenza B, POC Negative Negative        Assessment & Plan:  I have completed the patient encounter in its entirety as documented by the scribe, with editing by me where necessary. Robert P. Merla Richesoolittle, M.D. Cough - Plan: POCT Influenza A/B  Viral syndrome  Meds ordered this encounter  Medications  . HYDROcodone-homatropine (HYCODAN) 5-1.5 MG/5ML syrup    Sig: Take 5 mLs by mouth every 6 (six) hours as needed.    Dispense:  120 mL    Refill:  0   F/u at once if develops chills /fever/productive cough

## 2016-01-05 ENCOUNTER — Ambulatory Visit (INDEPENDENT_AMBULATORY_CARE_PROVIDER_SITE_OTHER): Payer: Medicare Other | Admitting: Family Medicine

## 2016-01-05 ENCOUNTER — Ambulatory Visit (INDEPENDENT_AMBULATORY_CARE_PROVIDER_SITE_OTHER): Payer: Medicare Other

## 2016-01-05 VITALS — BP 108/78 | HR 99 | Temp 98.0°F | Resp 18 | Wt 206.0 lb

## 2016-01-05 DIAGNOSIS — R0609 Other forms of dyspnea: Secondary | ICD-10-CM

## 2016-01-05 DIAGNOSIS — N183 Chronic kidney disease, stage 3 unspecified: Secondary | ICD-10-CM

## 2016-01-05 DIAGNOSIS — R05 Cough: Secondary | ICD-10-CM

## 2016-01-05 DIAGNOSIS — I4510 Unspecified right bundle-branch block: Secondary | ICD-10-CM | POA: Diagnosis not present

## 2016-01-05 DIAGNOSIS — R059 Cough, unspecified: Secondary | ICD-10-CM

## 2016-01-05 DIAGNOSIS — R9431 Abnormal electrocardiogram [ECG] [EKG]: Secondary | ICD-10-CM | POA: Diagnosis not present

## 2016-01-05 DIAGNOSIS — I25810 Atherosclerosis of coronary artery bypass graft(s) without angina pectoris: Secondary | ICD-10-CM | POA: Diagnosis not present

## 2016-01-05 LAB — POCT CBC
Granulocyte percent: 78.1 %G (ref 37–80)
HEMATOCRIT: 40.1 % — AB (ref 43.5–53.7)
Hemoglobin: 13.6 g/dL — AB (ref 14.1–18.1)
LYMPH, POC: 1.2 (ref 0.6–3.4)
MCH, POC: 31.2 pg (ref 27–31.2)
MCHC: 33.9 g/dL (ref 31.8–35.4)
MCV: 92 fL (ref 80–97)
MID (CBC): 0.5 (ref 0–0.9)
MPV: 7.3 fL (ref 0–99.8)
POC GRANULOCYTE: 6.1 (ref 2–6.9)
POC LYMPH %: 16 % (ref 10–50)
POC MID %: 5.9 % (ref 0–12)
Platelet Count, POC: 256 10*3/uL (ref 142–424)
RBC: 4.36 M/uL — AB (ref 4.69–6.13)
RDW, POC: 15.7 %
WBC: 7.8 10*3/uL (ref 4.6–10.2)

## 2016-01-05 LAB — BASIC METABOLIC PANEL
BUN: 23 mg/dL (ref 7–25)
CHLORIDE: 105 mmol/L (ref 98–110)
CO2: 29 mmol/L (ref 20–31)
Calcium: 9.4 mg/dL (ref 8.6–10.3)
Creat: 1.91 mg/dL — ABNORMAL HIGH (ref 0.70–1.18)
Glucose, Bld: 111 mg/dL — ABNORMAL HIGH (ref 65–99)
POTASSIUM: 5.2 mmol/L (ref 3.5–5.3)
SODIUM: 141 mmol/L (ref 135–146)

## 2016-01-05 MED ORDER — FUROSEMIDE 40 MG PO TABS: 40.0000 mg | ORAL_TABLET | Freq: Every day | ORAL | Status: DC

## 2016-01-05 MED ORDER — POTASSIUM CHLORIDE CRYS ER 20 MEQ PO TBCR: 20.0000 meq | EXTENDED_RELEASE_TABLET | Freq: Every day | ORAL | Status: DC

## 2016-01-05 MED ORDER — AZITHROMYCIN 500 MG PO TABS: 500.0000 mg | ORAL_TABLET | Freq: Every day | ORAL | Status: DC

## 2016-01-05 MED ORDER — HYDROCODONE-HOMATROPINE 5-1.5 MG/5ML PO SYRP: 5.0000 mL | ORAL_SOLUTION | Freq: Three times a day (TID) | ORAL | Status: DC | PRN

## 2016-01-05 NOTE — Progress Notes (Addendum)
Subjective:    Patient ID: Tristan Baker, male    DOB: 04/17/1941, 75 y.o.   MRN: [id number] By signing my name below, I, Littie Deedsichard Sun, attest that this documentation has been prepared under the direction and in the presence of Norberto SorensonEva Rodneisha Bonnet, MD.  Electronically Signed: Littie Deedsichard Sun, Medical Scribe. 01/05/2016. 2:31 PM.  Chief Complaint  Patient presents with  . Follow-up    cough    HPI HPI Comments: Tristan Baker is a 75 y.o. male with a history of CAD and IDDM who presents to the [facility] for a follow-up for cough. He was seen 8 days ago for viral bronchitis. At that time, he had 5 days of cough productive of brown green phlegm, weakness, fever, headache, and wheezing. Flu was negative. He was put on Tussionex to treat symptomatically. Today, patient states his symptoms have not improved. He has noticed SOB, noting that he feels winded when walking across the yard. He also reports having chest pain which he associates with the cough and states it is not a cardiac chest pain. Patient denies fever and chills currently. He also denies history of smoking. He has not used an inhaler before. He is followed by a cardiology, Dr. Jens Somrenshaw but has not seen him in years.  he has been sitting in his recliner to sleep for the past few weeks as he does endorse some orthopnea.   Last saw his PCP 1 month ago and has follow-up with a nurse practitioner on May 17th, but reports they mainly check his diabetes.  Patient states he is no longer taking the glipizide and is taking a different insulin (47 units a day), which he does not remember the name of.  Of note, pt's wife called the office while I was examining pt and requested that he have an EKG performed.  Past Medical History  Diagnosis Date  . Hypertension     INTERMITTENT NONCOMPLIANCE  . Obesity   . OA (osteoarthritis)   . DVT (deep venous thrombosis) (HCC)   . 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  . RBBB (right bundle branch block)     . Hyperlipidemia   . Anxiety   . Depression   . Renal insufficiency, mild   . Decreased hearing   . Coronary artery disease     a. 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 b. s/p DES of SVG-OM 12/11/12.  . Diabetes mellitus     type 2   . LV dysfunction     EF 45% by echo 12/11/12   Past Surgical History  Procedure Laterality Date  . Cardiac catheterization  01/25/1995    NORMAL  . Coronary artery bypass graft  01/25/1995  . Knee surgery    . Cardiovascular stress test  06/16/2009    EF 43%, INFERIOR LATERAL HYPOKINESIA CONSISTENT WITH PREVIOUS INFARCTION, UNCHANGED FROM 2008. NO EVIDENCE OF ISCHEMIA  . Rotator cuff repair      multiple  . Elbow surgery    . Left heart catheterization with coronary/graft angiogram N/A 12/11/2012    Procedure: LEFT HEART CATHETERIZATION WITH Isabel CapriceORONARY/GRAFT ANGIOGRAM;  Surgeon: Peter M SwazilandJordan, MD;  [facility];  Service: Cardiovascular;  Laterality: N/A;   Current Outpatient Prescriptions on File Prior to Visit  Medication Sig Dispense Refill  . amLODipine (NORVASC) 5 MG tablet Take 5 mg by mouth daily.      Marland Kitchen. aspirin 81 MG tablet Take 1 tablet (81 mg total) by mouth  daily.    . atorvastatin (LIPITOR) 40 MG tablet Take 1 tablet (40 mg total) by mouth daily. 30 tablet 0  . gabapentin (NEURONTIN) 300 MG capsule Take 1 capsule (300 mg total) by mouth at bedtime.    . Insulin Glargine (TOUJEO SOLOSTAR [place]) Inject 40 Units into the skin daily.    Marland Kitchen lisinopril (PRINIVIL,ZESTRIL) 10 MG tablet Take 10 mg by mouth daily.    . metoprolol succinate (TOPROL XL) 25 MG 24 hr tablet Take 1 tablet (25 mg total) by mouth daily. NEEDS TO MAKE APPT FOR FURTHER REFILLS 30 tablet 1  . Multiple Vitamin (MULTIVITAMIN) tablet Take 1 tablet by mouth daily.      . nitroGLYCERIN (NITROSTAT) 0.4 MG SL tablet Place 1 tablet (0.4 mg total) under the tongue every 5 (five) minutes as needed for chest pain (up to 3 doses).    . sertraline (ZOLOFT) 100 MG tablet Take 100 mg by mouth  daily.      . traMADol (ULTRAM) 50 MG tablet Take 50 mg by mouth every 8 (eight) hours as needed. for pain  1  . zonisamide (ZONEGRAN) 100 MG capsule TAKE 2 CAPSULES BY MOUTH AT BEDTIME 30 capsule 0   No current facility-administered medications on file prior to visit.   Allergies  Allergen Reactions  . Choline Fenofibrate     Trilpax   Family History  Problem Relation Age of Onset  . Stroke Mother   . Heart disease Father    Social History   Social History  . Marital Status: Married    Spouse Name: Graciella Belton  . Number of Children: 4  . Years of Education: college   Occupational History  .       [employer]   Social History Main Topics  . Smoking status: Never Smoker   . Smokeless tobacco: Never Used  . Alcohol Use: No  . Drug Use: No  . Sexual Activity: Not Currently   Other Topics Concern  . None   Social History Narrative   Patient lives at home with his spouse.   Caffeine Use: 2 cups daily; ice tea or soda daily       Review of Systems  Constitutional: Positive for diaphoresis, activity change, appetite change, fatigue and unexpected weight change (gained 10 lbs in the past yr). Negative for fever and chills.  HENT: Positive for hearing loss.   Respiratory: Positive for cough, shortness of breath and wheezing. Negative for choking and chest tightness.   Cardiovascular: Positive for chest pain ("doesn't feel like my heart"), palpitations and leg swelling.  Skin: Negative for rash and wound.  Allergic/Immunologic: Negative for environmental allergies.  Neurological: Positive for light-headedness. Negative for dizziness.  Hematological: Negative for adenopathy.  Psychiatric/Behavioral: Positive for sleep disturbance.       Objective:  BP 108/78 mmHg  Pulse 99  Temp(Src) 98 F (36.7 C) (Oral)  Resp 18  Wt 206 lb (93.441 kg)  SpO2 95%  Physical Exam  Constitutional: He is oriented to person, place, and time. He appears well-developed and well-nourished.  No distress.  HENT:  Head: Normocephalic and atraumatic.  Right Ear: Tympanic membrane normal.  Left Ear: Tympanic membrane normal.  Mouth/Throat: Oropharynx is clear and moist. No oropharyngeal exudate.  Rhinitis. Oropharynx normal.  Eyes: Pupils are equal, round, and reactive to light.  Neck: Neck supple. No thyromegaly present.  Normal thyroid.  Cardiovascular: Normal rate.  An irregularly irregular rhythm present.  2+ pitting edema to knees bilaterally  Pulmonary/Chest:  Effort normal. He has rales (fine inspiratory faint in LLL).  Decreased bronchiole breath sounds in the right lower lobe.  Musculoskeletal: He exhibits edema.  Lymphadenopathy:    He has no cervical adenopathy.  Neurological: He is alert and oriented to person, place, and time. No cranial nerve deficit.  Skin: Skin is warm and dry. No rash noted.  Psychiatric: He has a normal mood and affect. His behavior is normal.  Nursing note and vitals reviewed.   EKG - History of RBBB which is persistent with flipped T-waves in V1 - V3 which are unchanged from prior EKG 03/27/2014. Very low voltage in limb leads.  Dg Chest 2 View  01/05/2016  CLINICAL DATA:  Productive cough, weakness, fever headache EXAM: CHEST  2 VIEW COMPARISON:  12/11/2012 FINDINGS: Previous coronal bypass changes. Lower lung volumes with small bilateral pleural effusions and bibasilar atelectasis versus airspace disease. Pneumonia is not excluded. Upper lobes remain clear. Mild cardiac enlargement. Atherosclerosis noted of the aorta. Trachea is midline. Previous surgery at the right shoulder current minor degenerative changes of the spine IMPRESSION: Small pleural effusions with bibasilar atelectasis versus airspace disease. Previous coronal bypass Cardiomegaly Electronically Signed   By: Judie Petit.  Shick M.D.   On: 01/05/2016 14:53    Results for orders placed or performed in visit on 01/05/16  Brain natriuretic peptide  Result Value Ref Range   Brain  Natriuretic Peptide 1597.7 (H) <100 pg/mL  Sedimentation rate  Result Value Ref Range   Sed Rate 30 (H) 0 - 20 mm/hr  Basic metabolic panel  Result Value Ref Range   Sodium 141 135 - 146 mmol/L   Potassium 5.2 3.5 - 5.3 mmol/L   Chloride 105 98 - 110 mmol/L   CO2 29 20 - 31 mmol/L   Glucose, Bld 111 (H) 65 - 99 mg/dL   BUN 23 7 - 25 mg/dL   Creat 9.60 (H) 4.54 - 1.18 mg/dL   Calcium 9.4 8.6 - 09.8 mg/dL  POCT CBC  Result Value Ref Range   WBC 7.8 4.6 - 10.2 K/uL   Lymph, poc 1.2 0.6 - 3.4   POC LYMPH PERCENT 16.0 10 - 50 %L   MID (cbc) 0.5 0 - 0.9   POC MID % 5.9 0 - 12 %M   POC Granulocyte 6.1 2 - 6.9   Granulocyte percent 78.1 37 - 80 %G   RBC 4.36 (A) 4.69 - 6.13 M/uL   Hemoglobin 13.6 (A) 14.1 - 18.1 g/dL   HCT, POC 11.9 (A) 14.7 - 53.7 %   MCV 92.0 80 - 97 fL   MCH, POC 31.2 27 - 31.2 pg   MCHC 33.9 31.8 - 35.4 g/dL   RDW, POC 82.9 %   Platelet Count, POC 256 142 - 424 K/uL   MPV 7.3 0 - 99.8 fL       Assessment & Plan:   1. Dyspnea on exertion - concern this could be due to exacerbation of CHF rather than a URI due to reports of severe DOE with no h/o lung disease, wbc normal, cxr shows some fluid bibasilar, EKG very abnml - covered with azithro 500 qd x 3d but also start lasix 40 with K - I think pt is relatively lasix naive, no salt, minimize free water. Recheck in 3d, sooner if worse.  2. Cough - try hycodan as did well on tussionex but to expensive - call if tessalon pearles instead prn  3. Nonspecific abnormal electrocardiogram (ECG) (EKG) - pt needs  to f/u with cards asap - has not been seen in a while so placed new referral  4. Right bundle branch block   5. Coronary artery disease involving coronary bypass graft of native heart without angina pectoris   6. CKD (chronic kidney disease) stage 3, GFR 30-59 ml/min - recheck since starting lasix    ADDENDUM: Initially instructed pt to recheck with me in 3d if improving, sooner if worse but labs returned showing  markedly elevated bnp confirming acute exacerbation of CHF as cause of sxs.  As pt also has CKD will need to keep VERY close monitoring if he wants to have a chance to avoid hosp.  If worse at all -> 911 to ER.  If better, RTC TODAY for repeat eval and needs to see cardiology this week. F/u w/ pcp asap as well. Orders Placed This Encounter  Procedures  . DG Chest 2 View    Standing Status: Future     Number of Occurrences: 1     Standing Expiration Date: 01/04/2017    Order Specific Question:  Reason for Exam (SYMPTOM  OR DIAGNOSIS REQUIRED)    Answer:  cough x 2 wks, decrease breath sounds RLL    Order Specific Question:  Preferred imaging location?    Answer:  External  . Brain natriuretic peptide  . Sedimentation rate  . Basic metabolic panel    Order Specific Question:  Has the patient fasted?    Answer:  No  . Ambulatory referral to Cardiology    Referral Priority:  Urgent    Referral Type:  Consultation    Referral Reason:  Specialty Services Required    Requested Specialty:  Cardiology    Number of Visits Requested:  1  . POCT CBC  . EKG 12-Lead    Meds ordered this encounter  Medications  . azithromycin (ZITHROMAX) 500 MG tablet    Sig: Take 1 tablet (500 mg total) by mouth daily.    Dispense:  3 tablet    Refill:  0  . HYDROcodone-homatropine (HYCODAN) 5-1.5 MG/5ML syrup    Sig: Take 5 mLs by mouth every 8 (eight) hours as needed for cough.    Dispense:  120 mL    Refill:  0  . furosemide (LASIX) 40 MG tablet    Sig: Take 1 tablet (40 mg total) by mouth daily.    Dispense:  30 tablet    Refill:  0  . potassium chloride SA (K-DUR,KLOR-CON) 20 MEQ tablet    Sig: Take 1 tablet (20 mEq total) by mouth daily. Along with the furosemide (lasix)    Dispense:  30 tablet    Refill:  0    I personally performed the services described in this documentation, which was scribed in my presence. The recorded information has been reviewed and considered, and addended by me as  needed.  Norberto Sorenson, MD MPH

## 2016-01-05 NOTE — Patient Instructions (Addendum)
Do not eat salt and try not to drink to much free water.   Recheck in 2-3d - sooner if worse. Urgent referral to cardiology - I am concerned there is fluid in your lungs from your heart rather than from infection.   IF you received an x-ray today, you will receive an invoice from [facility]. Please contact [facility] at [phone] with questions or concerns regarding your invoice.   IF you received labwork today, you will receive an invoice from [facility]. Please contact [facility] at [phone] with questions or concerns regarding your invoice.   Our billing staff will not be able to assist you with questions regarding bills from these companies.  You will be contacted with the lab results as soon as they are available. The fastest way to get your results is to activate your My Chart account. Instructions are located on the last page of this paperwork. If you have not heard from us regarding the results in 2 weeks, please contact this office.    Heart Failure Heart failure means your heart has trouble pumping blood. This makes it hard for your body to work well. Heart failure is usually a long-term (chronic) condition. You must take good care of yourself and follow your doctor's treatment plan. HOME CARE  Take your heart medicine as told by your doctor.  Do not stop taking medicine unless your doctor tells you to.  Do not skip any dose of medicine.  Refill your medicines before they run out.  Take other medicines only as told by your doctor or pharmacist.  Stay active if told by your doctor. The elderly and people with severe heart failure should talk with a doctor about physical activity.  Eat heart-healthy foods. Choose foods that are without trans fat and are low in saturated fat, cholesterol, and salt (sodium). This includes fresh or frozen fruits and vegetables, fish, lean meats, fat-free or low-fat dairy foods, whole grains, and  high-fiber foods. Lentils and dried peas and beans (legumes) are also good choices.  Limit salt if told by your doctor.  Cook in a healthy way. Roast, grill, broil, bake, poach, steam, or stir-fry foods.  Limit fluids as told by your doctor.  Weigh yourself every morning. Do this after you pee (urinate) and before you eat breakfast. Write down your weight to give to your doctor.  Take your blood pressure and write it down if your doctor tells you to.  Ask your doctor how to check your pulse. Check your pulse as told.  Lose weight if told by your doctor.  Stop smoking or chewing tobacco. Do not use gum or patches that help you quit without your doctor's approval.  Schedule and go to doctor visits as told.  Nonpregnant women should have no more than 1 drink a day. Men should have no more than 2 drinks a day. Talk to your doctor about drinking alcohol.  Stop illegal drug use.  Stay current with shots (immunizations).  Manage your health conditions as told by your doctor.  Learn to manage your stress.  Rest when you are tired.  If it is really hot outside:  Avoid intense activities.  Use air conditioning or fans, or get in a cooler place.  Avoid caffeine and alcohol.  Wear loose-fitting, lightweight, and light-colored clothing.  If it is really cold outside:  Avoid intense activities.  Layer your clothing.  Wear mittens or gloves, a hat, and a scarf when going outside.  Avoid  alcohol.  Learn about heart failure and get support as needed.  Get help to maintain or improve your quality of life and your ability to care for yourself as needed. GET HELP IF:   You gain weight quickly.  You are more short of breath than usual.  You cannot do your normal activities.  You tire easily.  You cough more than normal, especially with activity.  You have any or more puffiness (swelling) in areas such as your hands, feet, ankles, or belly (abdomen).  You cannot sleep  because it is hard to breathe.  You feel like your heart is beating fast (palpitations).  You get dizzy or light-headed when you stand up. GET HELP RIGHT AWAY IF:   You have trouble breathing.  There is a change in mental status, such as becoming less alert or not being able to focus.  You have chest pain or discomfort.  You faint. MAKE SURE YOU:   Understand these instructions.  Will watch your condition.  Will get help right away if you are not doing well or get worse.   This information is not intended to replace advice given to you by your health care provider. Make sure you discuss any questions you have with your health care provider.   Document Released: 06/07/2008 Document Revised: 09/19/2014 Document Reviewed: 10/15/2012 Elsevier Interactive Patient Education [employer]

## 2016-01-06 LAB — SEDIMENTATION RATE: Sed Rate: 30 mm/hr — ABNORMAL HIGH (ref 0–20)

## 2016-01-06 LAB — BRAIN NATRIURETIC PEPTIDE: BRAIN NATRIURETIC PEPTIDE: 1597.7 pg/mL — AB (ref <100)

## 2016-01-08 ENCOUNTER — Ambulatory Visit (INDEPENDENT_AMBULATORY_CARE_PROVIDER_SITE_OTHER): Payer: Medicare Other | Admitting: Family Medicine

## 2016-01-08 ENCOUNTER — Telehealth: Payer: Self-pay | Admitting: *Deleted

## 2016-01-08 ENCOUNTER — Encounter: Payer: Self-pay | Admitting: Physician Assistant

## 2016-01-08 ENCOUNTER — Ambulatory Visit (INDEPENDENT_AMBULATORY_CARE_PROVIDER_SITE_OTHER): Payer: Medicare Other | Admitting: Physician Assistant

## 2016-01-08 VITALS — BP 100/70 | HR 96 | Ht 67.0 in | Wt 201.0 lb

## 2016-01-08 VITALS — BP 120/76 | HR 77 | Temp 98.7°F | Resp 17 | Ht 67.5 in | Wt 202.0 lb

## 2016-01-08 DIAGNOSIS — I5021 Acute systolic (congestive) heart failure: Secondary | ICD-10-CM

## 2016-01-08 DIAGNOSIS — I10 Essential (primary) hypertension: Secondary | ICD-10-CM | POA: Diagnosis not present

## 2016-01-08 DIAGNOSIS — I25810 Atherosclerosis of coronary artery bypass graft(s) without angina pectoris: Secondary | ICD-10-CM | POA: Diagnosis not present

## 2016-01-08 DIAGNOSIS — H9193 Unspecified hearing loss, bilateral: Secondary | ICD-10-CM | POA: Diagnosis not present

## 2016-01-08 DIAGNOSIS — I259 Chronic ischemic heart disease, unspecified: Secondary | ICD-10-CM

## 2016-01-08 DIAGNOSIS — I509 Heart failure, unspecified: Secondary | ICD-10-CM

## 2016-01-08 MED ORDER — FUROSEMIDE 40 MG PO TABS: 40.0000 mg | ORAL_TABLET | Freq: Two times a day (BID) | ORAL | Status: DC

## 2016-01-08 NOTE — Patient Instructions (Addendum)
IF you received an x-ray today, you will receive an invoice from [facility]. Please contact [facility] at [phone] with questions or concerns regarding your invoice.   IF you received labwork today, you will receive an invoice from [facility]. Please contact [facility] at [phone] with questions or concerns regarding your invoice.   Our billing staff will not be able to assist you with questions regarding bills from these companies.  You will be contacted with the lab results as soon as they are available. The fastest way to get your results is to activate your My Chart account. Instructions are located on the last page of this paperwork. If you have not heard from [place] regarding the results in 2 weeks, please contact this office.     Stop the lisinopril and the amlodipine. Likely you will be restarted on the lisinopril next week but perhaps stay off of the amlodipine.   Take the lasix and potassium twice a day - one dose in the morning and one with lunch. Do not eat salt. Try not to drink to many fluids - we want to get fluids out, not in, but also don't want you dehydrated. Put on knee high compression socks every morning and take off in the evening.   Try to keep walking short distances - exercise is good and will help you get off more of the extra fluid. Weight yourself daily to make sure it is still going down. Start the mucinex twice a day. Use the cough syrup before bed at night and redose when you wake up.  Keep on sleeping sitting up in the recliner for now.  Low-Sodium Eating Plan Sodium raises blood pressure and causes water to be held in the body. Getting less sodium from food will help lower your blood pressure, reduce any swelling, and protect your heart, liver, and kidneys. We get sodium by adding salt (sodium chloride) to food. Most of our sodium comes from canned, boxed, and frozen foods. Restaurant foods, fast foods, and  pizza are also very high in sodium. Even if you take medicine to lower your blood pressure or to reduce fluid in your body, getting less sodium from your food is important. WHAT IS MY PLAN? Most people should limit their sodium intake to 2,300 mg a day. Your health care provider recommends that you limit your sodium intake to __________ a day.  WHAT DO I NEED TO KNOW ABOUT THIS EATING PLAN? For the low-sodium eating plan, you will follow these general guidelines:  Choose foods with a % Daily Value for sodium of less than 5% (as listed on the food label).   Use salt-free seasonings or herbs instead of table salt or sea salt.   Check with your health care provider or pharmacist before using salt substitutes.   Eat fresh foods.  Eat more vegetables and fruits.  Limit canned vegetables. If you do use them, rinse them well to decrease the sodium.   Limit cheese to 1 oz (28 g) per day.   Eat lower-sodium products, often labeled as "lower sodium" or "no salt added."  Avoid foods that contain monosodium glutamate (MSG). MSG is sometimes added to [place] food and some canned foods.  Check food labels (Nutrition Facts labels) on foods to learn how much sodium is in one serving.  Eat more home-cooked food and less restaurant, buffet, and fast food.  When eating at a restaurant, ask that your food be prepared with less salt,  or no salt if possible.  HOW DO I READ FOOD LABELS FOR SODIUM INFORMATION? The Nutrition Facts label lists the amount of sodium in one serving of the food. If you eat more than one serving, you must multiply the listed amount of sodium by the number of servings. Food labels may also identify foods as:  Sodium free--Less than 5 mg in a serving.  Very low sodium--35 mg or less in a serving.  Low sodium--140 mg or less in a serving.  Light in sodium--50% less sodium in a serving. For example, if a food that usually has 300 mg of sodium is changed to become light  in sodium, it will have 150 mg of sodium.  Reduced sodium--25% less sodium in a serving. For example, if a food that usually has 400 mg of sodium is changed to reduced sodium, it will have 300 mg of sodium. WHAT FOODS CAN I EAT? Grains Low-sodium cereals, including oats, puffed wheat and rice, and shredded wheat cereals. Low-sodium crackers. Unsalted rice and pasta. Lower-sodium bread.  Vegetables Frozen or fresh vegetables. Low-sodium or reduced-sodium canned vegetables. Low-sodium or reduced-sodium tomato sauce and paste. Low-sodium or reduced-sodium tomato and vegetable juices.  Fruits Fresh, frozen, and canned fruit. Fruit juice.  Meat and Other Protein Products Low-sodium canned tuna and salmon. Fresh or frozen meat, poultry, seafood, and fish. Lamb. Unsalted nuts. Dried beans, peas, and lentils without added salt. Unsalted canned beans. Homemade soups without salt. Eggs.  Dairy Milk. Soy milk. Ricotta cheese. Low-sodium or reduced-sodium cheeses. Yogurt.  Condiments Fresh and dried herbs and spices. Salt-free seasonings. Onion and garlic powders. Low-sodium varieties of mustard and ketchup. Fresh or refrigerated horseradish. Lemon juice.  Fats and Oils Reduced-sodium salad dressings. Unsalted butter.  Other Unsalted popcorn and pretzels.  The items listed above may not be a complete list of recommended foods or beverages. Contact your dietitian for more options. WHAT FOODS ARE NOT RECOMMENDED? Grains Instant hot cereals. Bread stuffing, pancake, and biscuit mixes. Croutons. Seasoned rice or pasta mixes. Noodle soup cups. Boxed or frozen macaroni and cheese. Self-rising flour. Regular salted crackers. Vegetables Regular canned vegetables. Regular canned tomato sauce and paste. Regular tomato and vegetable juices. Frozen vegetables in sauces. Salted [place] fries. Olives. Rosita FirePickles. Relishes. Sauerkraut. Salsa. Meat and Other Protein Products Salted, canned, smoked, spiced,  or pickled meats, seafood, or fish. Bacon, ham, sausage, hot dogs, corned beef, chipped beef, and packaged luncheon meats. Salt pork. Jerky. Pickled herring. Anchovies, regular canned tuna, and sardines. Salted nuts. Dairy Processed cheese and cheese spreads. Cheese curds. Blue cheese and cottage cheese. Buttermilk.  Condiments Onion and garlic salt, seasoned salt, table salt, and sea salt. Canned and packaged gravies. Worcestershire sauce. Tartar sauce. Barbecue sauce. Teriyaki sauce. Soy sauce, including reduced sodium. Steak sauce. Fish sauce. Oyster sauce. Cocktail sauce. Horseradish that you find on the shelf. Regular ketchup and mustard. Meat flavorings and tenderizers. Bouillon cubes. Hot sauce. Tabasco sauce. Marinades. Taco seasonings. Relishes. Fats and Oils Regular salad dressings. Salted butter. Margarine. Ghee. Bacon fat.  Other Potato and tortilla chips. Corn chips and puffs. Salted popcorn and pretzels. Canned or dried soups. Pizza. Frozen entrees and pot pies.  The items listed above may not be a complete list of foods and beverages to avoid. Contact your dietitian for more information.   This information is not intended to replace advice given to you by your health care provider. Make sure you discuss any questions you have with your health care provider.   Document  Released: 02/18/2002 Document Revised: 09/19/2014 Document Reviewed: 07/03/2013 Elsevier Interactive Patient Education [employer]

## 2016-01-08 NOTE — Progress Notes (Signed)
Cardiology Office Note   Date:  01/08/2016   ID:  Tristan SkainsFrank Venable, DOB 08/06/1941, MRN [id number]  PCP:  Martha ClanShaw, William, MD  Cardiologist:  Dr Rosemarie Beathrenshaw  Loucile Posner, PA-C   Chief Complaint  Patient presents with  . Follow-up    Abnormal EKG, shortness of breath, bloated and extended, swelling in legs and feet, cramping, off balance    History of Present Illness: Tristan Baker is a 75 y.o. male with a history of DM, HTN, HLD, CABG 1996, DES SVG-OM 2014, Sz, DVT, RBBB, CKD III, ICM w/ EF 45% by echo 2014. Last o.v. 2015  Tristan Baker presents for Evaluation of edema and increased shortness of breath  He has not been tracking his weight very often, but is aware that it has been up and down. He has developed lower extremity edema, timeframe unclear. He also describes a feeling of smothering related to abdominal fullness. He is not eating very much because he feels full after just a few bites.  He has not had chest pain.  His diet is extremely poor. He is large amounts of sweets and sugared drinks. He also has not been watching his salt and drinks Gatorade upon occasion.   He does not get lightheaded or have dizzy spells although his blood pressure is 100/70 in the office today.  His balance is poor but he is not having problems with falling. He feels weak at times and his activity level is poor.   Past Medical History  Diagnosis Date  . Hypertension     INTERMITTENT NONCOMPLIANCE  . Obesity   . OA (osteoarthritis)   . DVT (deep venous thrombosis) (HCC)   . MI, old 601994    POSTERIOR M.I.  . RBBB (right bundle branch block)   . Hyperlipidemia   . Anxiety   . Depression   . Renal insufficiency, mild   . Decreased hearing   . Coronary artery disease     a. 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 b. s/p DES of SVG-OM 12/11/12.  . Diabetes mellitus     type 2   . LV dysfunction     EF 45% by echo 12/11/12  . HOH (hard of hearing)     Past Surgical History  Procedure  Laterality Date  . Cardiac catheterization  01/25/1995    NORMAL  . Coronary artery bypass graft  01/25/1995  . Knee surgery    . Cardiovascular stress test  06/16/2009    EF 43%, INFERIOR LATERAL HYPOKINESIA CONSISTENT WITH PREVIOUS INFARCTION, UNCHANGED FROM 2008. NO EVIDENCE OF ISCHEMIA  . Rotator cuff repair      multiple  . Elbow surgery    . Left heart catheterization with coronary/graft angiogram N/A 12/11/2012    Procedure: LEFT HEART CATHETERIZATION WITH Isabel CapriceORONARY/GRAFT ANGIOGRAM;  Surgeon: Peter M SwazilandJordan, MD;  [facility];  Service: Cardiovascular;  Laterality: N/A;    Current Outpatient Prescriptions  Medication Sig Dispense Refill  . amLODipine (NORVASC) 5 MG tablet Take 5 mg by mouth daily.      Marland Kitchen. aspirin 81 MG tablet Take 1 tablet (81 mg total) by mouth daily.    Marland Kitchen. atorvastatin (LIPITOR) 40 MG tablet Take 1 tablet (40 mg total) by mouth daily. 30 tablet 0  . azithromycin (ZITHROMAX) 500 MG tablet Take 1 tablet (500 mg total) by mouth daily. 3 tablet 0  . furosemide (LASIX) 40 MG tablet Take 1 tablet (40 mg total) by mouth daily. 30 tablet 0  .  gabapentin (NEURONTIN) 300 MG capsule Take 1 capsule (300 mg total) by mouth at bedtime.    Marland Kitchen HYDROcodone-homatropine (HYCODAN) 5-1.5 MG/5ML syrup Take 5 mLs by mouth every 8 (eight) hours as needed for cough. 120 mL 0  . Insulin Glargine (TOUJEO SOLOSTAR [place]) Inject 40 Units into the skin daily.    Marland Kitchen lisinopril (PRINIVIL,ZESTRIL) 10 MG tablet Take 10 mg by mouth daily.    . metoprolol succinate (TOPROL XL) 25 MG 24 hr tablet Take 1 tablet (25 mg total) by mouth daily. NEEDS TO MAKE APPT FOR FURTHER REFILLS 30 tablet 1  . Multiple Vitamin (MULTIVITAMIN) tablet Take 1 tablet by mouth daily.      . nitroGLYCERIN (NITROSTAT) 0.4 MG SL tablet Place 1 tablet (0.4 mg total) under the tongue every 5 (five) minutes as needed for chest pain (up to 3 doses).    . potassium chloride SA (K-DUR,KLOR-CON) 20 MEQ tablet Take 1 tablet (20 mEq  total) by mouth daily. Along with the furosemide (lasix) 30 tablet 0  . sertraline (ZOLOFT) 100 MG tablet Take 100 mg by mouth daily.      . traMADol (ULTRAM) 50 MG tablet Take 50 mg by mouth every 8 (eight) hours as needed. for pain  1  . zonisamide (ZONEGRAN) 100 MG capsule TAKE 2 CAPSULES BY MOUTH AT BEDTIME 30 capsule 0   No current facility-administered medications for this visit.    Allergies:   Choline fenofibrate    Social History:  The patient  reports that he has never smoked. He has never used smokeless tobacco. He reports that he does not drink alcohol or use illicit drugs.   Family History:  The patient's family history includes Heart disease in his father; Stroke in his mother.    ROS:  Please see the history of present illness. All other systems are reviewed and negative.    PHYSICAL EXAM: VS:  BP 100/70 mmHg  Pulse 96  Ht  (1.702 m)  Wt 201 lb (91.173 kg)  BMI 31.47 kg/m2 , BMI Body mass index is 31.47 kg/(m^2). GEN: Well nourished, well developed, male in no acute distress HEENT: normal for age  Neck: JVD 10 cm, no carotid bruit, no masses Cardiac: RRR; no murmur, no rubs, or gallops Respiratory: bibasilar rales, normal work of breathing GI: soft, nontender, nondistended, + BS; protuberant MS: no deformity or atrophy; 2+ edema; distal pulses are 2+ in all upper extremities, decreased in lower extremities due to edema  Skin: warm and dry, no rash Neuro:  Strength and sensation are intact Psych: euthymic mood, full affect   EKG:  EKG is not ordered today.  Recent Labs: 01/05/2016: BUN 23; Creat 1.91*; Hemoglobin 13.6*; Potassium 5.2; Sodium 141    Lipid Panel    Component Value Date/Time   CHOL 130 03/28/2014 1034   TRIG 400* 03/28/2014 1034   HDL 28* 03/28/2014 1034   CHOLHDL 4.6 03/28/2014 1034   VLDL 80* 03/28/2014 1034   LDLCALC 22 03/28/2014 1034   LDLDIRECT 58.5 12/04/2012 1043     Wt Readings from Last 3 Encounters:  01/08/16 201 lb  (91.173 kg)  01/05/16 206 lb (93.441 kg)  12/28/15 201 lb 9.6 oz (91.445 kg)     Other studies Reviewed: Additional studies/ records that were reviewed today include: Office notes and other records.  ASSESSMENT AND PLAN:  1.  Acute systolic CHF: He went to an urgent care and was started on Lasix at 40 mg a day and potassium 20  mEq daily.  However, he still has significant volume overload. He has significant dietary indiscretions. The importance of limiting sodium and monitoring fluid intake was discussed with the patient and his wife. He was strongly encouraged not to drink Gatorade. We will check a BMET today and a BMET, echocardiogram and office visit next week.  He was advised that he will have to be monitored closely because of his volume overload gets any worse, he will have to be hospitalized.  2. Diabetes: The importance of limiting sugars was reviewed.  3. Chronic kidney disease: His creatinine was 1.91 on 04/25. We will check it today and next week.  Addendum: After the patient had left the office, I reviewed the records further. Because of his low blood pressure and poor renal function, we will hold the lisinopril and the Norvasc for now. These medication changes were communicated with the patient by phone.  Current medicines are reviewed at length with the patient today.  The patient does not have concerns regarding medicines.  The following changes have been made:  Increase Lasix, hold lisinopril and Norvasc  Labs/ tests ordered today include:   Orders Placed This Encounter  Procedures  . Basic metabolic panel  . Brain natriuretic peptide  . ECHOCARDIOGRAM COMPLETE     Disposition:   FU with Cardiology next week  Signed, Leanna Battles  01/08/2016 2:41 PM    [facility] Phone: [phone]; Fax: [phone]  This note was written with the assistance of speech recognition software. Please excuse any transcriptional  errors.

## 2016-01-08 NOTE — Patient Instructions (Addendum)
Medication Instructions: Your physician has recommended you make the following change in your medication:  1- CHANGE furosemide to 40 mg (1 tablet) by mouth two times a day.    Labwork: Your physician recommends that you return for lab work in: TODAY. The lab can be found on the FIRST FLOOR of out building in [address]   Testing/Procedures: Your physician has requested that you have an echocardiogram. Echocardiography is a painless test that uses sound waves to create images of your heart. It provides your doctor with information about the size and shape of your heart and how well your heart's chambers and valves are working. This procedure takes approximately one hour. There are no restrictions for this procedure. SCHEDULE FOR END OF NEXT WEEK (WED/THUR OR FRI)    Follow-Up: Your physician recommends that you schedule a follow-up appointment in: 1 week with an extender on the day of the echo if possible.   Any Other Special Instructions Will Be Listed Below (If Applicable).     If you need a refill on your cardiac medications before your next appointment, please call your pharmacy.

## 2016-01-08 NOTE — Progress Notes (Signed)
Subjective:    Patient ID: Tristan Baker, male    DOB: 16-May-1941, 75 y.o.   MRN: [id number] Chief Complaint  Patient presents with  . heart issues   Chief Complaint  Patient presents with  . heart issues    HPI  Tristan Baker is here today for recheck on his chf exacerbation which I saw him for 3d prior. At that time, he was c/o URI sxs for several wks not improved by conservative management so I started him on azithro 529m qdx3d for poss pna seen on cxr. Started lasix 450mqd with K 2062mqd as well due to small pleural effusions, lower ext edema, and h/o CAD, EF 45% in 2014 but pt never kept cardiology f/u visit with repeat echo the following year.  Cbc and sed rate were relatively normal but bmp showed mildly worsening Cr with eGFR 34. bnp markedly elevated at  15x the ULN so pt was referred back to cardiology - he saw cards NP today who instructed him to double up on the lasix 40 w/ K 20 to bid while holding his lisinopril and amlodipine due to Cr bump.   Still has to rest after minimal activity - no improvement of DOE. Had to rest just in the middle of walking from his car to the cardiology office. Still sleeping restlessly and largely in his recliner as he feels like he is smothering when he lies flat- waking sev x/night - + fatigue, just does feel well.  He does not have a scale at home but is going to get one  Not keeping an eye on his blood sugars.     NO current complaints with leg cramping though distant hx.  DId have a h/o DVT in left leg  Past Medical History  Diagnosis Date  . Hypertension     INTERMITTENT NONCOMPLIANCE  . Obesity   . OA (osteoarthritis)   . [place] [address] POSTERIOR M.I.  . RBBB (right bundle branch block)   . Hyperlipidemia   . Anxiety   . Depression   . Renal insufficiency, mild   . Decreased hearing   . Coronary artery disease     a. 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 b. s/p DES of SVG-OM 12/11/12.  .  Diabetes mellitus     type 2   . LV dysfunction     EF 45% by echo 12/11/12  . HOH (hard of hearing)    Past Surgical History  Procedure Laterality Date  . Cardiac catheterization  01/25/1995    NORMAL  . Coronary artery bypass graft  01/25/1995  . Knee surgery    . Cardiovascular stress test  06/16/2009    EF 43%, INFERIOR LATERAL HYPOKINESIA CONSISTENT WITH PREVIOUS INFARCTION, UNCHANGED FROM 2008. NO EVIDENCE OF ISCHEMIA  . Rotator cuff repair      multiple  . Elbow surgery    . Left heart catheterization with coronary/graft angiogram N/A 12/11/2012    Procedure: LEFT HEART CATHETERIZATION WITH CORBeatrix FettersSurgeon: Peter M JorMartiniqueD;  [facility];  Service: Cardiovascular;  Laterality: N/A;   Current Outpatient Prescriptions on File Prior to Visit  Medication Sig Dispense Refill  . aspirin 81 MG tablet Take 1 tablet (81 mg total) by mouth daily.    . aMarland Kitchenorvastatin (LIPITOR) 40 MG tablet Take 1 tablet (40 mg total) by mouth daily. 30 tablet 0  . furosemide (LASIX)  40 MG tablet Take 1 tablet (40 mg total) by mouth 2 (two) times daily. 180 tablet 3  . gabapentin (NEURONTIN) 300 MG capsule Take 1 capsule (300 mg total) by mouth at bedtime.    Marland Kitchen HYDROcodone-homatropine (HYCODAN) 5-1.5 MG/5ML syrup Take 5 mLs by mouth every 8 (eight) hours as needed for cough. 120 mL 0  . Insulin Glargine (TOUJEO SOLOSTAR [place]) Inject 40 Units into the skin daily.    . metoprolol succinate (TOPROL XL) 25 MG 24 hr tablet Take 1 tablet (25 mg total) by mouth daily. NEEDS TO MAKE APPT FOR FURTHER REFILLS 30 tablet 1  . Multiple Vitamin (MULTIVITAMIN) tablet Take 1 tablet by mouth daily.      . nitroGLYCERIN (NITROSTAT) 0.4 MG SL tablet Place 1 tablet (0.4 mg total) under the tongue every 5 (five) minutes as needed for chest pain (up to 3 doses).    . potassium chloride SA (K-DUR,KLOR-CON) 20 MEQ tablet Take 1 tablet (20 mEq total) by mouth daily. Along with the furosemide (lasix) 30 tablet 0    . sertraline (ZOLOFT) 100 MG tablet Take 100 mg by mouth daily.      . traMADol (ULTRAM) 50 MG tablet Take 50 mg by mouth every 8 (eight) hours as needed. for pain  1   No current facility-administered medications on file prior to visit.   Allergies  Allergen Reactions  . Choline Fenofibrate     Trilpax   Family History  Problem Relation Age of Onset  . Stroke Mother   . Heart disease Father    Social History   Social History  . Marital Status: Married    Spouse Name: Levander Campion  . Number of Children: 4  . Years of Education: college   Occupational History  .       [employer]   Social History Main Topics  . Smoking status: Never Smoker   . Smokeless tobacco: Never Used  . Alcohol Use: No  . Drug Use: No  . Sexual Activity: Not Currently   Other Topics Concern  . None   Social History Narrative   Patient lives at home with his spouse.   Caffeine Use: 2 cups daily; ice tea or soda daily   Depression screen [facility] 2/9 01/08/2016 01/05/2016 12/28/2015  Decreased Interest 0 0 0  Down, Depressed, Hopeless 0 0 0  PHQ - 2 Score 0 0 0      Review of Systems  Constitutional: Positive for diaphoresis, activity change, fatigue and unexpected weight change. Negative for fever and chills.  Respiratory: Positive for cough and shortness of breath. Negative for chest tightness and wheezing.   Cardiovascular: Positive for leg swelling. Negative for chest pain and palpitations.  Endocrine: Positive for polyuria. Negative for polydipsia and polyphagia.  Genitourinary: Positive for frequency. Negative for decreased urine volume and difficulty urinating.  Musculoskeletal: Positive for myalgias and joint swelling.  Skin: Negative for wound.  Psychiatric/Behavioral: Positive for sleep disturbance. Negative for confusion and dysphoric mood.       Objective:  BP 120/76 mmHg  Pulse 77  Temp(Src) 98.7 F (37.1 C) (Oral)  Resp 17  Ht 5' 7.5" (1.715 m)  Wt 202 lb (91.627 kg)  BMI  31.15 kg/m2  SpO2 94%  Physical Exam  Constitutional: He is oriented to person, place, and time. He appears well-developed and well-nourished. No distress.  HENT:  Head: Normocephalic and atraumatic.  Right Ear: Tympanic membrane, external ear and ear canal normal. Decreased hearing is noted.  Left Ear: Tympanic membrane, external ear and ear canal normal. Decreased hearing is noted.  Nose: Rhinorrhea present. No mucosal edema.  Mouth/Throat: Uvula is midline and mucous membranes are normal.  +PND  Eyes: Conjunctivae are normal. Pupils are equal, round, and reactive to light. No scleral icterus.  Neck: Normal range of motion. Neck supple. No thyromegaly present.  Cardiovascular: Normal rate, regular rhythm and normal heart sounds.   2+ pitting edema to upper calf - improved from prior was 3+  Pulmonary/Chest: Effort normal. No respiratory distress. He has rales (bibasilar fine insp rales with decreased breath sounds).  Musculoskeletal: He exhibits edema.  Lymphadenopathy:    He has no cervical adenopathy.  Neurological: He is alert and oriented to person, place, and time.  Skin: Skin is warm and dry. He is not diaphoretic.  Psychiatric: He has a normal mood and affect. His behavior is normal.      Assessment & Plan:   1. Acute on chronic congestive heart failure, unspecified congestive heart failure type ([place])   2. Hearing loss, bilateral    Pt has cardiology recheck in 1 wk.  Has been instructed to increase lasix 40 w/ K 20 to bid while holding amlodipine and lisinopril due to Cr bump.  Will likely want pt stay off amlodipine but resume lisinopril when acute flair resolves.    Start daily weights.  Start compression socks, keep to low sodium diet without to many liquids   Delman Cheadle, MD MPH

## 2016-01-08 NOTE — Telephone Encounter (Signed)
Janell Quiethonda Barrett,PA decided after pt had gone home from his appt to have the pt discontinue his lisinopril and Norvasc. Attempted to reach patient. Message left on phone number, ok per DPR, with detailed instructions for stopping the two medications. Encouraged pt to call back if he had any questions about these instructions.

## 2016-01-08 NOTE — Telephone Encounter (Signed)
Attempted to reach pt again on all numbers listed on demographics. No answer at this time.

## 2016-01-09 LAB — BASIC METABOLIC PANEL
BUN: 22 mg/dL (ref 7–25)
CALCIUM: 9.4 mg/dL (ref 8.6–10.3)
CHLORIDE: 97 mmol/L — AB (ref 98–110)
CO2: 30 mmol/L (ref 20–31)
CREATININE: 1.93 mg/dL — AB (ref 0.70–1.18)
GLUCOSE: 164 mg/dL — AB (ref 65–99)
Potassium: 5 mmol/L (ref 3.5–5.3)
Sodium: 139 mmol/L (ref 135–146)

## 2016-01-09 LAB — BRAIN NATRIURETIC PEPTIDE: Brain Natriuretic Peptide: 1319.2 pg/mL — ABNORMAL HIGH (ref <100)

## 2016-01-12 ENCOUNTER — Telehealth: Payer: Self-pay | Admitting: Physician Assistant

## 2016-01-12 NOTE — Telephone Encounter (Signed)
Pt wife called because he has been urinating frequently on the Lasix and he has lost over 15 lbs. He is not light-headed or dizzy. He does not feel weak. His breathing is much better.   His wife is worried that he has lost too much weight and should be drinking Gatorade, and/or decreasing medications.   I advised her that Mr Tristan Baker's labs were OK at last check. I advised that he did not need Gatorade, his K+ was 5.0 and he is on potassium. I reiterated that Gatorade has too much salt in it and no heart failure patient should drink it.   I agreed that he could decreased the Lasix from bid to qd, and stop the potassium.  If he has symptoms, please call. Otherwise, keep Thursday appt and we will recheck his labs then. She was reassured and in agreement with the plan.  Leanna BattlesBarrett, Rhonda, PA-C 01/12/2016 6:29 PM Beeper [phone]

## 2016-01-13 ENCOUNTER — Other Ambulatory Visit (HOSPITAL_COMMUNITY): Payer: Medicare Other

## 2016-01-14 ENCOUNTER — Encounter: Payer: Self-pay | Admitting: Physician Assistant

## 2016-01-14 ENCOUNTER — Ambulatory Visit (INDEPENDENT_AMBULATORY_CARE_PROVIDER_SITE_OTHER): Payer: Medicare Other | Admitting: Physician Assistant

## 2016-01-14 ENCOUNTER — Ambulatory Visit (HOSPITAL_COMMUNITY)
Admission: RE | Admit: 2016-01-14 | Discharge: 2016-01-14 | Disposition: A | Discharge status: Home / Self Care | Payer: Medicare Other | Source: Ambulatory Visit | Attending: Cardiovascular Disease | Admitting: Cardiovascular Disease

## 2016-01-14 VITALS — BP 104/82 | HR 80 | Ht 67.5 in | Wt 188.0 lb

## 2016-01-14 DIAGNOSIS — I10 Essential (primary) hypertension: Secondary | ICD-10-CM

## 2016-01-14 DIAGNOSIS — I5021 Acute systolic (congestive) heart failure: Secondary | ICD-10-CM

## 2016-01-14 DIAGNOSIS — I259 Chronic ischemic heart disease, unspecified: Secondary | ICD-10-CM

## 2016-01-14 DIAGNOSIS — I25810 Atherosclerosis of coronary artery bypass graft(s) without angina pectoris: Secondary | ICD-10-CM

## 2016-01-14 DIAGNOSIS — Z79899 Other long term (current) drug therapy: Secondary | ICD-10-CM | POA: Diagnosis not present

## 2016-01-14 MED ORDER — POTASSIUM CHLORIDE CRYS ER 20 MEQ PO TBCR: 20.0000 meq | EXTENDED_RELEASE_TABLET | Freq: Every day | ORAL | Status: DC

## 2016-01-14 MED ORDER — FUROSEMIDE 40 MG PO TABS: 20.0000 mg | ORAL_TABLET | Freq: Every day | ORAL | Status: DC

## 2016-01-14 MED ORDER — POTASSIUM CHLORIDE CRYS ER 20 MEQ PO TBCR: 10.0000 meq | EXTENDED_RELEASE_TABLET | Freq: Every day | ORAL | Status: DC

## 2016-01-14 NOTE — Patient Instructions (Addendum)
Your physician recommends that you schedule a follow-up appointment in: 3 months with Dr Jens Somrenshaw.  Your physician has recommended you make the following change in your medication:   1.) the furosemide has been changed to 20 mg once daily.  2.) the potassium has been changed to 10 meq once daily.  Your physician recommends that you return for lab work TODAY.

## 2016-01-14 NOTE — Progress Notes (Signed)
Cardiology Office Note   Date:  01/14/2016   ID:  Tristan Baker, DOB 1941/09/10, MRN [id number]  PCP:  Martha Clan, MD  Cardiologist:  Dr Rosemarie Beath, PA-C   Chief Complaint  Patient presents with  . Follow-up    Weight loss    History of Present Illness: Tristan Baker is a 75 y.o. male with a history of DM, HTN, HLD, CABG 1996, DES SVG-OM 2014, Sz, DVT, RBBB, CKD III, ICM w/ EF 45% by echo 2014.   Seen 01/08/2016 with volume overload. His weight was up at least 20 pounds. Lifestyle modifications and medication changes were made for diuresis. Early follow-up was scheduled to reevaluate him.  His wife called on 05/02, and his Lasix dose was decreased from 40 mg twice a day down to 40 mg daily because of his dramatic weight loss.  Tristan Baker presents for follow-up on his heart failure.  Since last office visit, he has been more compliant with dietary changes. He has lost 14 pounds and is breathing much better. He has not been drinking Gatorade. He has not been drinking as many sugary drinks.  His lower extremity edema and other symptoms have greatly improved. He no longer has PND, but still has some orthopnea.   Past Medical History  Diagnosis Date  . Hypertension     INTERMITTENT NONCOMPLIANCE  . Obesity   . OA (osteoarthritis)   . DVT (deep venous thrombosis) (HCC)   . 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  . RBBB (right bundle branch block)   . Hyperlipidemia   . Anxiety   . Depression   . Renal insufficiency, mild   . Decreased hearing   . Coronary artery disease     a. 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 b. s/p DES of SVG-OM 12/11/12.  . Diabetes mellitus     type 2   . LV dysfunction     EF 45% by echo 12/11/12  . HOH (hard of hearing)     Past Surgical History  Procedure Laterality Date  . Cardiac catheterization  01/25/1995    NORMAL  . Coronary artery bypass graft  01/25/1995  . Knee surgery    . Cardiovascular stress test  06/16/2009   EF 43%, INFERIOR LATERAL HYPOKINESIA CONSISTENT WITH PREVIOUS INFARCTION, UNCHANGED FROM 2008. NO EVIDENCE OF ISCHEMIA  . Rotator cuff repair      multiple  . Elbow surgery    . Left heart catheterization with coronary/graft angiogram N/A 12/11/2012    Procedure: LEFT HEART CATHETERIZATION WITH Isabel Caprice;  Surgeon: Peter M Swaziland, MD;  Location: [facility] CATH LAB;  Service: Cardiovascular;  Laterality: N/A;    Current Outpatient Prescriptions  Medication Sig Dispense Refill  . aspirin 81 MG tablet Take 1 tablet (81 mg total) by mouth daily.    Marland Kitchen atorvastatin (LIPITOR) 40 MG tablet Take 1 tablet (40 mg total) by mouth daily. 30 tablet 0  . furosemide (LASIX) 40 MG tablet Take 1 tablet (40 mg total) by mouth 2 (two) times daily. (Patient taking differently: Take 40 mg by mouth daily. ) 180 tablet 3  . gabapentin (NEURONTIN) 300 MG capsule Take 1 capsule (300 mg total) by mouth at bedtime.    Marland Kitchen HYDROcodone-homatropine (HYCODAN) 5-1.5 MG/5ML syrup Take 5 mLs by mouth every 8 (eight) hours as needed for cough. 120 mL 0  . Insulin Glargine (TOUJEO SOLOSTAR [place]) Inject 40 Units into the skin daily.    Marland Kitchen  metoprolol succinate (TOPROL XL) 25 MG 24 hr tablet Take 1 tablet (25 mg total) by mouth daily. NEEDS TO MAKE APPT FOR FURTHER REFILLS 30 tablet 1  . Multiple Vitamin (MULTIVITAMIN) tablet Take 1 tablet by mouth daily.      . nitroGLYCERIN (NITROSTAT) 0.4 MG SL tablet Place 1 tablet (0.4 mg total) under the tongue every 5 (five) minutes as needed for chest pain (up to 3 doses).    . potassium chloride SA (K-DUR,KLOR-CON) 20 MEQ tablet Take 1 tablet (20 mEq total) by mouth daily. Along with the furosemide (lasix) 30 tablet 0  . sertraline (ZOLOFT) 100 MG tablet Take 100 mg by mouth daily.      . traMADol (ULTRAM) 50 MG tablet Take 50 mg by mouth every 8 (eight) hours as needed. for pain  1   No current facility-administered medications for this visit.    Allergies:   Choline fenofibrate     Social History:  The patient  reports that he has never smoked. He has never used smokeless tobacco. He reports that he does not drink alcohol or use illicit drugs.   Family History:  The patient's family history includes Heart disease in his father; Stroke in his mother.    ROS:  Please see the history of present illness. All other systems are reviewed and negative.    PHYSICAL EXAM: VS:  BP 104/82 mmHg  Pulse 80  Ht 5' 7.5" (1.715 m)  Wt 188 lb (85.276 kg)  BMI 28.99 kg/m2 , BMI Body mass index is 28.99 kg/(m^2). GEN: Well nourished, well developed, male in no acute distress HEENT: normal for age  Neck: Mild JVD, positive hepatojugular reflux, no carotid bruit, no masses Cardiac: RRR; soft murmur, no rubs, or gallops Respiratory: Few rales bases bilaterally, normal work of breathing GI: soft, nontender, nondistended, + BS MS: no deformity or atrophy; no edema; distal pulses are 2+ in all 4 extremities  Skin: warm and dry, no rash Neuro:  Strength and sensation are intact Psych: euthymic mood, full affect   EKG:  EKG is not ordered today.  Recent Labs: 01/05/2016: Hemoglobin 13.6* 01/08/2016: BUN 22; Creat 1.93*; Potassium 5.0; Sodium 139    Lipid Panel    Component Value Date/Time   CHOL 130 03/28/2014 1034   TRIG 400* 03/28/2014 1034   HDL 28* 03/28/2014 1034   CHOLHDL 4.6 03/28/2014 1034   VLDL 80* 03/28/2014 1034   LDLCALC 22 03/28/2014 1034   LDLDIRECT 58.5 12/04/2012 1043     Wt Readings from Last 3 Encounters:  01/14/16 188 lb (85.276 kg)  01/08/16 202 lb (91.627 kg)  01/08/16 201 lb (91.173 kg)     Other studies Reviewed: Additional studies/ records that were reviewed today include: Previous office notes and other.  ASSESSMENT AND PLAN:  1.  Acute systolic CHF: His echocardiogram is scheduled for Monday. His volume status is much improved. His weight is down 14 pounds. His blood pressure is low today, so we will decrease his Lasix down to 20 mg  daily and his potassium down to one half tablet daily. Complete the echocardiogram and continue other medications as scheduled.  2. Chronic kidney disease: His creatinine was 1.93 on 4/28. We will recheck it again today and adjust medications based on the results.  Current medicines are reviewed at length with the patient today.  The patient does not have concerns regarding medicines.  The following changes have been made:  Decrease Lasix, decrease potassium  Labs/ tests ordered today  include:   Orders Placed This Encounter  Procedures  . Basic metabolic panel     Disposition:   FU with Dr. Jens Somrenshaw  Signed, Theodore DemarkBarrett, Wednesday Ericsson, PA-C  01/14/2016 5:24 PM    [facility] Phone: [phone]; Fax: [phone]  This note was written with the assistance of speech recognition software. Please excuse any transcriptional errors.

## 2016-01-15 LAB — BASIC METABOLIC PANEL
BUN: 32 mg/dL — AB (ref 7–25)
CHLORIDE: 98 mmol/L (ref 98–110)
CO2: 29 mmol/L (ref 20–31)
CREATININE: 1.72 mg/dL — AB (ref 0.70–1.18)
Calcium: 9.7 mg/dL (ref 8.6–10.3)
GLUCOSE: 173 mg/dL — AB (ref 65–99)
POTASSIUM: 5.5 mmol/L — AB (ref 3.5–5.3)
Sodium: 138 mmol/L (ref 135–146)

## 2016-01-18 ENCOUNTER — Encounter (HOSPITAL_COMMUNITY): Payer: Self-pay | Admitting: Radiology

## 2016-01-18 ENCOUNTER — Encounter: Payer: Self-pay | Admitting: *Deleted

## 2016-01-18 ENCOUNTER — Ambulatory Visit (INDEPENDENT_AMBULATORY_CARE_PROVIDER_SITE_OTHER): Payer: Medicare Other

## 2016-01-18 ENCOUNTER — Ambulatory Visit (HOSPITAL_COMMUNITY): Payer: Medicare Other | Attending: Internal Medicine

## 2016-01-18 VITALS — HR 105 | Resp 20 | Ht 67.0 in | Wt 189.9 lb

## 2016-01-18 DIAGNOSIS — I509 Heart failure, unspecified: Secondary | ICD-10-CM | POA: Diagnosis not present

## 2016-01-18 DIAGNOSIS — I352 Nonrheumatic aortic (valve) stenosis with insufficiency: Secondary | ICD-10-CM | POA: Diagnosis not present

## 2016-01-18 DIAGNOSIS — I259 Chronic ischemic heart disease, unspecified: Secondary | ICD-10-CM

## 2016-01-18 DIAGNOSIS — I110 Hypertensive heart disease with heart failure: Secondary | ICD-10-CM | POA: Insufficient documentation

## 2016-01-18 DIAGNOSIS — E119 Type 2 diabetes mellitus without complications: Secondary | ICD-10-CM | POA: Insufficient documentation

## 2016-01-18 DIAGNOSIS — E785 Hyperlipidemia, unspecified: Secondary | ICD-10-CM | POA: Insufficient documentation

## 2016-01-18 DIAGNOSIS — I340 Nonrheumatic mitral (valve) insufficiency: Secondary | ICD-10-CM | POA: Diagnosis not present

## 2016-01-18 DIAGNOSIS — I255 Ischemic cardiomyopathy: Secondary | ICD-10-CM | POA: Insufficient documentation

## 2016-01-18 DIAGNOSIS — I2510 Atherosclerotic heart disease of native coronary artery without angina pectoris: Secondary | ICD-10-CM | POA: Insufficient documentation

## 2016-01-18 DIAGNOSIS — R9431 Abnormal electrocardiogram [ECG] [EKG]: Secondary | ICD-10-CM

## 2016-01-18 DIAGNOSIS — I25810 Atherosclerosis of coronary artery bypass graft(s) without angina pectoris: Secondary | ICD-10-CM

## 2016-01-18 DIAGNOSIS — I10 Essential (primary) hypertension: Secondary | ICD-10-CM | POA: Diagnosis not present

## 2016-01-18 NOTE — Telephone Encounter (Signed)
This encounter was created in error - please disregard.

## 2016-01-18 NOTE — Progress Notes (Signed)
Contacted Deliah Goodyebra Mathis, Dr Ludwig Clarksrenshaw's nurse, regarding reduction in EF and abnormal EKG during echo. Per Deliah Goodyebra Mathis, RN, an EKG was perfomed and reviewed by Dr. Elberta Fortisamnitz. Karsten FellsKaty Kemp, RN would notify Deliah Goodyebra Mathis.

## 2016-01-18 NOTE — Progress Notes (Signed)
Patient in today for ECHO. During examination, technician noticed abnormal EKG. EKG obtained and compared to last EKG on 4/25 and reviewed with Dr. Elberta Fortisamnitz.  No change from last EKG except more frequent PVCs.  Patient is completely asymptomatic - he has no complaints. Patient understands he will be called with further recommendations from Dr. Ludwig Clarksrenshaw's nurse.

## 2016-01-21 ENCOUNTER — Encounter: Payer: Self-pay | Admitting: *Deleted

## 2016-01-21 NOTE — Telephone Encounter (Addendum)
Unable to reach pt or leave a message mailbox is full 

## 2016-01-21 NOTE — Telephone Encounter (Signed)
-----   Message from Darrol Jumphonda G Barrett, PA-C sent at 01/21/2016  4:20 PM EDT ----- Please arrange Lexi MV to screen for ischemia and make sure his weight is not trending up again. Thanks

## 2016-01-25 ENCOUNTER — Telehealth: Payer: Self-pay | Admitting: Cardiology

## 2016-01-25 NOTE — Telephone Encounter (Signed)
Pt c/o Shortness Of Breath: STAT if SOB developed within the last 24 hours or pt is noticeably SOB on the phone   Pt wife requested RN to call pt to discuss symptoms- stated pt has been off Lasix for about a week. Please call back and discuss.    1. Are you currently SOB (can you hear that pt is SOB on the phone)?   2. How long have you been experiencing SOB? Few day s  3. Are you SOB when sitting or when up moving around? Worse after moving around  4. Are you currently experiencing any other symptoms? vomiting last night (5/14), chest pain  Pt c/o of Chest Pain: STAT if CP now or developed within 24 hours  1. Are you having CP right now? no  2. Are you experiencing any other symptoms (ex. SOB, nausea, vomiting, sweating)? SOB, vomiting   3. How long have you been experiencing CP? Since llast night   4. Is your CP continuous or coming and going? Comes/goes  5. Have you taken Nitroglycerin? No, but has taken baby aspirin ?

## 2016-01-25 NOTE — Telephone Encounter (Signed)
No answer. Left message to call back.   

## 2016-01-27 NOTE — Telephone Encounter (Signed)
Spoke with pt, he cont to have SOB and a little swelling in his feet and ankles. He has a follow up appt with his medical doctor tomorrow. Follow up scheduled with the PA scott weaver next week.

## 2016-01-29 ENCOUNTER — Telehealth (HOSPITAL_COMMUNITY): Payer: Self-pay | Admitting: Vascular Surgery

## 2016-01-29 NOTE — Telephone Encounter (Signed)
Pt was not home will call back on Monday

## 2016-02-03 NOTE — Progress Notes (Signed)
Cardiology Office Note:    Date:  02/04/2016   ID:  Tristan Baker, DOB May 09, 1941, MRN [id number]  PCP:  Martha Clan, MD  Cardiologist:  Dr. Olga Millers   Electrophysiologist:  N/a Nephrologist: Dr. Marisue Humble   Referring MD: Martha Clan, MD   Chief Complaint  Patient presents with  . Shortness of Breath  . Leg Swelling    History of Present Illness:     Tristan Baker is a 75 y.o. male with a hx of CAD s/p CABG in 1996 and subsequent PCI with DES to S-OM in 2014, ICM with prior EF 45% by echo in 2014, RBBB, DM, HTN, HL, prior DVT, CKD 3.  Last seen by Dr. Olga Millers in 2015.  He was seen back by Theodore Demark, PA-C 01/08/16.  He was volume overloaded and was started on Lasix for a/c systolic HF.  Lisinopril and Amlodipine were held due to low BP.  Last seen by [facility] 01/14/16. Weight had decreased by 14 pounds. Volume was improved. Follow-up echocardiogram demonstrated worsening LV function with EF 25-30%. Patient was to be set up for nuclear stress test. This has not yet been done.   Patient was seen by primary care 01/29/16. He had worsening volume excess. Chest x-ray reportedly showed pulmonary edema with small bilateral pleural effusions. Dr. Clelia Croft increased his Lasix. Labs 01/28/16: BUN 48, creatinine 2.4, potassium 4.7.  Returns for continued evaluation of CHF.  He has not been doing well for about 3 mos.  He notes increased dyspnea with exertion. He is NYHA 3. He notes orthopnea but denies PND. LE edema is persistent. When he saw his PCP last week, his weight did come down 5 pounds. It is back up 6 pounds over the past few days. He denies syncope. He does have chest discomfort with lying flat. He denies exertional chest discomfort. His abdomen is distended. He denies any bleeding issues. He does note cough with clear sputum production. Denies significant fever.   Past Medical History  Diagnosis Date  . Hypertension     INTERMITTENT NONCOMPLIANCE  . Obesity   . OA  (osteoarthritis)   . DVT (deep venous thrombosis) (HCC)   . 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  . RBBB (right bundle branch block)   . Hyperlipidemia   . Anxiety   . Depression   . Renal insufficiency, mild   . Decreased hearing   . Coronary artery disease     a. 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 b. s/p DES of SVG-OM 12/11/12.  . Diabetes mellitus     type 2   . LV dysfunction     EF 45% by echo 12/11/12  . HOH (hard of hearing)     Past Surgical History  Procedure Laterality Date  . Cardiac catheterization  01/25/1995    NORMAL  . Coronary artery bypass graft  01/25/1995  . Knee surgery    . Cardiovascular stress test  06/16/2009    EF 43%, INFERIOR LATERAL HYPOKINESIA CONSISTENT WITH PREVIOUS INFARCTION, UNCHANGED FROM 2008. NO EVIDENCE OF ISCHEMIA  . Rotator cuff repair      multiple  . Elbow surgery    . Left heart catheterization with coronary/graft angiogram N/A 12/11/2012    Procedure: LEFT HEART CATHETERIZATION WITH Isabel Caprice;  Surgeon: Peter M Swaziland, MD;  Location: [facility] CATH LAB;  Service: Cardiovascular;  Laterality: N/A;    Current Medications: Outpatient Prescriptions Prior to Visit  Medication Sig Dispense Refill  . aspirin  81 MG tablet Take 1 tablet (81 mg total) by mouth daily.    Marland Kitchen. atorvastatin (LIPITOR) 40 MG tablet Take 1 tablet (40 mg total) by mouth daily. 30 tablet 0  . gabapentin (NEURONTIN) 300 MG capsule Take 1 capsule (300 mg total) by mouth at bedtime.    Marland Kitchen. HYDROcodone-homatropine (HYCODAN) 5-1.5 MG/5ML syrup Take 5 mLs by mouth every 8 (eight) hours as needed for cough. 120 mL 0  . Insulin Glargine (TOUJEO SOLOSTAR [place]) Inject 40 Units into the skin daily.    . metoprolol succinate (TOPROL XL) 25 MG 24 hr tablet Take 1 tablet (25 mg total) by mouth daily. NEEDS TO MAKE APPT FOR FURTHER REFILLS 30 tablet 1  . Multiple Vitamin (MULTIVITAMIN) tablet Take 1 tablet by mouth daily.      . nitroGLYCERIN (NITROSTAT) 0.4 MG SL tablet Place 1 tablet  (0.4 mg total) under the tongue every 5 (five) minutes as needed for chest pain (up to 3 doses).    . sertraline (ZOLOFT) 100 MG tablet Take 100 mg by mouth daily.      . traMADol (ULTRAM) 50 MG tablet Take 50 mg by mouth every 8 (eight) hours as needed. for pain  1  . potassium chloride SA (K-DUR,KLOR-CON) 20 MEQ tablet Take 1 tablet (20 mEq total) by mouth daily. Along with the furosemide (lasix) 30 tablet 0  . potassium chloride SA (K-DUR,KLOR-CON) 20 MEQ tablet Take 0.5 tablets (10 mEq total) by mouth daily. Along with the furosemide (lasix) 30 tablet 0  . furosemide (LASIX) 40 MG tablet Take 0.5 tablets (20 mg total) by mouth daily. (Patient not taking: Reported on 02/04/2016) 60 tablet 3   No facility-administered medications prior to visit.      Allergies:   Choline fenofibrate   Social History   Social History  . Marital Status: Married    Spouse Name: [name]  . Number of Children: 4  . Years of Education: college   Occupational History  .       [employer]   Social History Main Topics  . Smoking status: Never Smoker   . Smokeless tobacco: Never Used  . Alcohol Use: No  . Drug Use: No  . Sexual Activity: Not Currently   Other Topics Concern  . None   Social History Narrative   Patient lives at home with his spouse.   Caffeine Use: 2 cups daily; ice tea or soda daily     Family History:  The patient's family history includes Heart disease in his father; Stroke in his mother.   ROS:   Please see the history of present illness.    Review of Systems  HENT: Positive for hearing loss.   Eyes: Positive for visual disturbance.  Cardiovascular: Positive for chest pain, dyspnea on exertion and leg swelling.  Respiratory: Positive for cough, shortness of breath, snoring and wheezing.   Musculoskeletal: Positive for joint swelling.   All other systems reviewed and are negative.   Physical Exam:    VS:  BP 120/80 mmHg  Pulse 94  Ht 5\' 7"  (1.702 m)  Wt 199 lb 12.8  oz (90.629 kg)  BMI 31.29 kg/m2   GEN: Well nourished, well developed, in no acute distress HEENT: normal Neck: JVP 10 cm, no masses Cardiac: Normal S1/S2, RRR; 1-2/6 systolic murmur LSB, 2+ LE edema    Respiratory:  Bibasilar rales, no wheezing GI:  distended MS: + presacral edema Skin: warm and dry Neuro: No focal deficits  Psych: Alert  and oriented x 3, normal affect  Wt Readings from Last 3 Encounters:  02/04/16 199 lb 12.8 oz (90.629 kg)  01/18/16 189 lb 14.4 oz (86.138 kg)  01/14/16 188 lb (85.276 kg)      Studies/Labs Reviewed:     EKG:  EKG is  ordered today.  The ekg ordered today demonstrates NSR, HR 94, right axis deviation, RBBB  Recent Labs: 01/05/2016: Hemoglobin 13.6* 01/08/2016: Brain Natriuretic Peptide 1319.2* 01/14/2016: BUN 32*; Creat 1.72*; Potassium 5.5*; Sodium 138   Recent Lipid Panel    Component Value Date/Time   CHOL 130 03/28/2014 1034   TRIG 400* 03/28/2014 1034   HDL 28* 03/28/2014 1034   CHOLHDL 4.6 03/28/2014 1034   VLDL 80* 03/28/2014 1034   LDLCALC 22 03/28/2014 1034   LDLDIRECT 58.5 12/04/2012 1043    Additional studies/ records that were reviewed today include:   Echo 01/18/16 EF 25-30%, mod AS (mean 8 mmHg - gradients low prob due to low EF), mild MR, mod to severe LAE, mild RVE, severe reduced RVSF, mild RAE, PASP 40 mmHg.  LHC 4/14 Left mainstem: The left main coronary is diffusely diseased up to 80-90%. Left anterior descending (LAD): Occluded after the second diagonal branch.  Left circumflex (LCx): The left circumflex is occluded proximally. Right coronary artery (RCA): The right coronary is occluded at its origin. S-OM1/OM2:  95% stenosis in the distal vein graft between the 2 marginal branches. RIMA-PDA.  widely patent. - 80-90% stenosis in the mid to dist PDA. PLBs diffusely diseased up to 50-70% proximally. L-LAD widely patent.   PCI Data: 3.5 x 12 mm Promus premier DES to SVG to OM1  Final Conclusions:  1. Severe  three-vessel and left main obstructive coronary disease. 2. Patent LIMA graft to the LAD 3. Patent RIMA graft to the PDA 4. Severe stenosis in the saphenous vein graft to the first and second obtuse marginal vessels between the 2 grafted vessels. 5. Successful stenting of the saphenous vein graft to the OM is using a drug-eluting stent. Recommendations: Continue dual antiplatelet therapy for at least one year. We will assess LV function cardiogram.  Myoview 10/10 Old inferolateral MI, EF 43%  ASSESSMENT:     1. Acute on chronic systolic CHF (congestive heart failure) (HCC)   2. Dilated cardiomyopathy (HCC)   3. Coronary artery disease involving coronary bypass graft of native heart without angina pectoris   4. Essential hypertension   5. CKD (chronic kidney disease) stage 3, GFR 30-59 ml/min   6. Hyperlipidemia     PLAN:     In order of problems listed above:  1. A/C Systolic CHF - Patient presents with NYHA 3-3b CHF.  He has evidence of RV failure as well on echo.  He is up 10 lbs by our scales.  He has had worsening renal function with diuresis resulting in reduced dose of diuretics.  His CHF has been difficult to manage.  His AS is worse and gradients are low due to low EF.  His EF is much worse than prior.  I think his best course of action is admission to the hospital for IV diuresis.  He will need to be followed by our HF Team.  He may even need inotropic support.  Once his volume is improved, will need to consider R/L heart cath if his renal function remains stable or improves. I reviewed this with Dr. Charlton Haws (DOD) who agreed.  I arranged for admission to [facility] today and spoke  with Dr. Marca Ancona who agreed to admit the patient.  However, the patient then decided that he did not want to go today and requested admission tomorrow.    -  Will arrange admission to [facility] tomorrow for CHF.  Dr. Shirlee Latch to see.  -  Will need to start IV diuresis upon arrival.  -  HF  Team to follow.  2. DCM - Likely ischemic CM.  His ACE inhibitor had previously been held due to low BP.  With worsening renal function, will have to follow creatinine closely and resume ACE inhibitor eventually.  Continue beta-blocker.    3. CAD - No angina.  Question if ischemia is cause of worsening LVEF.  Will need cath eventually.  Continue ASA, Lipitor, beta-blocker.  4. HTN - BP controlled.   5. CKD - Will follow renal function closely.  May need to get Neph to follow.   6. HL - Continue statin.    7. DM - Continue current regimen.  Will cover with SSI in the hospital.     Medication Adjustments/Labs and Tests Ordered: Current medicines are reviewed at length with the patient today.  Concerns regarding medicines are outlined above.  Medication changes, Labs and Tests ordered today are outlined in the Patient Instructions noted below. Patient Instructions  Medication Instructions:  PER SCOTT WEAVER, PAC TAKE LASIX 40 MG TODAY Labwork: NONE Testing/Procedures: NONE Any Other Special Instructions Will Be Listed Below (If Applicable). YOU ARE BEING ADMITTED TO  HOSPITAL 02/05/16; THE HOSPITAL WILL CALL YOU TOMORROW MORNING TO TELL WHAT TIME TO COME TO THE HOSPITAL .  If you need a refill on your cardiac medications before your next appointment, please call your pharmacy.    Signed, Tereso Newcomer, PA-C  02/04/2016 5:50 PM    [facility] [address] [place], [place], [place]  [address] Phone: [phone]; Fax: [phone]

## 2016-02-04 ENCOUNTER — Encounter: Payer: Self-pay | Admitting: Physician Assistant

## 2016-02-04 ENCOUNTER — Ambulatory Visit (INDEPENDENT_AMBULATORY_CARE_PROVIDER_SITE_OTHER): Payer: Medicare Other | Admitting: Physician Assistant

## 2016-02-04 VITALS — BP 120/80 | HR 94 | Ht 67.0 in | Wt 199.8 lb

## 2016-02-04 DIAGNOSIS — I420 Dilated cardiomyopathy: Secondary | ICD-10-CM

## 2016-02-04 DIAGNOSIS — N183 Chronic kidney disease, stage 3 unspecified: Secondary | ICD-10-CM

## 2016-02-04 DIAGNOSIS — I10 Essential (primary) hypertension: Secondary | ICD-10-CM | POA: Diagnosis not present

## 2016-02-04 DIAGNOSIS — E785 Hyperlipidemia, unspecified: Secondary | ICD-10-CM

## 2016-02-04 DIAGNOSIS — I5023 Acute on chronic systolic (congestive) heart failure: Secondary | ICD-10-CM | POA: Diagnosis not present

## 2016-02-04 DIAGNOSIS — I25810 Atherosclerosis of coronary artery bypass graft(s) without angina pectoris: Secondary | ICD-10-CM | POA: Diagnosis not present

## 2016-02-04 MED ORDER — FUROSEMIDE 40 MG PO TABS: 40.0000 mg | ORAL_TABLET | Freq: Every day | ORAL | Status: DC

## 2016-02-04 NOTE — Telephone Encounter (Signed)
This encounter was created in error - please disregard.

## 2016-02-04 NOTE — H&P (Addendum)
Admission History and Physical Note:    Date:  02/04/2016   ID:  Tristan Baker, DOB [birth year], MRN [id number]  PCP:  Martha Clan, MD  Cardiologist:  Dr. Olga Millers   Electrophysiologist:  N/a Nephrologist: Dr. Marisue Humble  HF: New  Referring MD: Martha Clan, MD   Chief Complaint  Patient presents with  . Shortness of Breath  . Leg Swelling    History of Present Illness:     Tristan Baker is a 75 y.o. male with a hx of CAD s/p CABG in 1996 and subsequent PCI with DES to S-OM in 2014, ICM with prior EF 45% by echo in 2014, RBBB, DM, HTN, HL, prior DVT, CKD 3.  Last seen by Dr. Olga Millers in 2015.  He was seen back by Theodore Demark, PA-C 01/08/16.  He was volume overloaded and was started on Lasix for a/c systolic HF.  Lisinopril and Amlodipine were held due to low BP.  Last seen by [facility] 01/14/16. Weight had decreased by 14 pounds. Volume was improved. Follow-up echocardiogram demonstrated worsening LV function with EF 25-30%. Patient was to be set up for nuclear stress test. This has not yet been done.   Patient was seen by primary care 01/29/16. He had worsening volume excess. Chest x-ray reportedly showed pulmonary edema with small bilateral pleural effusions. Dr. Clelia Croft increased his Lasix. Labs 01/28/16: BUN 48, creatinine 2.4, potassium 4.7.  Returned for continued evaluation of CHF on 02/04/16.    He has not been doing well for about 3 mos.  He noted increased dyspnea with exertion. He is NYHA 3. He notes orthopnea with chest discomfort but denies PND. LE edema is persistent. When he saw his PCP last week, his weight had come down 5 pounds. It is now back up at least 8 pounds over the past few days. (from 191 to 199) He denies syncope. He denies exertional chest discomfort. His abdomen is distended. He denies any bleeding issues. He does note cough with clear sputum production. Denies significant fever.  States he drinks > 2L at home.  Tries to watch salt, but travels a lot so  eats fast food often. Has had lasix adjusted often    Past Medical History  Diagnosis Date  . Hypertension     INTERMITTENT NONCOMPLIANCE  . Obesity   . OA (osteoarthritis)   . DVT (deep venous thrombosis) (HCC)   . 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  . RBBB (right bundle branch block)   . Hyperlipidemia   . Anxiety   . Depression   . Renal insufficiency, mild   . Decreased hearing   . Coronary artery disease     a. 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 b. s/p DES of SVG-OM 12/11/12.  . Diabetes mellitus     type 2   . LV dysfunction     EF 45% by echo 12/11/12  . HOH (hard of hearing)     Past Surgical History  Procedure Laterality Date  . Cardiac catheterization  01/25/1995    NORMAL  . Coronary artery bypass graft  01/25/1995  . Knee surgery    . Cardiovascular stress test  06/16/2009    EF 43%, INFERIOR LATERAL HYPOKINESIA CONSISTENT WITH PREVIOUS INFARCTION, UNCHANGED FROM 2008. NO EVIDENCE OF ISCHEMIA  . Rotator cuff repair      multiple  . Elbow surgery    . Left heart catheterization with coronary/graft angiogram N/A 12/11/2012    Procedure: LEFT HEART CATHETERIZATION  WITH Isabel Caprice;  Surgeon: Peter M Swaziland, MD;  Location: [facility] CATH LAB;  Service: Cardiovascular;  Laterality: N/A;    Current Medications: Outpatient Prescriptions Prior to Visit  Medication Sig Dispense Refill  . aspirin 81 MG tablet Take 1 tablet (81 mg total) by mouth daily.    Marland Kitchen atorvastatin (LIPITOR) 40 MG tablet Take 1 tablet (40 mg total) by mouth daily. 30 tablet 0  . gabapentin (NEURONTIN) 300 MG capsule Take 1 capsule (300 mg total) by mouth at bedtime.    Marland Kitchen HYDROcodone-homatropine (HYCODAN) 5-1.5 MG/5ML syrup Take 5 mLs by mouth every 8 (eight) hours as needed for cough. 120 mL 0  . Insulin Glargine (TOUJEO SOLOSTAR [place]) Inject 40 Units into the skin daily.    . metoprolol succinate (TOPROL XL) 25 MG 24 hr tablet Take 1 tablet (25 mg total) by mouth daily. NEEDS TO MAKE APPT FOR  FURTHER REFILLS 30 tablet 1  . Multiple Vitamin (MULTIVITAMIN) tablet Take 1 tablet by mouth daily.      . nitroGLYCERIN (NITROSTAT) 0.4 MG SL tablet Place 1 tablet (0.4 mg total) under the tongue every 5 (five) minutes as needed for chest pain (up to 3 doses).    . sertraline (ZOLOFT) 100 MG tablet Take 100 mg by mouth daily.      . traMADol (ULTRAM) 50 MG tablet Take 50 mg by mouth every 8 (eight) hours as needed. for pain  1  . potassium chloride SA (K-DUR,KLOR-CON) 20 MEQ tablet Take 1 tablet (20 mEq total) by mouth daily. Along with the furosemide (lasix) 30 tablet 0  . potassium chloride SA (K-DUR,KLOR-CON) 20 MEQ tablet Take 0.5 tablets (10 mEq total) by mouth daily. Along with the furosemide (lasix) 30 tablet 0  . furosemide (LASIX) 40 MG tablet Take 0.5 tablets (20 mg total) by mouth daily. (Patient not taking: Reported on 02/04/2016) 60 tablet 3   No facility-administered medications prior to visit.      Allergies:   Choline fenofibrate   Social History   Social History  . Marital Status: Married    Spouse Name: Graciella Belton  . Number of Children: 4  . Years of Education: college   Occupational History  .       [employer]   Social History Main Topics  . Smoking status: Never Smoker   . Smokeless tobacco: Never Used  . Alcohol Use: No  . Drug Use: No  . Sexual Activity: Not Currently   Other Topics Concern  . None   Social History Narrative   Patient lives at home with his spouse.   Caffeine Use: 2 cups daily; ice tea or soda daily     Family History:  The patient's family history includes Heart disease in his father; Stroke in his mother.   ROS:   Please see the history of present illness.    Review of Systems  HENT: Positive for hearing loss.  JVP 10-12+ Eyes: Positive for visual disturbance.  Cardiovascular: Positive for chest pressure, dyspnea on exertion and leg swelling. 2+ edema to knees with trace-1+ edema in thighs. Respiratory: Positive for cough,  shortness of breath, snoring and wheezing.   Musculoskeletal: Positive for joint swelling.   All other systems reviewed and are negative.   Physical Exam:    VS:  BP 120/80 mmHg  Pulse 94  Ht 5\' 7"  (1.702 m)  Wt 199 lb 12.8 oz (90.629 kg)  BMI 31.29 kg/m2   GEN: Well nourished, well developed, in no acute  distress HEENT: normal Neck: JVP 16 cm, no masses Cardiac: Normal S1/S2, RRR; somewhat distant heart sounds but possible S3.  2/6 systolic murmur RUSB. 2+ LE edema to knees bilaterally.  Respiratory:  Bibasilar rales, no wheezing GI:  distended MS: + presacral edema Skin: warm and dry Neuro: No focal deficits  Psych: Alert and oriented x 3, normal affect  Wt Readings from Last 3 Encounters:  02/04/16 199 lb 12.8 oz (90.629 kg)  01/18/16 189 lb 14.4 oz (86.138 kg)  01/14/16 188 lb (85.276 kg)      Studies/Labs Reviewed:     EKG:  EKG is ordered for today. EKG 02/04/16 demonstrated NSR, HR 94, right axis deviation, RBBB.    Recent Labs: 01/05/2016: Hemoglobin 13.6* 01/08/2016: Brain Natriuretic Peptide 1319.2* 01/14/2016: BUN 32*; Creat 1.72*; Potassium 5.5*; Sodium 138   Recent Lipid Panel    Component Value Date/Time   CHOL 130 03/28/2014 1034   TRIG 400* 03/28/2014 1034   HDL 28* 03/28/2014 1034   CHOLHDL 4.6 03/28/2014 1034   VLDL 80* 03/28/2014 1034   LDLCALC 22 03/28/2014 1034   LDLDIRECT 58.5 12/04/2012 1043    Additional studies/ records that were reviewed today include:   Echo 01/18/16 EF 25-30%, mod AS (mean 8 mmHg - gradients low prob due to low EF), mild MR, mod to severe LAE, mild RVE, severe reduced RVSF, mild RAE, PASP 40 mmHg.  LHC 4/14 Left mainstem: The left main coronary is diffusely diseased up to 80-90%. Left anterior descending (LAD): Occluded after the second diagonal branch.  Left circumflex (LCx): The left circumflex is occluded proximally. Right coronary artery (RCA): The right coronary is occluded at its origin. S-OM1/OM2:  95% stenosis  in the distal vein graft between the 2 marginal branches. RIMA-PDA.  widely patent. - 80-90% stenosis in the mid to dist PDA. PLBs diffusely diseased up to 50-70% proximally. L-LAD widely patent.   PCI Data: 3.5 x 12 mm Promus premier DES to SVG to OM1  Final Conclusions:  1. Severe three-vessel and left main obstructive coronary disease. 2. Patent LIMA graft to the LAD 3. Patent RIMA graft to the PDA 4. Severe stenosis in the saphenous vein graft to the first and second obtuse marginal vessels between the 2 grafted vessels. 5. Successful stenting of the saphenous vein graft to the OM is using a drug-eluting stent. Recommendations: Continue dual antiplatelet therapy for at least one year. We will assess LV function cardiogram.  Myoview 10/10 Old inferolateral MI, EF 43%  ASSESSMENT:     1. Acute on chronic systolic CHF (congestive heart failure) (HCC)   2. Dilated cardiomyopathy (HCC)   3. Coronary artery disease involving coronary bypass graft of native heart without angina pectoris   4. Essential hypertension   5. CKD (chronic kidney disease) stage 3, GFR 30-59 ml/min   6. Hyperlipidemia     PLAN:     Tristan Baker is a 75 y.o. male with a hx of CAD s/p CABG in 1996 and subsequent PCI with DES to S-OM in 2014, ICM with prior EF 45% by echo in 2014, RBBB, DM, HTN, HL, prior DVT, CKD 3 admitted for A/C systolic CHF.   1. A/C Systolic CHF - Patient presents with NYHA 3-3b CHF.   He has evidence of RV failure as well on echo.  He is up 10 lbs by our scales.  He has had worsening renal function with diuresis resulting in reduced dose of diuretics.  His CHF has been difficult to manage.  His AS is worse and gradients are low due to low EF.  His EF is much worse than prior.  I think his best course of action is admission to the hospital for IV diuresis.  He will need to be followed by our HF Team.  He may even need inotropic support.  Once his volume is improved, will need to consider R/L  heart cath if his renal function remains stable or improves. Tereso Newcomer, PA-C reviewed this with Dr. Charlton Haws (DOD) who agreed.  Arranged for admission to [facility] 02/04/16 and HF team agreed to admit patient.  However, the patient then decided that he would prefer to go home first, so admission arranged for today 02/05/16 - Admit to Tele for IV diuresis.  May need Stepdown if we decide to pursue PICC for CVP/Coox.  - Start with Lasix 80 mg IV BID and assess response. Determine K supp based on BMET on arrival.  - Continue Toprol XL 25 mg.  - No ACE/ARB currently with worsening renal function. 2. DCM - Likely ischemic CM.   - His ACE inhibitor had previously been held due to low BP.  With worsening renal function, will have to follow creatinine closely and resume ACE inhibitor eventually.  - Hold BB for now with some concern for low output.  3. CAD  - Question if ischemia is cause of worsening LVEF.  Will need cath eventually.   - Continue ASA and Lipitor 4. HTN  - Will manage in setting of treating #1 5. CKD  - BMET today. Follow closely with diuresis.  - If limiting factor, may need to involve renal.  6. HL  - Continue statin.   7. DM  - Continue current regimen.  Will cover with SSI in the hospital.    Graciella Freer, PA-C 02/05/2016   Advanced Heart Failure Team Pager [phone] (M-F; 7a - 4p)  Please contact [facility] for night-coverage after hours (4p -7a ) and weekends on [email or web address]  Patient seen with PA, agree with the above note.  He has had some dyspnea x 1 year, much worse over the last few months.  Up and down on Lasix dosing but steadily worsening symptoms.  Now short of breath walking 20-30 feet and prominent orthopnea (sleeps in recliner).  Has CKD, creatinine baseline around 1.7. No chest pain, no one event that he can remember that triggered the worsening of his symptoms.   1. Acute on chronic systolic CHF: Ischemic cardiomyopathy.  I reviewed  5/17 echo => EF 25% with moderately dilated LV, mildly dilated RV with moderate to severely decreased RV systolic function, heavily calcified aortic valve with mild to moderate stenosis.  This is considerably worse than his prior echo.  He has severe biventricular failure.  He is markedly volume overloaded on exam.  This is complicated by CKD stage 3, creatinine 1.7 (at baseline).  I am concerned about the possibility of low output heart failure and suspect that diuresis could be complicated by worsening of renal function.  - For now, start Lasix 80 mg IV bid.  - I will place a PICC line and monitor him in step-down unit => follow CVP and co-ox.  If co-ox is low, would add milrinone while we are diuresing.   - Hold Toprol XL for now given concern for low output (on pharmacy review, it looks like he has not been filling this at home).  - Tenuous prognosis, will know more after we see his trajectory  over the next day or two.  Suspect he will be in the hospital several days.  2. CAD: s/p CABG with RIMA-RCA, LIMA-LAD, and seq SVG-OM1/OM2.  He had DES to SVG-OM1/OM2 in 2014.  No chest pain.  I am concerned that the significant fall in EF on most recent echo may be related to progressive coronary disease.   - Continue ASA 81, statin.  - Needs left and right heart catheterization.  Would diurese first, possibly will do this next week but will depend upon renal function.  3. Aortic stenosis: I reviewed the most recent echo.  The aortic valve is heavily calcified but opens.  I suspect mild to possibly moderate aortic stenosis (opens reasonably in the setting of significantly decreased EF) => do not think severe.    Marca AnconaDalton Ricky Doan 02/05/2016 1:13 PM  Co-ox low at 47% and creatinine higher at 2.4.  Milrinone 0.25 mcg/kg/min begun.   Marca AnconaDalton Lillianna Sabel 02/05/2016

## 2016-02-04 NOTE — Patient Instructions (Addendum)
Medication Instructions:  PER SCOTT WEAVER, PAC TAKE LASIX 40 MG TODAY Labwork: NONE Testing/Procedures: NONE Any Other Special Instructions Will Be Listed Below (If Applicable). YOU ARE BEING ADMITTED TO [facility] 02/05/16; THE HOSPITAL WILL CALL YOU TOMORROW MORNING TO TELL WHAT TIME TO COME TO THE HOSPITAL .  If you need a refill on your cardiac medications before your next appointment, please call your pharmacy.

## 2016-02-05 ENCOUNTER — Encounter (HOSPITAL_COMMUNITY): Payer: Self-pay | Admitting: General Practice

## 2016-02-05 ENCOUNTER — Inpatient Hospital Stay (HOSPITAL_COMMUNITY)
Admission: RE | Admit: 2016-02-05 | Discharge: 2016-02-11 | DRG: 286 | Disposition: A | Discharge status: Skilled Nursing Facility | Payer: Medicare Other | Source: Ambulatory Visit | Attending: Cardiology | Admitting: Cardiology

## 2016-02-05 DIAGNOSIS — Z951 Presence of aortocoronary bypass graft: Secondary | ICD-10-CM | POA: Diagnosis not present

## 2016-02-05 DIAGNOSIS — I2510 Atherosclerotic heart disease of native coronary artery without angina pectoris: Secondary | ICD-10-CM | POA: Insufficient documentation

## 2016-02-05 DIAGNOSIS — I255 Ischemic cardiomyopathy: Secondary | ICD-10-CM | POA: Diagnosis present

## 2016-02-05 DIAGNOSIS — I472 Ventricular tachycardia: Secondary | ICD-10-CM | POA: Diagnosis not present

## 2016-02-05 DIAGNOSIS — N184 Chronic kidney disease, stage 4 (severe): Secondary | ICD-10-CM | POA: Diagnosis present

## 2016-02-05 DIAGNOSIS — F329 Major depressive disorder, single episode, unspecified: Secondary | ICD-10-CM | POA: Diagnosis present

## 2016-02-05 DIAGNOSIS — Z794 Long term (current) use of insulin: Secondary | ICD-10-CM

## 2016-02-05 DIAGNOSIS — Z86718 Personal history of other venous thrombosis and embolism: Secondary | ICD-10-CM

## 2016-02-05 DIAGNOSIS — I5023 Acute on chronic systolic (congestive) heart failure: Secondary | ICD-10-CM | POA: Diagnosis present

## 2016-02-05 DIAGNOSIS — Z9119 Patient's noncompliance with other medical treatment and regimen: Secondary | ICD-10-CM

## 2016-02-05 DIAGNOSIS — Z955 Presence of coronary angioplasty implant and graft: Secondary | ICD-10-CM

## 2016-02-05 DIAGNOSIS — I130 Hypertensive heart and chronic kidney disease with heart failure and stage 1 through stage 4 chronic kidney disease, or unspecified chronic kidney disease: Secondary | ICD-10-CM | POA: Diagnosis present

## 2016-02-05 DIAGNOSIS — I350 Nonrheumatic aortic (valve) stenosis: Secondary | ICD-10-CM | POA: Diagnosis present

## 2016-02-05 DIAGNOSIS — E785 Hyperlipidemia, unspecified: Secondary | ICD-10-CM | POA: Diagnosis present

## 2016-02-05 DIAGNOSIS — I4510 Unspecified right bundle-branch block: Secondary | ICD-10-CM | POA: Diagnosis present

## 2016-02-05 DIAGNOSIS — E1122 Type 2 diabetes mellitus with diabetic chronic kidney disease: Secondary | ICD-10-CM | POA: Diagnosis present

## 2016-02-05 DIAGNOSIS — N179 Acute kidney failure, unspecified: Secondary | ICD-10-CM

## 2016-02-05 DIAGNOSIS — I509 Heart failure, unspecified: Secondary | ICD-10-CM | POA: Diagnosis not present

## 2016-02-05 DIAGNOSIS — F419 Anxiety disorder, unspecified: Secondary | ICD-10-CM | POA: Diagnosis present

## 2016-02-05 DIAGNOSIS — Z79899 Other long term (current) drug therapy: Secondary | ICD-10-CM | POA: Diagnosis not present

## 2016-02-05 DIAGNOSIS — I252 Old myocardial infarction: Secondary | ICD-10-CM

## 2016-02-05 DIAGNOSIS — I493 Ventricular premature depolarization: Secondary | ICD-10-CM | POA: Diagnosis not present

## 2016-02-05 DIAGNOSIS — H9190 Unspecified hearing loss, unspecified ear: Secondary | ICD-10-CM | POA: Diagnosis present

## 2016-02-05 DIAGNOSIS — Z8249 Family history of ischemic heart disease and other diseases of the circulatory system: Secondary | ICD-10-CM

## 2016-02-05 DIAGNOSIS — E669 Obesity, unspecified: Secondary | ICD-10-CM | POA: Diagnosis present

## 2016-02-05 DIAGNOSIS — Z7982 Long term (current) use of aspirin: Secondary | ICD-10-CM

## 2016-02-05 DIAGNOSIS — N189 Chronic kidney disease, unspecified: Secondary | ICD-10-CM | POA: Diagnosis not present

## 2016-02-05 DIAGNOSIS — I5021 Acute systolic (congestive) heart failure: Secondary | ICD-10-CM

## 2016-02-05 HISTORY — DX: Heart failure, unspecified: I50.9

## 2016-02-05 HISTORY — DX: Reserved for inherently not codable concepts without codable children: IMO0001

## 2016-02-05 LAB — COMPREHENSIVE METABOLIC PANEL
ALBUMIN: 3.5 g/dL (ref 3.5–5.0)
ALT: 16 U/L — ABNORMAL LOW (ref 17–63)
ANION GAP: 7 (ref 5–15)
AST: 30 U/L (ref 15–41)
Alkaline Phosphatase: 61 U/L (ref 38–126)
BUN: 37 mg/dL — ABNORMAL HIGH (ref 6–20)
CHLORIDE: 104 mmol/L (ref 101–111)
CO2: 25 mmol/L (ref 22–32)
Calcium: 9 mg/dL (ref 8.9–10.3)
Creatinine, Ser: 2.4 mg/dL — ABNORMAL HIGH (ref 0.61–1.24)
GFR calc Af Amer: 29 mL/min — ABNORMAL LOW (ref >60)
GFR calc non Af Amer: 25 mL/min — ABNORMAL LOW (ref >60)
GLUCOSE: 182 mg/dL — AB (ref 65–99)
POTASSIUM: 4.7 mmol/L (ref 3.5–5.1)
SODIUM: 136 mmol/L (ref 135–145)
Total Bilirubin: 1 mg/dL (ref 0.3–1.2)
Total Protein: 6.8 g/dL (ref 6.5–8.1)

## 2016-02-05 LAB — GLUCOSE, CAPILLARY
GLUCOSE-CAPILLARY: 116 mg/dL — AB (ref 65–99)
GLUCOSE-CAPILLARY: 217 mg/dL — AB (ref 65–99)
Glucose-Capillary: 113 mg/dL — ABNORMAL HIGH (ref 65–99)

## 2016-02-05 LAB — CBC WITH DIFFERENTIAL/PLATELET
BASOS PCT: 0 %
Basophils Absolute: 0 10*3/uL (ref 0.0–0.1)
EOS ABS: 0.2 10*3/uL (ref 0.0–0.7)
Eosinophils Relative: 3 %
HCT: 39 % (ref 39.0–52.0)
HEMOGLOBIN: 12 g/dL — AB (ref 13.0–17.0)
Lymphocytes Relative: 20 %
Lymphs Abs: 1.1 10*3/uL (ref 0.7–4.0)
MCH: 29.6 pg (ref 26.0–34.0)
MCHC: 30.8 g/dL (ref 30.0–36.0)
MCV: 96.3 fL (ref 78.0–100.0)
MONO ABS: 0.4 10*3/uL (ref 0.1–1.0)
MONOS PCT: 8 %
NEUTROS PCT: 69 %
Neutro Abs: 4 10*3/uL (ref 1.7–7.7)
Platelets: 187 10*3/uL (ref 150–400)
RBC: 4.05 MIL/uL — ABNORMAL LOW (ref 4.22–5.81)
RDW: 14.5 % (ref 11.5–15.5)
WBC: 5.8 10*3/uL (ref 4.0–10.5)

## 2016-02-05 LAB — TROPONIN I
TROPONIN I: 0.13 ng/mL — AB (ref <0.031)
TROPONIN I: 0.14 ng/mL — AB (ref <0.031)

## 2016-02-05 LAB — CARBOXYHEMOGLOBIN
Carboxyhemoglobin: 0.8 % (ref 0.5–1.5)
Methemoglobin: 1.1 % (ref 0.0–1.5)
O2 Saturation: 43.8 %
TOTAL HEMOGLOBIN: 12.6 g/dL — AB (ref 13.5–18.0)

## 2016-02-05 LAB — BRAIN NATRIURETIC PEPTIDE: B Natriuretic Peptide: 2128.4 pg/mL — ABNORMAL HIGH (ref 0.0–100.0)

## 2016-02-05 LAB — TSH: TSH: 4.603 u[IU]/mL — ABNORMAL HIGH (ref 0.350–4.500)

## 2016-02-05 LAB — MAGNESIUM: Magnesium: 2 mg/dL (ref 1.7–2.4)

## 2016-02-05 LAB — MRSA PCR SCREENING: MRSA by PCR: NEGATIVE

## 2016-02-05 MED ORDER — ENOXAPARIN SODIUM 30 MG/0.3ML ~~LOC~~ SOLN
30.0000 mg | SUBCUTANEOUS | Status: DC
  Administered 2016-02-06 – 2016-02-09 (×4): 30 mg via SUBCUTANEOUS
  Filled 2016-02-05 (×4): qty 0.3

## 2016-02-05 MED ORDER — NITROGLYCERIN 0.4 MG SL SUBL
0.4000 mg | SUBLINGUAL_TABLET | SUBLINGUAL | Status: DC | PRN
Start: 2016-02-05 — End: 2016-02-11

## 2016-02-05 MED ORDER — GABAPENTIN 300 MG PO CAPS
300.0000 mg | ORAL_CAPSULE | Freq: Every day | ORAL | Status: DC
  Administered 2016-02-05 – 2016-02-10 (×6): 300 mg via ORAL
  Filled 2016-02-05 (×6): qty 1

## 2016-02-05 MED ORDER — FUROSEMIDE 10 MG/ML IJ SOLN
80.0000 mg | Freq: Two times a day (BID) | INTRAMUSCULAR | Status: DC
  Administered 2016-02-05 – 2016-02-09 (×8): 80 mg via INTRAVENOUS
  Filled 2016-02-05 (×8): qty 8

## 2016-02-05 MED ORDER — ENOXAPARIN SODIUM 40 MG/0.4ML ~~LOC~~ SOLN
40.0000 mg | SUBCUTANEOUS | Status: DC
  Administered 2016-02-05: 40 mg via SUBCUTANEOUS
  Filled 2016-02-05: qty 0.4

## 2016-02-05 MED ORDER — TRAMADOL HCL 50 MG PO TABS
50.0000 mg | ORAL_TABLET | Freq: Three times a day (TID) | ORAL | Status: DC | PRN
  Administered 2016-02-06 – 2016-02-10 (×4): 50 mg via ORAL
  Filled 2016-02-05 (×4): qty 1

## 2016-02-05 MED ORDER — SODIUM CHLORIDE 0.9 % IV SOLN: 250.0000 mL | INTRAVENOUS | Status: DC | PRN

## 2016-02-05 MED ORDER — ASPIRIN EC 81 MG PO TBEC
81.0000 mg | DELAYED_RELEASE_TABLET | Freq: Every day | ORAL | Status: DC
  Administered 2016-02-06 – 2016-02-11 (×7): 81 mg via ORAL
  Filled 2016-02-05 (×6): qty 1

## 2016-02-05 MED ORDER — SODIUM CHLORIDE 0.9% FLUSH: 3.0000 mL | INTRAVENOUS | Status: DC | PRN

## 2016-02-05 MED ORDER — SODIUM CHLORIDE 0.9% FLUSH: 10.0000 mL | INTRAVENOUS | Status: DC | PRN

## 2016-02-05 MED ORDER — ACETAMINOPHEN 325 MG PO TABS
650.0000 mg | ORAL_TABLET | ORAL | Status: DC | PRN
  Administered 2016-02-06: 650 mg via ORAL
  Filled 2016-02-05: qty 2

## 2016-02-05 MED ORDER — SODIUM CHLORIDE 0.9% FLUSH
3.0000 mL | Freq: Two times a day (BID) | INTRAVENOUS | Status: DC
  Administered 2016-02-05 – 2016-02-11 (×5): 3 mL via INTRAVENOUS

## 2016-02-05 MED ORDER — SERTRALINE HCL 100 MG PO TABS
100.0000 mg | ORAL_TABLET | Freq: Every day | ORAL | Status: DC
  Administered 2016-02-05 – 2016-02-10 (×6): 100 mg via ORAL
  Filled 2016-02-05 (×6): qty 1

## 2016-02-05 MED ORDER — ADULT MULTIVITAMIN W/MINERALS CH
1.0000 | ORAL_TABLET | Freq: Every day | ORAL | Status: DC
  Administered 2016-02-05 – 2016-02-06 (×2): 1 via ORAL
  Filled 2016-02-05 (×2): qty 1

## 2016-02-05 MED ORDER — ATORVASTATIN CALCIUM 40 MG PO TABS
40.0000 mg | ORAL_TABLET | Freq: Every day | ORAL | Status: DC
  Administered 2016-02-05 – 2016-02-10 (×6): 40 mg via ORAL
  Filled 2016-02-05 (×6): qty 1

## 2016-02-05 MED ORDER — MILRINONE LACTATE IN DEXTROSE 20-5 MG/100ML-% IV SOLN
0.2500 ug/kg/min | INTRAVENOUS | Status: DC
  Administered 2016-02-05 – 2016-02-07 (×4): 0.25 ug/kg/min via INTRAVENOUS
  Administered 2016-02-07 – 2016-02-09 (×6): 0.375 ug/kg/min via INTRAVENOUS
  Administered 2016-02-10: 0.25 ug/kg/min via INTRAVENOUS
  Administered 2016-02-10: 0.375 ug/kg/min via INTRAVENOUS
  Filled 2016-02-05 (×13): qty 100

## 2016-02-05 MED ORDER — INSULIN ASPART 100 UNIT/ML ~~LOC~~ SOLN
0.0000 [IU] | Freq: Every day | SUBCUTANEOUS | Status: DC
  Administered 2016-02-05 – 2016-02-08 (×3): 2 [IU] via SUBCUTANEOUS
  Administered 2016-02-09: 5 [IU] via SUBCUTANEOUS
  Administered 2016-02-10: 2 [IU] via SUBCUTANEOUS

## 2016-02-05 MED ORDER — ONDANSETRON HCL 4 MG/2ML IJ SOLN: 4.0000 mg | Freq: Four times a day (QID) | INTRAMUSCULAR | Status: DC | PRN

## 2016-02-05 MED ORDER — SODIUM CHLORIDE 0.9% FLUSH
10.0000 mL | Freq: Two times a day (BID) | INTRAVENOUS | Status: DC
  Administered 2016-02-05 – 2016-02-11 (×8): 10 mL

## 2016-02-05 MED ORDER — INSULIN ASPART 100 UNIT/ML ~~LOC~~ SOLN
0.0000 [IU] | Freq: Three times a day (TID) | SUBCUTANEOUS | Status: DC
  Administered 2016-02-06: 5 [IU] via SUBCUTANEOUS
  Administered 2016-02-06: 11 [IU] via SUBCUTANEOUS
  Administered 2016-02-07: 2 [IU] via SUBCUTANEOUS
  Administered 2016-02-07: 5 [IU] via SUBCUTANEOUS
  Administered 2016-02-08 (×2): 8 [IU] via SUBCUTANEOUS
  Administered 2016-02-08: 3 [IU] via SUBCUTANEOUS
  Administered 2016-02-09: 2 [IU] via SUBCUTANEOUS

## 2016-02-05 NOTE — Progress Notes (Signed)
Peripherally Inserted Central Catheter/Midline Placement  The IV Nurse has discussed with the patient and/or persons authorized to consent for the patient, the purpose of this procedure and the potential benefits and risks involved with this procedure.  The benefits include less needle sticks, lab draws from the catheter and patient may be discharged home with the catheter.  Risks include, but not limited to, infection, bleeding, blood clot (thrombus formation), and puncture of an artery; nerve damage and irregular heat beat.  Alternatives to this procedure were also discussed.  PICC/Midline Placement Documentation        Stacie GlazeJoyce, Brendaliz Kuk Horton 02/05/2016, 5:57 PM

## 2016-02-05 NOTE — Care Management Note (Signed)
Case Management Note  Patient Details  Name: Tristan SkainsFrank Serena MRN: [id number] Date of Birth: 08/26/1941  Subjective/Objective:       Adm w heart failure             Action/Plan: lives at home, pcp dr Clelia Croftshaw   Expected Discharge Date:                  Expected Discharge Plan:  Home w Home Health Services  In-House Referral:     Discharge planning Services  CM Consult  Post Acute Care Choice:    Choice offered to:     DME Arranged:    DME Agency:     HH Arranged:    HH Agency:     Status of Service:     Medicare Important Message Given:    Date Medicare IM Given:    Medicare IM give by:    Date Additional Medicare IM Given:    Additional Medicare Important Message give by:     If discussed at Long Length of Stay Meetings, dates discussed:    Additional Comments:will moniter for dc needs as pt progresses.  Hanley Haysowell, Eri Mcevers T, RN 02/05/2016, 10:48 AM

## 2016-02-05 NOTE — Progress Notes (Signed)
Three attempts to call report.

## 2016-02-05 NOTE — Progress Notes (Signed)
Patient has arrived to the unit as a direct admit; patient and vital signs are stable. Nurse will await orders and follow as noted.

## 2016-02-05 NOTE — Progress Notes (Signed)
[facility]  Patient and vital signs are stable and patient will now be transported to [facility].

## 2016-02-06 DIAGNOSIS — N184 Chronic kidney disease, stage 4 (severe): Secondary | ICD-10-CM

## 2016-02-06 LAB — TROPONIN I: Troponin I: 0.14 ng/mL — ABNORMAL HIGH (ref <0.031)

## 2016-02-06 LAB — BASIC METABOLIC PANEL
Anion gap: 10 (ref 5–15)
BUN: 40 mg/dL — AB (ref 6–20)
CALCIUM: 9.1 mg/dL (ref 8.9–10.3)
CO2: 26 mmol/L (ref 22–32)
CREATININE: 2.55 mg/dL — AB (ref 0.61–1.24)
Chloride: 101 mmol/L (ref 101–111)
GFR calc non Af Amer: 23 mL/min — ABNORMAL LOW (ref >60)
GFR, EST AFRICAN AMERICAN: 27 mL/min — AB (ref >60)
GLUCOSE: 174 mg/dL — AB (ref 65–99)
Potassium: 4.4 mmol/L (ref 3.5–5.1)
Sodium: 137 mmol/L (ref 135–145)

## 2016-02-06 LAB — GLUCOSE, CAPILLARY
GLUCOSE-CAPILLARY: 161 mg/dL — AB (ref 65–99)
GLUCOSE-CAPILLARY: 317 mg/dL — AB (ref 65–99)
GLUCOSE-CAPILLARY: 208 mg/dL — AB (ref 65–99)
GLUCOSE-CAPILLARY: 153 mg/dL — AB (ref 65–99)

## 2016-02-06 LAB — CARBOXYHEMOGLOBIN
Carboxyhemoglobin: 1.2 % (ref 0.5–1.5)
Methemoglobin: 1 % (ref 0.0–1.5)
O2 Saturation: 63.7 %
Total hemoglobin: 11.7 g/dL — ABNORMAL LOW (ref 13.5–18.0)

## 2016-02-06 LAB — HEMOGLOBIN A1C
Hgb A1c MFr Bld: 9.4 % — ABNORMAL HIGH (ref 4.8–5.6)
MEAN PLASMA GLUCOSE: 223 mg/dL

## 2016-02-06 MED ORDER — METOLAZONE 5 MG PO TABS
5.0000 mg | ORAL_TABLET | Freq: Every day | ORAL | Status: DC
  Administered 2016-02-06 – 2016-02-08 (×3): 5 mg via ORAL
  Filled 2016-02-06 (×4): qty 1

## 2016-02-06 MED ORDER — METOPROLOL TARTRATE 12.5 MG HALF TABLET
12.5000 mg | ORAL_TABLET | Freq: Two times a day (BID) | ORAL | Status: DC
  Administered 2016-02-06 – 2016-02-07 (×2): 12.5 mg via ORAL
  Filled 2016-02-06 (×2): qty 1

## 2016-02-06 MED ORDER — POTASSIUM CHLORIDE CRYS ER 10 MEQ PO TBCR
20.0000 meq | EXTENDED_RELEASE_TABLET | Freq: Every day | ORAL | Status: DC
  Administered 2016-02-06 – 2016-02-11 (×5): 20 meq via ORAL
  Filled 2016-02-06 (×8): qty 2

## 2016-02-06 MED ORDER — METOPROLOL TARTRATE 12.5 MG HALF TABLET
12.5000 mg | ORAL_TABLET | Freq: Once | ORAL | Status: AC
  Administered 2016-02-06: 12.5 mg via ORAL
  Filled 2016-02-06: qty 1

## 2016-02-06 NOTE — Progress Notes (Signed)
Advanced Heart Failure Rounding Note   Subjective:    PICC placed. Co-ox low. CVP high. Started on milrinone. Co-ox now 64%. Diuresing well. Weight down 2 pounds. Still sob. Creatinine continues to go up 2.5 today. CBG > 300 this am. Refused novalog   Objective:   Weight Range:  Vital Signs:   Temp:  [97.7 F (36.5 C)-98.1 F (36.7 C)] 98.1 F (36.7 C) (05/27 1158) Pulse Rate:  [91-123] 113 (05/27 1158) Resp:  [15-28] 21 (05/27 1158) BP: (110-149)/(65-128) 117/72 mmHg (05/27 1158) SpO2:  [88 %-100 %] 90 % (05/27 1158) Weight:  [89.359 kg (197 lb)] 89.359 kg (197 lb) (05/27 0350) Last BM Date: 02/06/16  Weight change: Filed Weights   02/05/16 1014 02/06/16 0350  Weight: 90.311 kg (199 lb 1.6 oz) 89.359 kg (197 lb)    Intake/Output:   Intake/Output Summary (Last 24 hours) at 02/06/16 1307 Last data filed at 02/06/16 1215  Gross per 24 hour  Intake 701.69 ml  Output   2370 ml  Net -1668.31 ml     Physical Exam:  GEN: Sitting in chair. Mildly dyspneic  HEENT: normal  Neck: JVP to ear cm, no masses Cardiac: Normal S1/S2, regular tachy + s3 2/6 systolic murmur RUSB. 2+ LE edema to knees bilaterally.  Respiratory: Bibasilar rales, no wheezing GI: distended MS: + presacral edema Skin: warm and dry Neuro: No focal deficits  Psych: Alert and oriented x 3, normal affect  Telemetry: ST 110  Labs: Basic Metabolic Panel:  Recent Labs Lab 02/05/16 1429 02/06/16 0555  NA 136 137  K 4.7 4.4  CL 104 101  CO2 25 26  GLUCOSE 182* 174*  BUN 37* 40*  CREATININE 2.40* 2.55*  CALCIUM 9.0 9.1  MG 2.0  --     Liver Function Tests:  Recent Labs Lab 02/05/16 1429  AST 30  ALT 16*  ALKPHOS 61  BILITOT 1.0  PROT 6.8  ALBUMIN 3.5   No results for input(s): LIPASE, AMYLASE in the last 168 hours. No results for input(s): AMMONIA in the last 168 hours.  CBC:  Recent Labs Lab 02/05/16 1429  WBC 5.8  NEUTROABS 4.0  HGB 12.0*  HCT 39.0  MCV 96.3    PLT 187    Cardiac Enzymes:  Recent Labs Lab 02/05/16 1429 02/05/16 2130 02/06/16 0120  TROPONINI 0.13* 0.14* 0.14*    BNP: BNP (last 3 results)  Recent Labs  01/05/16 1525 01/08/16 1354 02/05/16 1429  BNP 1597.7* 1319.2* 2128.4*    ProBNP (last 3 results) No results for input(s): PROBNP in the last 8760 hours.    Other results:  Imaging:  No results found.   Medications:     Scheduled Medications: . aspirin EC  81 mg Oral Daily  . atorvastatin  40 mg Oral QHS  . enoxaparin (LOVENOX) injection  30 mg Subcutaneous Q24H  . furosemide  80 mg Intravenous BID  . gabapentin  300 mg Oral QHS  . insulin aspart  0-15 Units Subcutaneous TID WC  . insulin aspart  0-5 Units Subcutaneous QHS  . multivitamin with minerals  1 tablet Oral QHS  . sertraline  100 mg Oral QHS  . sodium chloride flush  10-40 mL Intracatheter Q12H  . sodium chloride flush  3 mL Intravenous Q12H     Infusions: . milrinone 0.25 mcg/kg/min (02/06/16 1122)     PRN Medications:  sodium chloride, acetaminophen, nitroGLYCERIN, ondansetron (ZOFRAN) IV, sodium chloride flush, sodium chloride flush, traMADol   Assessment/Plan:  1. Acute on chronic systolic HF with5/17 echo => EF 25% with moderately dilated LV, mildly dilated RV with moderate to severely decreased RV systolic function, heavily calcified aortic valve with mild to moderate stenosis. This is considerably worse than his prior echo. He has severe biventricular failure. He is markedly volume overloaded on exam. This is complicated by A/CKD stage 3-> 4, creatinine 1.7 (at baseline).Initial co-ox 44%. No won milrinone 0.25 and co-ox up to 64%  - Diuresing well. Continue Lasix 80 mg IV bid. Give one dose metolszone.  - Holding Toprol with low output. No ACE/ARB with renal failure 2. CAD: s/p CABG with RIMA-RCA, LIMA-LAD, and seq SVG-OM1/OM2. He had DES to SVG-OM1/OM2 in 2014. No chest pain. ? If the significant fall in EF on  most recent echo may be related to progressive coronary disease.  - Continue ASA 81, statin.  - Needs left and right heart catheterization depending upon renal function.  3. Aortic stenosis: The aortic valve is heavily calcified but opens. Likely mild to possibly moderate aortic stenosis (opens reasonably in the setting of significantly decreased EF) => do not think severe.  4. DM2 - continue SSI 5. Acute on chronic renal failure, stage 4 - likely cardiorenal  Length of Stay: 1   Mose Colaizzi MD 02/06/2016, 1:07 PM  Advanced Heart Failure Team Pager [phone] (M-F; 7a - 4p)  Please contact [facility] for night-coverage after hours (4p -7a ) and weekends on [email or web address]

## 2016-02-06 NOTE — Progress Notes (Addendum)
Attempted to send patient down for 2 view chest xray. Patient becomes upset and raises his voice stating "I have had a xray already today." After looking to see if patient had indeed had a portable today, it was noted he had not had any xray today. Returned to patient's room to explain this to patient and he continued to raise his voice stating "She held up the pink sheet and showed it to me." Tried to calm patient and explain that the "pink sheet" that he is referring to was his EKG that was done this morning and that was to check his heart. I told him the xray is to check his lungs for possible fluid and infection. He is becoming more irate at this time and continues to refuse. He states he does not need one.  Will notify rounding MD in am that patient refused to get xray.  Currently, patient without signs of respiratory distress. Will continue to monitor.

## 2016-02-07 ENCOUNTER — Inpatient Hospital Stay (HOSPITAL_COMMUNITY): Payer: Medicare Other

## 2016-02-07 DIAGNOSIS — N179 Acute kidney failure, unspecified: Secondary | ICD-10-CM | POA: Insufficient documentation

## 2016-02-07 DIAGNOSIS — I2510 Atherosclerotic heart disease of native coronary artery without angina pectoris: Secondary | ICD-10-CM | POA: Insufficient documentation

## 2016-02-07 DIAGNOSIS — N189 Chronic kidney disease, unspecified: Secondary | ICD-10-CM

## 2016-02-07 LAB — BASIC METABOLIC PANEL
ANION GAP: 9 (ref 5–15)
BUN: 42 mg/dL — ABNORMAL HIGH (ref 6–20)
CO2: 29 mmol/L (ref 22–32)
Calcium: 9.1 mg/dL (ref 8.9–10.3)
Chloride: 98 mmol/L — ABNORMAL LOW (ref 101–111)
Creatinine, Ser: 2.63 mg/dL — ABNORMAL HIGH (ref 0.61–1.24)
GFR calc Af Amer: 26 mL/min — ABNORMAL LOW (ref >60)
GFR, EST NON AFRICAN AMERICAN: 22 mL/min — AB (ref >60)
GLUCOSE: 180 mg/dL — AB (ref 65–99)
POTASSIUM: 4.1 mmol/L (ref 3.5–5.1)
SODIUM: 136 mmol/L (ref 135–145)

## 2016-02-07 LAB — CARBOXYHEMOGLOBIN
Carboxyhemoglobin: 1.3 % (ref 0.5–1.5)
Methemoglobin: 1 % (ref 0.0–1.5)
O2 SAT: 52.4 %
TOTAL HEMOGLOBIN: 11.1 g/dL — AB (ref 13.5–18.0)

## 2016-02-07 LAB — GLUCOSE, CAPILLARY
GLUCOSE-CAPILLARY: 143 mg/dL — AB (ref 65–99)
GLUCOSE-CAPILLARY: 246 mg/dL — AB (ref 65–99)
Glucose-Capillary: 240 mg/dL — ABNORMAL HIGH (ref 65–99)
Glucose-Capillary: 162 mg/dL — ABNORMAL HIGH (ref 65–99)

## 2016-02-07 MED ORDER — PANTOPRAZOLE SODIUM 40 MG PO TBEC
40.0000 mg | DELAYED_RELEASE_TABLET | Freq: Once | ORAL | Status: AC
  Administered 2016-02-07: 40 mg via ORAL
  Filled 2016-02-07: qty 1

## 2016-02-07 MED ORDER — ADULT MULTIVITAMIN W/MINERALS CH
1.0000 | ORAL_TABLET | Freq: Every day | ORAL | Status: DC
  Administered 2016-02-07 – 2016-02-11 (×5): 1 via ORAL
  Filled 2016-02-07 (×5): qty 1

## 2016-02-07 MED ORDER — ALUM & MAG HYDROXIDE-SIMETH 200-200-20 MG/5ML PO SUSP
30.0000 mL | Freq: Once | ORAL | Status: AC
  Administered 2016-02-07: 30 mL via ORAL
  Filled 2016-02-07: qty 30

## 2016-02-07 MED ORDER — HYDRALAZINE HCL 25 MG PO TABS
12.5000 mg | ORAL_TABLET | Freq: Three times a day (TID) | ORAL | Status: DC
  Administered 2016-02-07 – 2016-02-09 (×6): 12.5 mg via ORAL
  Filled 2016-02-07 (×6): qty 1

## 2016-02-07 NOTE — Progress Notes (Signed)
Advanced Heart Failure Rounding Note   Subjective:    PICC placed on admit. Initial co-ox 43.8. Started on milrinone. Co-ox up to 64%. Back down to 52% today.  Diuresing well. Feels better. Weight down 6 pounds total. CVP 11-12.Creatinine 2.4->2.55->2.63, (baseline about 1.7) .   Refused SSI yesterday. Last night refused CXR and was arguing with nurse. Appears confused at times.   Objective:   Weight Range:  Vital Signs:   Temp:  [97.2 F (36.2 C)-98.7 F (37.1 C)] 97.2 F (36.2 C) (05/28 0835) Pulse Rate:  [85-97] 91 (05/28 0835) Resp:  [14-27] 16 (05/28 0835) BP: (101-120)/(70-87) 120/87 mmHg (05/28 0835) SpO2:  [90 %-98 %] 94 % (05/28 0835) Weight:  [87.952 kg (193 lb 14.4 oz)] 87.952 kg (193 lb 14.4 oz) (05/28 0638) Last BM Date: 02/06/16  Weight change: Filed Weights   02/05/16 1014 02/06/16 0350 02/07/16 0638  Weight: 90.311 kg (199 lb 1.6 oz) 89.359 kg (197 lb) 87.952 kg (193 lb 14.4 oz)    Intake/Output:   Intake/Output Summary (Last 24 hours) at 02/07/16 1251 Last data filed at 02/07/16 1200  Gross per 24 hour  Intake 1123.2 ml  Output   3440 ml  Net -2316.8 ml     Physical Exam:  GEN: Sitting in chair. Mildly dyspneic  HEENT: normal  Neck: JVP to ear cm, no masses Cardiac: PMI laterally displaced. Normal S1/S2, regular  + s3 distant HS. 2/6 systolic murmur RUSB. 2+ LE edema to knees bilaterally.  Respiratory: decreased at bases, no wheezing GI: distended. NT. NABS Skin: warm and dry Neuro: No focal deficits  Psych: Alert and oriented x 3, normal affect  Telemetry: NSR 80-90s  Labs: Basic Metabolic Panel:  Recent Labs Lab 02/05/16 1429 02/06/16 0555 02/07/16 0534  NA 136 137 136  K 4.7 4.4 4.1  CL 104 101 98*  CO2 GLUCOSE 182* 174* 180*  BUN 37* 40* 42*  CREATININE 2.40* 2.55* 2.63*  CALCIUM 9.0 9.1 9.1  MG 2.0  --   --     Liver Function Tests:  Recent Labs Lab 02/05/16 1429  AST 30  ALT 16*  ALKPHOS 61    BILITOT 1.0  PROT 6.8  ALBUMIN 3.5   No results for input(s): LIPASE, AMYLASE in the last 168 hours. No results for input(s): AMMONIA in the last 168 hours.  CBC:  Recent Labs Lab 02/05/16 1429  WBC 5.8  NEUTROABS 4.0  HGB 12.0*  HCT 39.0  MCV 96.3  PLT 187    Cardiac Enzymes:  Recent Labs Lab 02/05/16 1429 02/05/16 2130 02/06/16 0120  TROPONINI 0.13* 0.14* 0.14*    BNP: BNP (last 3 results)  Recent Labs  01/05/16 1525 01/08/16 1354 02/05/16 1429  BNP 1597.7* 1319.2* 2128.4*    ProBNP (last 3 results) No results for input(s): PROBNP in the last 8760 hours.    Other results:  Imaging: No results found.   Medications:     Scheduled Medications: . aspirin EC  81 mg Oral Daily  . atorvastatin  40 mg Oral QHS  . enoxaparin (LOVENOX) injection  30 mg Subcutaneous Q24H  . furosemide  80 mg Intravenous BID  . gabapentin  300 mg Oral QHS  . insulin aspart  0-15 Units Subcutaneous TID WC  . insulin aspart  0-5 Units Subcutaneous QHS  . metolazone  5 mg Oral Daily  . metoprolol tartrate  12.5 mg Oral BID  . multivitamin with minerals  1 tablet  Oral Daily  . potassium chloride  20 mEq Oral Daily  . sertraline  100 mg Oral QHS  . sodium chloride flush  10-40 mL Intracatheter Q12H  . sodium chloride flush  3 mL Intravenous Q12H    Infusions: . milrinone 0.25 mcg/kg/min (02/07/16 1232)    PRN Medications: sodium chloride, acetaminophen, nitroGLYCERIN, ondansetron (ZOFRAN) IV, sodium chloride flush, sodium chloride flush, traMADol   Assessment/Plan:   1. Acute on chronic systolic HF with 0/985/17 echo => EF 25% with moderately dilated LV, mildly dilated RV with moderate to severely decreased RV systolic function, heavily calcified aortic valve with mild to moderate stenosis. This is considerably worse than his prior echo. He has severe biventricular failure. He is markedly volume overloaded on exam. 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 Now on milrinone 0.25. Co-ox initially up to 64% but now back down to 52%. Will increase milrinone to 0.375, He may be nearing end-stage. - Diuresing well. Continue Lasix 80 mg IV bid. Creatinine continues to climb slowly.   - Stop toprol with low output. No ACE/ARB with renal failure. Start low-dose hydralazine,  2. CAD: s/p CABG with RIMA-RCA, LIMA-LAD, and seq SVG-OM1/OM2. He had DES to SVG-OM1/OM2 in 2014. No chest pain. ? If the significant fall in EF on most recent echo may be related to progressive coronary disease.  - Continue ASA 81, statin.  - Needs left and right heart catheterization but doubt renal function will permit coronary angio.  3. Aortic stenosis: The aortic valve is heavily calcified but opens. Likely mild to possibly moderate aortic stenosis (opens reasonably in the setting of significantly decreased EF) => do not think severe.  4. DM2 - continue SSI 5. Acute on chronic renal failure, stage 4 - likely cardiorenal  Long talk with patient and his family (including his daughter who is a PA) about the situation and fact that he may have end-stage HF with falling co-ox despite inotropic support. Not VAD candidate due to A/CKD. Will continue diuresis today. Increase milrinone to 0.375. Place TED hose. Stop b-blocker. If co-ox continues to fall (or renal function worsens) on milrinone will need to consider Palliative Care involvement.    Length of Stay: 2   Arvilla MeresBensimhon, Massiah Minjares MD 02/07/2016, 12:51 PM  Advanced Heart Failure Team Pager [phone] (M-F; 7a - 4p)  Please contact [facility] for night-coverage after hours (4p -7a ) and weekends on [email or web address]

## 2016-02-08 DIAGNOSIS — N189 Chronic kidney disease, unspecified: Secondary | ICD-10-CM

## 2016-02-08 DIAGNOSIS — N179 Acute kidney failure, unspecified: Secondary | ICD-10-CM

## 2016-02-08 LAB — GLUCOSE, CAPILLARY
GLUCOSE-CAPILLARY: 269 mg/dL — AB (ref 65–99)
Glucose-Capillary: 174 mg/dL — ABNORMAL HIGH (ref 65–99)
Glucose-Capillary: 282 mg/dL — ABNORMAL HIGH (ref 65–99)
Glucose-Capillary: 245 mg/dL — ABNORMAL HIGH (ref 65–99)

## 2016-02-08 LAB — BASIC METABOLIC PANEL
ANION GAP: 9 (ref 5–15)
BUN: 44 mg/dL — ABNORMAL HIGH (ref 6–20)
CALCIUM: 9.3 mg/dL (ref 8.9–10.3)
CO2: 31 mmol/L (ref 22–32)
CREATININE: 2.58 mg/dL — AB (ref 0.61–1.24)
Chloride: 93 mmol/L — ABNORMAL LOW (ref 101–111)
GFR, EST AFRICAN AMERICAN: 26 mL/min — AB (ref >60)
GFR, EST NON AFRICAN AMERICAN: 23 mL/min — AB (ref >60)
Glucose, Bld: 205 mg/dL — ABNORMAL HIGH (ref 65–99)
Potassium: 3.9 mmol/L (ref 3.5–5.1)
SODIUM: 133 mmol/L — AB (ref 135–145)

## 2016-02-08 LAB — CARBOXYHEMOGLOBIN
CARBOXYHEMOGLOBIN: 1.2 % (ref 0.5–1.5)
Methemoglobin: 0.9 % (ref 0.0–1.5)
O2 SAT: 56.2 %
Total hemoglobin: 12.1 g/dL — ABNORMAL LOW (ref 13.5–18.0)

## 2016-02-08 MED ORDER — SPIRONOLACTONE 25 MG PO TABS
12.5000 mg | ORAL_TABLET | Freq: Every day | ORAL | Status: DC
  Administered 2016-02-08 – 2016-02-11 (×4): 12.5 mg via ORAL
  Filled 2016-02-08 (×4): qty 1

## 2016-02-08 MED ORDER — AMIODARONE HCL 200 MG PO TABS
200.0000 mg | ORAL_TABLET | Freq: Two times a day (BID) | ORAL | Status: DC
  Administered 2016-02-08 – 2016-02-11 (×7): 200 mg via ORAL
  Filled 2016-02-08 (×7): qty 1

## 2016-02-08 MED ORDER — CETYLPYRIDINIUM CHLORIDE 0.05 % MT LIQD
7.0000 mL | Freq: Two times a day (BID) | OROMUCOSAL | Status: DC
  Administered 2016-02-09 – 2016-02-11 (×5): 7 mL via OROMUCOSAL

## 2016-02-08 MED ORDER — ISOSORBIDE MONONITRATE ER 30 MG PO TB24
30.0000 mg | ORAL_TABLET | Freq: Every day | ORAL | Status: DC
  Administered 2016-02-08 – 2016-02-09 (×2): 30 mg via ORAL
  Filled 2016-02-08 (×2): qty 1

## 2016-02-08 NOTE — Progress Notes (Signed)
Patient called out due to a nosebleed. RN at bedside. Patient stated, "I picked my nose hard and made it bleed." RN was able to get bleeding to stop.Will continue to monitor patient.

## 2016-02-08 NOTE — Evaluation (Signed)
Physical Therapy Evaluation Patient Details Name: Tristan SkainsFrank Baker MRN: [id number] DOB: 07/14/1941 Today's Date: 02/08/2016   History of Present Illness  Tristan SkainsFrank Baker is a 75 y.o. male with a hx of CAD s/p CABG in 1996 and subsequent PCI with DES to S-OM in 2014, ICM with prior EF 45% by echo in 2014, RBBB, DM, HTN, HL, prior DVT, CKD 3. Admitted with  pulmonary edema and small bilateral pleural effusions, SOB and LE swelling.  Clinical Impression  Pt admitted with above diagnosis. Pt currently with functional limitations due to the deficits listed below (see PT Problem List). Pt functioning at min guard level but suspect he'll progress quickly to supervision/mod I level.  Pt will benefit from skilled PT to increase their independence and safety with mobility to allow discharge to the venue listed below.       Follow Up Recommendations No PT follow up;Supervision - Intermittent    Equipment Recommendations  None recommended by PT    Recommendations for Other Services       Precautions / Restrictions Precautions Precautions: Other (comment) Precaution Comments: mild SOB with activity, EF 25-30% Restrictions Weight Bearing Restrictions: No      Mobility  Bed Mobility               General bed mobility comments: pt up in chair  Transfers Overall transfer level: Needs assistance Equipment used: None Transfers: Sit to/from Stand Sit to Stand: Min guard         General transfer comment: pt braces self against chair with back of the legs  Ambulation/Gait Ambulation/Gait assistance: Min guard Ambulation Distance (Feet): 225 Feet Assistive device: None Gait Pattern/deviations: Step-through pattern;Decreased stride length;Wide base of support Gait velocity: decreased Gait velocity interpretation: Below normal speed for age/gender General Gait Details: pt wit no overt episodes of LOB. Pt  with mild SOB, HR 108-118, SpO2 87-92% on RA  Stairs            Wheelchair  Mobility    Modified Rankin (Stroke Patients Only)       Balance Overall balance assessment: Needs assistance         Standing balance support: During functional activity   Standing balance comment: pt able to stand and urinate and then stand and wash hands without difficulty or instability                             Pertinent Vitals/Pain Pain Assessment: No/denies pain    Home Living Family/patient expects to be discharged to:: Private residence Living Arrangements: Spouse/significant other Available Help at Discharge: Family;Available 24 hours/day Type of Home: House Home Access: Stairs to enter Entrance Stairs-Rails: Can reach both Entrance Stairs-Number of Steps: 4-5 Home Layout: One level Home Equipment: None      Prior Function Level of Independence: Independent         Comments: pt still works     Higher education careers adviserHand Dominance   Dominant Hand: Right    Extremity/Trunk Assessment   Upper Extremity Assessment: Generalized weakness           Lower Extremity Assessment: Generalized weakness      Cervical / Trunk Assessment: Normal  Communication   Communication: No difficulties  Cognition Arousal/Alertness: Awake/alert Behavior During Therapy: WFL for tasks assessed/performed Overall Cognitive Status: Within Functional Limits for tasks assessed                      General Comments  General comments (skin integrity, edema, etc.): pt with noted bilat LE edema, L worse than R, pt with noted redness in L LE    Exercises        Assessment/Plan    PT Assessment Patient needs continued PT services  PT Diagnosis Generalized weakness   PT Problem List Decreased strength;Decreased activity tolerance;Decreased balance;Decreased mobility  PT Treatment Interventions DME instruction;Gait training;Stair training;Functional mobility training;Therapeutic activities;Therapeutic exercise;Balance training   PT Goals (Current goals can be found in  the Care Plan section) Acute Rehab PT Goals Patient Stated Goal: home PT Goal Formulation: With patient Time For Goal Achievement: 02/15/16 Potential to Achieve Goals: Good Additional Goals Additional Goal #1: Pt to score >19 on DGI to indicate minimal falls risk.    Frequency Min 2X/week   Barriers to discharge        Co-evaluation               End of Session Equipment Utilized During Treatment: Gait belt Activity Tolerance: Patient tolerated treatment well Patient left: in chair;with call bell/phone within reach Nurse Communication: Mobility status         Time: 9604-5409 PT Time Calculation (min) (ACUTE ONLY): 12 min   Charges:   PT Evaluation $PT Eval Low Complexity: 1 Procedure     PT G CodesMarcene Brawn 02/08/2016, 3:40 PM   Lewis Shock, PT, DPT Pager #: [phone] Office #: [phone]

## 2016-02-08 NOTE — Progress Notes (Signed)
Patient ID: Tristan Baker, male   DOB: 1941/08/05, 75 y.o.   MRN: [id number]    Advanced Heart Failure Rounding Note   Subjective:    PICC placed on admit. Initial co-ox 43.8%. Started on milrinone which has been increased to 0.375.  Today, co-ox up slightly to 56% from 52%.   Diuresing well. Feels better. Weight down another 4 lbs. CVP 13 => 9.  Creatinine 2.4->2.55->2.63->2.58, baseline about 1.7.   Short runs NSVT.   Objective:   Weight Range:  Vital Signs:   Temp:  [97.2 F (36.2 C)-98.7 F (37.1 C)] 98.5 F (36.9 C) (05/29 0731) Pulse Rate:  [85-99] 98 (05/29 0317) Resp:  [16-30] 16 (05/29 0317) BP: (103-120)/(69-87) 113/82 mmHg (05/29 0317) SpO2:  [91 %-100 %] 93 % (05/29 0317) Weight:  [189 lb 8 oz (85.957 kg)] 189 lb 8 oz (85.957 kg) (05/29 0328) Last BM Date: 02/07/16  Weight change: Filed Weights   02/06/16 0350 02/07/16 0638 02/08/16 0328  Weight: 197 lb (89.359 kg) 193 lb 14.4 oz (87.952 kg) 189 lb 8 oz (85.957 kg)    Intake/Output:   Intake/Output Summary (Last 24 hours) at 02/08/16 0808 Last data filed at 02/08/16 0735  Gross per 24 hour  Intake   1421 ml  Output   4370 ml  Net  -2949 ml     Physical Exam:  GEN: Sitting in chair. Mildly dyspneic  HEENT: normal  Neck: JVP 8-9 cm, Baker masses Cardiac: PMI laterally displaced. Normal S1/S2, regular  + s3 distant HS. 2/6 systolic murmur RUSB. Trace ankle edema.  Respiratory: decreased at bases, Baker wheezing GI: distended. NT. NABS Skin: warm and dry Neuro: Baker focal deficits  Psych: Alert and oriented x 3, normal affect  Telemetry: NSR 90s, short runs NSVT  Labs: Basic Metabolic Panel:  Recent Labs Lab 02/05/16 1429 02/06/16 0555 02/07/16 0534 02/08/16 0526  NA 136 137 136 133*  K 4.7 4.4 4.1 3.9  CL 104 101 98* 93*  CO2 25 26 29 31   GLUCOSE 182* 174* 180* 205*  BUN 37* 40* 42* 44*  CREATININE 2.40* 2.55* 2.63* 2.58*  CALCIUM 9.0 9.1 9.1 9.3  MG 2.0  --   --   --     Liver  Function Tests:  Recent Labs Lab 02/05/16 1429  AST 30  ALT 16*  ALKPHOS 61  BILITOT 1.0  PROT 6.8  ALBUMIN 3.5   Baker results for input(s): LIPASE, AMYLASE in the last 168 hours. Baker results for input(s): AMMONIA in the last 168 hours.  CBC:  Recent Labs Lab 02/05/16 1429  WBC 5.8  NEUTROABS 4.0  HGB 12.0*  HCT 39.0  MCV 96.3  PLT 187    Cardiac Enzymes:  Recent Labs Lab 02/05/16 1429 02/05/16 2130 02/06/16 0120  TROPONINI 0.13* 0.14* 0.14*    BNP: BNP (last 3 results)  Recent Labs  01/05/16 1525 01/08/16 1354 02/05/16 1429  BNP 1597.7* 1319.2* 2128.4*    ProBNP (last 3 results) Baker results for input(s): PROBNP in the last 8760 hours.    Other results:  Imaging: Dg Chest Port 1 View  02/07/2016  CLINICAL DATA:  Distant heart failure. Bilateral lower extremity edema. EXAM: PORTABLE CHEST 1 VIEW COMPARISON:  01/05/2016 FINDINGS: Right PICC line tip:  SVC. Mild enlargement of the cardiopericardial silhouette and prior CABG. Bibasilar airspace opacities with poor definition of the hemidiaphragms, at least partially due to small bilateral pleural effusions. Mild airway thickening. Aortic arch atherosclerosis.  Thoracic spondylosis. IMPRESSION: 1.  Mild enlargement of the cardiopericardial silhouette. 2. Bilateral pleural effusions. Right greater than left basilar airspace opacity potentially from atelectasis or pneumonia. 3. New right PICC line tip: SVC. 4. Atherosclerotic aortic arch. 5. Airway thickening is present, suggesting bronchitis or reactive airways disease. Electronically Signed   By: Gaylyn Rong M.D.   On: 02/07/2016 14:02     Medications:     Scheduled Medications: . aspirin EC  81 mg Oral Daily  . atorvastatin  40 mg Oral QHS  . enoxaparin (LOVENOX) injection  30 mg Subcutaneous Q24H  . furosemide  80 mg Intravenous BID  . gabapentin  300 mg Oral QHS  . hydrALAZINE  12.5 mg Oral Q8H  . insulin aspart  0-15 Units Subcutaneous TID WC    . insulin aspart  0-5 Units Subcutaneous QHS  . isosorbide mononitrate  30 mg Oral Daily  . metolazone  5 mg Oral Daily  . multivitamin with minerals  1 tablet Oral Daily  . potassium chloride  20 mEq Oral Daily  . sertraline  100 mg Oral QHS  . sodium chloride flush  10-40 mL Intracatheter Q12H  . sodium chloride flush  3 mL Intravenous Q12H  . spironolactone  12.5 mg Oral Daily    Infusions: . milrinone 0.375 mcg/kg/min (02/08/16 0753)    PRN Medications: sodium chloride, acetaminophen, nitroGLYCERIN, ondansetron (ZOFRAN) IV, sodium chloride flush, sodium chloride flush, traMADol   Assessment/Plan:   1. Acute on chronic systolic HF with 1/61 echo => EF 25% with moderately dilated LV, mildly dilated RV with moderate to severely decreased RV systolic function, heavily calcified aortic valve with mild to moderate stenosis. This is considerably worse than his prior echo. He has severe biventricular failure. He is markedly volume overloaded on exam. 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  Initial co-ox 44%. Now on milrinone 0.375. Co-ox initially up to 64% but dropped to 52% and now up to 56% with increase in milrinone.  - Continue milrinone 0.375.  I am concerned that weaning is going to be difficult. - Diuresing well. Continue Lasix 80 mg IV bid + metolazone today, likely to po tomorrow. Creatinine has stabilized.   - Off Toprol XL with low output. Baker ACE/ARB with renal failure. He is now on hydralazine/Imdur at low dose. - Add spironolactone 12.5 daily today.  - Possible RHC later this week.  - Discussed situation with wife and patient: Not LVAD candidate with renal dysfunction and now suspect end-stage CHF.  I am concerned that we will not be able to get him off milrinone.  If needed, he is ok with going home on it.  If renal function worsens, would consider palliative care.  2. CAD: s/p CABG with RIMA-RCA, LIMA-LAD, and seq SVG-OM1/OM2. He had DES  to SVG-OM1/OM2 in 2014. Baker chest pain. ? If the significant fall in EF on most recent echo may be related to progressive coronary disease, Baker ACS this admission.  - Continue ASA 81, statin.  - Doubt renal function will permit coronary angiography.  3. Aortic stenosis: The aortic valve is heavily calcified but opens. Likely mild to possibly moderate aortic stenosis (opens reasonably in the setting of significantly decreased EF) => do not think severe.  4. DM2 - continue SSI 5. Acute on chronic renal failure, stage 4 - likely cardiorenal.  6. NSVT: Multiple runs now that on higher milrinone.  Add amiodarone 200 mg bid.   Length of Stay: 3   Marca Ancona MD  02/08/2016, 8:08 AM  Advanced Heart Failure Team Pager [phone] (M-F; 7a - 4p)  Please contact [facility] for night-coverage after hours (4p -7a ) and weekends on [email or web address]

## 2016-02-09 LAB — BASIC METABOLIC PANEL
ANION GAP: 12 (ref 5–15)
BUN: 43 mg/dL — ABNORMAL HIGH (ref 6–20)
CALCIUM: 9.5 mg/dL (ref 8.9–10.3)
CO2: 32 mmol/L (ref 22–32)
CREATININE: 2.59 mg/dL — AB (ref 0.61–1.24)
Chloride: 89 mmol/L — ABNORMAL LOW (ref 101–111)
GFR, EST AFRICAN AMERICAN: 26 mL/min — AB (ref >60)
GFR, EST NON AFRICAN AMERICAN: 23 mL/min — AB (ref >60)
Glucose, Bld: 192 mg/dL — ABNORMAL HIGH (ref 65–99)
Potassium: 3.8 mmol/L (ref 3.5–5.1)
SODIUM: 133 mmol/L — AB (ref 135–145)

## 2016-02-09 LAB — GLUCOSE, CAPILLARY
GLUCOSE-CAPILLARY: 195 mg/dL — AB (ref 65–99)
GLUCOSE-CAPILLARY: 403 mg/dL — AB (ref 65–99)
GLUCOSE-CAPILLARY: 403 mg/dL — AB (ref 65–99)
Glucose-Capillary: 502 mg/dL — ABNORMAL HIGH (ref 65–99)
Glucose-Capillary: 385 mg/dL — ABNORMAL HIGH (ref 65–99)
Glucose-Capillary: 273 mg/dL — ABNORMAL HIGH (ref 65–99)

## 2016-02-09 LAB — CBC
HCT: 38.2 % — ABNORMAL LOW (ref 39.0–52.0)
HEMOGLOBIN: 12.2 g/dL — AB (ref 13.0–17.0)
MCH: 29.3 pg (ref 26.0–34.0)
MCHC: 31.9 g/dL (ref 30.0–36.0)
MCV: 91.6 fL (ref 78.0–100.0)
PLATELETS: 218 10*3/uL (ref 150–400)
RBC: 4.17 MIL/uL — ABNORMAL LOW (ref 4.22–5.81)
RDW: 14 % (ref 11.5–15.5)
WBC: 8.4 10*3/uL (ref 4.0–10.5)

## 2016-02-09 LAB — CARBOXYHEMOGLOBIN
CARBOXYHEMOGLOBIN: 1.7 % — AB (ref 0.5–1.5)
Methemoglobin: 0.8 % (ref 0.0–1.5)
O2 SAT: 69.1 %
Total hemoglobin: 12.9 g/dL — ABNORMAL LOW (ref 13.5–18.0)

## 2016-02-09 LAB — MAGNESIUM: MAGNESIUM: 1.9 mg/dL (ref 1.7–2.4)

## 2016-02-09 MED ORDER — SODIUM CHLORIDE 0.9% FLUSH: 3.0000 mL | INTRAVENOUS | Status: DC | PRN

## 2016-02-09 MED ORDER — POTASSIUM CHLORIDE CRYS ER 20 MEQ PO TBCR
40.0000 meq | EXTENDED_RELEASE_TABLET | Freq: Once | ORAL | Status: AC
  Administered 2016-02-09: 40 meq via ORAL
  Filled 2016-02-09: qty 2

## 2016-02-09 MED ORDER — INSULIN ASPART 100 UNIT/ML ~~LOC~~ SOLN
0.0000 [IU] | Freq: Three times a day (TID) | SUBCUTANEOUS | Status: DC
Start: 2016-02-09 — End: 2016-02-11
  Administered 2016-02-09: 15 [IU] via SUBCUTANEOUS
  Administered 2016-02-09: 8 [IU] via SUBCUTANEOUS
  Administered 2016-02-10: 15 [IU] via SUBCUTANEOUS
  Administered 2016-02-10: 3 [IU] via SUBCUTANEOUS
  Administered 2016-02-11 (×2): 5 [IU] via SUBCUTANEOUS

## 2016-02-09 MED ORDER — POTASSIUM CHLORIDE CRYS ER 20 MEQ PO TBCR: 20.0000 meq | EXTENDED_RELEASE_TABLET | Freq: Once | ORAL | Status: DC

## 2016-02-09 MED ORDER — METOLAZONE 5 MG PO TABS
5.0000 mg | ORAL_TABLET | Freq: Once | ORAL | Status: AC
  Administered 2016-02-09: 5 mg via ORAL
  Filled 2016-02-09: qty 1

## 2016-02-09 MED ORDER — SODIUM CHLORIDE 0.9 % IV SOLN: 250.0000 mL | INTRAVENOUS | Status: DC | PRN

## 2016-02-09 MED ORDER — MAGNESIUM OXIDE 400 (241.3 MG) MG PO TABS
400.0000 mg | ORAL_TABLET | Freq: Every day | ORAL | Status: DC
Start: 2016-02-09 — End: 2016-02-11
  Administered 2016-02-09 – 2016-02-11 (×3): 400 mg via ORAL
  Filled 2016-02-09 (×3): qty 1

## 2016-02-09 MED ORDER — ASPIRIN 81 MG PO CHEW
81.0000 mg | CHEWABLE_TABLET | ORAL | Status: AC
  Administered 2016-02-10: 81 mg via ORAL
  Filled 2016-02-09: qty 1

## 2016-02-09 MED ORDER — INSULIN DETEMIR 100 UNIT/ML ~~LOC~~ SOLN
10.0000 [IU] | Freq: Every day | SUBCUTANEOUS | Status: DC
  Administered 2016-02-09 – 2016-02-11 (×3): 10 [IU] via SUBCUTANEOUS
  Filled 2016-02-09 (×3): qty 0.1

## 2016-02-09 MED ORDER — SODIUM CHLORIDE 0.9 % IV SOLN
INTRAVENOUS | Status: DC
  Administered 2016-02-10: 06:00:00 via INTRAVENOUS

## 2016-02-09 MED ORDER — HYDRALAZINE HCL 25 MG PO TABS
25.0000 mg | ORAL_TABLET | Freq: Three times a day (TID) | ORAL | Status: DC
  Administered 2016-02-09 – 2016-02-10 (×3): 25 mg via ORAL
  Filled 2016-02-09 (×3): qty 1

## 2016-02-09 MED ORDER — FUROSEMIDE 40 MG PO TABS
40.0000 mg | ORAL_TABLET | Freq: Two times a day (BID) | ORAL | Status: DC
  Administered 2016-02-09: 40 mg via ORAL
  Filled 2016-02-09: qty 1

## 2016-02-09 MED ORDER — SODIUM CHLORIDE 0.9% FLUSH: 3.0000 mL | Freq: Two times a day (BID) | INTRAVENOUS | Status: DC

## 2016-02-09 NOTE — Progress Notes (Signed)
Inpatient Diabetes Program Recommendations  AACE/ADA: New Consensus Statement on Inpatient Glycemic Control (2015)  Target Ranges:  Prepandial:   less than 140 mg/dL      Peak postprandial:   less than 180 mg/dL (1-2 hours)      Critically ill patients:  140 - 180 mg/dL   Review of Glycemic Control  Inpatient Diabetes Program Recommendations:  Insulin - Basal: add Levemir 10 units  Thank you  Piedad ClimesGina Tallie Hevia BSN, RN,CDE Inpatient Diabetes Coordinator [phone] (team pager)

## 2016-02-09 NOTE — Care Management Important Message (Signed)
Important Message  Patient Details  Name: Corliss SkainsFrank Hummel MRN: [id number] Date of Birth: 02/17/1941   Medicare Important Message Given:  Yes    Bernadette HoitShoffner, Nader Boys Coleman 02/09/2016, 7:55 AM

## 2016-02-09 NOTE — Progress Notes (Signed)
Patient ID: Tristan Baker, male   DOB: [birth year], 75 y.o.   MRN: [id number]    Advanced Heart Failure Rounding Note   Subjective:    PICC placed on admit. Initial co-ox 43.8%. Started on milrinone which has been increased to 0.375.  Today, co-ox up improved at 69.1% on mirlinone 0.375.    Feeling OK.   Walked yesterday without much SOB.  Weight down another 5 lbs. CVP remains around 8-9.  Creatinine 2.4->2.55->2.63->2.58 ->2.59, baseline about 1.7.   Short runs NSVT.   Objective:   Weight Range:  Vital Signs:   Temp:  [97.9 F (36.6 C)-98.7 F (37.1 C)] 97.9 F (36.6 C) (05/30 0337) Pulse Rate:  [53-104] 101 (05/30 0400) Resp:  [11-189] 16 (05/30 0400) BP: (113-132)/(66-82) 124/66 mmHg (05/30 0337) SpO2:  [89 %-97 %] 93 % (05/30 0400) Weight:  [184 lb 1.6 oz (83.507 kg)] 184 lb 1.6 oz (83.507 kg) (05/30 0500) Last BM Date: 02/07/16  Weight change: Filed Weights   02/07/16 0638 02/08/16 0328 02/09/16 0500  Weight: 193 lb 14.4 oz (87.952 kg) 189 lb 8 oz (85.957 kg) 184 lb 1.6 oz (83.507 kg)    Intake/Output:   Intake/Output Summary (Last 24 hours) at 02/09/16 0735 Last data filed at 02/09/16 0700  Gross per 24 hour  Intake 1324.8 ml  Output   3825 ml  Net -2500.2 ml     Physical Exam:  CVP: 8-9 GEN: Reclined in chair. NAD HEENT: normal  Neck: JVP 8-9 cm, no masses Cardiac: PMI laterally displaced. Normal S1/S2, regular  + s3 distant HS. 2/6 systolic murmur RUSB. Trace ankle edema.  Respiratory: Decreased breath sounds bibasilar.  GI: mildly distended, NT, no HSM. No bruits or masses. +BS  Skin: warm and dry Neuro: No focal deficits  Psych: Alert and oriented x 3, normal affect  Telemetry: NSR 90-100s, short runs NSVT  Labs: Basic Metabolic Panel:  Recent Labs Lab 02/05/16 1429 02/06/16 0555 02/07/16 0534 02/08/16 0526 02/09/16 0515  NA 136 137 136 133* 133*  K 4.7 4.4 4.1 3.9 3.8  CL 104 101 98* 93* 89*  CO2 25 26 29 31  32  GLUCOSE 182*  174* 180* 205* 192*  BUN 37* 40* 42* 44* 43*  CREATININE 2.40* 2.55* 2.63* 2.58* 2.59*  CALCIUM 9.0 9.1 9.1 9.3 9.5  MG 2.0  --   --   --  1.9    Liver Function Tests:  Recent Labs Lab 02/05/16 1429  AST 30  ALT 16*  ALKPHOS 61  BILITOT 1.0  PROT 6.8  ALBUMIN 3.5   No results for input(s): LIPASE, AMYLASE in the last 168 hours. No results for input(s): AMMONIA in the last 168 hours.  CBC:  Recent Labs Lab 02/05/16 1429 02/09/16 0515  WBC 5.8 8.4  NEUTROABS 4.0  --   HGB 12.0* 12.2*  HCT 39.0 38.2*  MCV 96.3 91.6  PLT 187 218    Cardiac Enzymes:  Recent Labs Lab 02/05/16 1429 02/05/16 2130 02/06/16 0120  TROPONINI 0.13* 0.14* 0.14*    BNP: BNP (last 3 results)  Recent Labs  01/05/16 1525 01/08/16 1354 02/05/16 1429  BNP 1597.7* 1319.2* 2128.4*    ProBNP (last 3 results) No results for input(s): PROBNP in the last 8760 hours.    Other results:  Imaging: Dg Chest Port 1 View  02/07/2016  CLINICAL DATA:  Distant heart failure. Bilateral lower extremity edema. EXAM: PORTABLE CHEST 1 VIEW COMPARISON:  01/05/2016 FINDINGS: Right PICC line tip:  SVC. Mild  enlargement of the cardiopericardial silhouette and prior CABG. Bibasilar airspace opacities with poor definition of the hemidiaphragms, at least partially due to small bilateral pleural effusions. Mild airway thickening. Aortic arch atherosclerosis.  Thoracic spondylosis. IMPRESSION: 1. Mild enlargement of the cardiopericardial silhouette. 2. Bilateral pleural effusions. Right greater than left basilar airspace opacity potentially from atelectasis or pneumonia. 3. New right PICC line tip: SVC. 4. Atherosclerotic aortic arch. 5. Airway thickening is present, suggesting bronchitis or reactive airways disease. Electronically Signed   By: Gaylyn Rong M.D.   On: 02/07/2016 14:02     Medications:     Scheduled Medications: . amiodarone  200 mg Oral BID  . antiseptic oral rinse  7 mL Mouth Rinse  BID  . aspirin EC  81 mg Oral Daily  . atorvastatin  40 mg Oral QHS  . enoxaparin (LOVENOX) injection  30 mg Subcutaneous Q24H  . furosemide  80 mg Intravenous BID  . gabapentin  300 mg Oral QHS  . hydrALAZINE  12.5 mg Oral Q8H  . insulin aspart  0-15 Units Subcutaneous TID WC  . insulin aspart  0-5 Units Subcutaneous QHS  . isosorbide mononitrate  30 mg Oral Daily  . metolazone  5 mg Oral Daily  . multivitamin with minerals  1 tablet Oral Daily  . potassium chloride  20 mEq Oral Daily  . sertraline  100 mg Oral QHS  . sodium chloride flush  10-40 mL Intracatheter Q12H  . sodium chloride flush  3 mL Intravenous Q12H  . spironolactone  12.5 mg Oral Daily    Infusions: . milrinone 0.375 mcg/kg/min (02/09/16 0150)    PRN Medications: sodium chloride, acetaminophen, nitroGLYCERIN, ondansetron (ZOFRAN) IV, sodium chloride flush, sodium chloride flush, traMADol   Assessment/Plan:   1. Acute on chronic systolic HF with 1/61 echo => EF 25% with moderately dilated LV, mildly dilated RV with moderate to severely decreased RV systolic function, heavily calcified aortic valve with mild to moderate stenosis. This is considerably worse than his prior echo. He has severe biventricular failure. On admission, was markedly volume overloaded on exam. 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  Initial co-ox 44%. Now on milrinone 0.375. Co-ox stable at 69% on increased milrinone. - Continue milrinone 0.375, will try to start weaning tomorrow after completion of diuresis.  Concerned that weaning is going to be difficult. - Diuresing well, though CVP still up slightly. Give Lasix 80 mg this am, and transition to po this evening at 40 mg po BID (previously on 40 mg daily at home). If poor response may need to switch to torsemide.   Stop metolazone after today.  - Off Toprol XL with low output. No ACE/ARB with renal failure. He is now on hydralazine/Imdur at low dose,  increase hydralazine to 25 mg tid as we try to start weaning milrinone. - Continue spironolactone 12.5 daily. - RHC tomorrow.  - Have discussed situation with wife and patient: Not LVAD candidate with renal dysfunction and now suspect end-stage CHF.  I am concerned that we will not be able to get him off milrinone.  If needed, he is ok with going home on it.  If renal function worsens, would consider palliative care.  2. CAD: s/p CABG with RIMA-RCA, LIMA-LAD, and seq SVG-OM1/OM2. He had DES to SVG-OM1/OM2 in 2014. No chest pain. ? If the significant fall in EF on most recent echo may be related to progressive coronary disease, no ACS this admission.  - Continue ASA  81, statin.  - Doubt renal function will permit coronary angiography.  3. Aortic stenosis: The aortic valve is heavily calcified but opens. Likely mild to possibly moderate aortic stenosis (opens reasonably in the setting of significantly decreased EF) => do not think severe.  4. DM2 - continue SSI 5. Acute on chronic renal failure, stage 4 - likely cardiorenal.  6. NSVT: Continues to have occasional runs on higher milrinone.   - Continue amiodarone 200 mg bid.  - Magnesium 1.9.  Start on Mg 400 mg daily.   Length of Stay: 4  Tristan Baker 02/09/2016, 7:35 AM   Patient seen with PA, agree with the above note.  Convert to po Lasix this evening.  Increase hydralazine.  Will try to start weaning down milrinone tomorrow.  Will take for RHC tomorrow morning.  I am concerned that he may require home milrinone.  Not LVAD candidate presently with renal dysfunction.   Tristan Baker 02/09/2016 8:32 AM   Advanced Heart Failure Team Pager [phone] (M-F; 7a - 4p)  Please contact [facility] for night-coverage after hours (4p -7a ) and weekends on [email or web address]

## 2016-02-09 NOTE — Progress Notes (Signed)
CARDIAC REHAB PHASE I   PRE:  Rate/Rhythm: 106 ST with PVCs    BP: sitting 120/69    SaO2: 93 2L  MODE:  Ambulation: 520 ft   POST:  Rate/Rhythm: 113 ST with PVC/couplets    BP: sitting 121/70     SaO2: 96 2L, 93 RA  Pt somewhat unsteady on his feet in the room, walking to door walking with gait belt support (no device). Pt became steadier with increased pace in hallway. Able to walk 1 1/2 laps without rest, on 2L O2. No c/o. SaO2 96 2L after walk, d/c'd O2 and will ask RN to watch him on RA. Will continue to follow. Pt's wife present.  [phone]   Harriet MassonRandi Kristan [facility]ash[facility] CES, ACSM 02/09/2016 10:37 AM

## 2016-02-09 NOTE — Progress Notes (Signed)
Pt bedtime BG 403. Paged Dr. Zachery ConchFriedman, to give 5U of insulin and continue with achs checks. Patient will be NPO at midnight for cath and received 10U of levemir this afternoon in effort to control sugars. Will continue to monitor.

## 2016-02-10 ENCOUNTER — Encounter (HOSPITAL_COMMUNITY)
Admission: RE | Disposition: A | Discharge status: Skilled Nursing Facility | Payer: Self-pay | Source: Ambulatory Visit | Attending: Cardiology

## 2016-02-10 ENCOUNTER — Encounter (HOSPITAL_COMMUNITY): Payer: Self-pay | Admitting: Cardiology

## 2016-02-10 DIAGNOSIS — I472 Ventricular tachycardia: Secondary | ICD-10-CM

## 2016-02-10 DIAGNOSIS — I509 Heart failure, unspecified: Secondary | ICD-10-CM

## 2016-02-10 HISTORY — PX: CARDIAC CATHETERIZATION: SHX172

## 2016-02-10 LAB — PROTIME-INR
INR: 1.13 (ref 0.00–1.49)
PROTHROMBIN TIME: 14.7 s (ref 11.6–15.2)

## 2016-02-10 LAB — BASIC METABOLIC PANEL
Anion gap: 10 (ref 5–15)
BUN: 41 mg/dL — AB (ref 6–20)
CHLORIDE: 88 mmol/L — AB (ref 101–111)
CO2: 33 mmol/L — AB (ref 22–32)
CREATININE: 2.65 mg/dL — AB (ref 0.61–1.24)
Calcium: 9.1 mg/dL (ref 8.9–10.3)
GFR calc Af Amer: 26 mL/min — ABNORMAL LOW (ref >60)
GFR calc non Af Amer: 22 mL/min — ABNORMAL LOW (ref >60)
GLUCOSE: 243 mg/dL — AB (ref 65–99)
Potassium: 4 mmol/L (ref 3.5–5.1)
Sodium: 131 mmol/L — ABNORMAL LOW (ref 135–145)

## 2016-02-10 LAB — POCT I-STAT 3, VENOUS BLOOD GAS (G3P V)
ACID-BASE EXCESS: 8 mmol/L — AB (ref 0.0–2.0)
Acid-Base Excess: 8 mmol/L — ABNORMAL HIGH (ref 0.0–2.0)
BICARBONATE: 34.7 meq/L — AB (ref 20.0–24.0)
Bicarbonate: 34.8 mEq/L — ABNORMAL HIGH (ref 20.0–24.0)
O2 SAT: 52 %
O2 SAT: 52 %
PH VEN: 7.401 — AB (ref 7.250–7.300)
TCO2: 36 mmol/L (ref 0–100)
TCO2: 36 mmol/L (ref 0–100)
pCO2, Ven: 54.6 mmHg — ABNORMAL HIGH (ref 45.0–50.0)
pCO2, Ven: 55.8 mmHg — ABNORMAL HIGH (ref 45.0–50.0)
pH, Ven: 7.413 — ABNORMAL HIGH (ref 7.250–7.300)
pO2, Ven: 28 mmHg — ABNORMAL LOW (ref 31.0–45.0)
pO2, Ven: 28 mmHg — ABNORMAL LOW (ref 31.0–45.0)

## 2016-02-10 LAB — GLUCOSE, CAPILLARY
GLUCOSE-CAPILLARY: 159 mg/dL — AB (ref 65–99)
Glucose-Capillary: 191 mg/dL — ABNORMAL HIGH (ref 65–99)
Glucose-Capillary: 366 mg/dL — ABNORMAL HIGH (ref 65–99)
Glucose-Capillary: 234 mg/dL — ABNORMAL HIGH (ref 65–99)

## 2016-02-10 LAB — CARBOXYHEMOGLOBIN
Carboxyhemoglobin: 1.4 % (ref 0.5–1.5)
METHEMOGLOBIN: 0.9 % (ref 0.0–1.5)
O2 Saturation: 68.5 %
Total hemoglobin: 11.8 g/dL — ABNORMAL LOW (ref 13.5–18.0)

## 2016-02-10 SURGERY — RIGHT HEART CATH

## 2016-02-10 MED ORDER — MIDAZOLAM HCL 2 MG/2ML IJ SOLN
INTRAMUSCULAR | Status: DC | PRN
  Administered 2016-02-10: 1 mg via INTRAVENOUS

## 2016-02-10 MED ORDER — HEPARIN SODIUM (PORCINE) 5000 UNIT/ML IJ SOLN
5000.0000 [IU] | Freq: Three times a day (TID) | INTRAMUSCULAR | Status: DC
  Administered 2016-02-11 (×2): 5000 [IU] via SUBCUTANEOUS
  Filled 2016-02-10 (×2): qty 1

## 2016-02-10 MED ORDER — HEPARIN (PORCINE) IN NACL 2-0.9 UNIT/ML-% IJ SOLN
INTRAMUSCULAR | Status: AC
  Filled 2016-02-10: qty 1000

## 2016-02-10 MED ORDER — TORSEMIDE 20 MG PO TABS
40.0000 mg | ORAL_TABLET | Freq: Every day | ORAL | Status: DC
  Administered 2016-02-10 – 2016-02-11 (×2): 40 mg via ORAL
  Filled 2016-02-10 (×2): qty 2

## 2016-02-10 MED ORDER — ISOSORBIDE MONONITRATE ER 60 MG PO TB24
60.0000 mg | ORAL_TABLET | Freq: Every day | ORAL | Status: DC
  Administered 2016-02-10 – 2016-02-11 (×2): 60 mg via ORAL
  Filled 2016-02-10 (×2): qty 1

## 2016-02-10 MED ORDER — ACETAMINOPHEN 325 MG PO TABS: 650.0000 mg | ORAL_TABLET | ORAL | Status: DC | PRN

## 2016-02-10 MED ORDER — FENTANYL CITRATE (PF) 100 MCG/2ML IJ SOLN
INTRAMUSCULAR | Status: DC | PRN
  Administered 2016-02-10: 25 ug via INTRAVENOUS

## 2016-02-10 MED ORDER — SODIUM CHLORIDE 0.9% FLUSH: 3.0000 mL | INTRAVENOUS | Status: DC | PRN

## 2016-02-10 MED ORDER — LIDOCAINE HCL (PF) 1 % IJ SOLN
INTRAMUSCULAR | Status: AC
  Filled 2016-02-10: qty 30

## 2016-02-10 MED ORDER — VERAPAMIL HCL 2.5 MG/ML IV SOLN
INTRAVENOUS | Status: AC
  Filled 2016-02-10: qty 2

## 2016-02-10 MED ORDER — FENTANYL CITRATE (PF) 100 MCG/2ML IJ SOLN
INTRAMUSCULAR | Status: AC
  Filled 2016-02-10: qty 2

## 2016-02-10 MED ORDER — SODIUM CHLORIDE 0.9 % IV SOLN: 250.0000 mL | INTRAVENOUS | Status: DC | PRN

## 2016-02-10 MED ORDER — SODIUM CHLORIDE 0.9% FLUSH: 3.0000 mL | Freq: Two times a day (BID) | INTRAVENOUS | Status: DC

## 2016-02-10 MED ORDER — HEPARIN (PORCINE) IN NACL 2-0.9 UNIT/ML-% IJ SOLN
INTRAMUSCULAR | Status: DC | PRN
  Administered 2016-02-10: 500 mL

## 2016-02-10 MED ORDER — MIDAZOLAM HCL 2 MG/2ML IJ SOLN
INTRAMUSCULAR | Status: AC
  Filled 2016-02-10: qty 2

## 2016-02-10 MED ORDER — HYDRALAZINE HCL 25 MG PO TABS
37.5000 mg | ORAL_TABLET | Freq: Three times a day (TID) | ORAL | Status: DC
  Administered 2016-02-10 – 2016-02-11 (×4): 37.5 mg via ORAL
  Filled 2016-02-10 (×4): qty 2

## 2016-02-10 MED ORDER — SODIUM CHLORIDE 0.9% FLUSH
3.0000 mL | Freq: Two times a day (BID) | INTRAVENOUS | Status: DC
  Administered 2016-02-11: 3 mL via INTRAVENOUS

## 2016-02-10 MED ORDER — ONDANSETRON HCL 4 MG/2ML IJ SOLN: 4.0000 mg | Freq: Four times a day (QID) | INTRAMUSCULAR | Status: DC | PRN

## 2016-02-10 MED ORDER — LIDOCAINE HCL (PF) 1 % IJ SOLN
INTRAMUSCULAR | Status: DC | PRN
  Administered 2016-02-10: 20 mL via SUBCUTANEOUS

## 2016-02-10 SURGICAL SUPPLY — 9 items
CATH SWAN GANZ 7F STRAIGHT (CATHETERS) ×2 IMPLANT
PACK CARDIAC CATHETERIZATION (CUSTOM PROCEDURE TRAY) ×2 IMPLANT
PROTECTION STATION PRESSURIZED (MISCELLANEOUS) ×2
SHEATH FAST CATH BRACH 5F 5CM (SHEATH) ×2 IMPLANT
SHEATH PINNACLE 7F 10CM (SHEATH) ×2 IMPLANT
STATION PROTECTION PRESSURIZED (MISCELLANEOUS) ×1 IMPLANT
TRANSDUCER W/STOPCOCK (MISCELLANEOUS) ×2 IMPLANT
TUBING ART PRESS 72  MALE/FEM (TUBING) ×1
TUBING ART PRESS 72 MALE/FEM (TUBING) ×1 IMPLANT

## 2016-02-10 NOTE — Progress Notes (Signed)
CARDIAC REHAB PHASE I   Pt just returned from cath lab. Pt on bedrest for 6 hours post cath. Will follow up tomorrow.   Joylene GrapesEmily C Wiletta Bermingham, RN, BSN 02/10/2016 1:05 PM

## 2016-02-10 NOTE — Progress Notes (Signed)
Patient ID: Tristan Baker, male   DOB: [birth year], 75 y.o.   MRN: [id number]    Advanced Heart Failure Rounding Note   Subjective:    PICC placed on admit. Initial co-ox 43.8%. Started on milrinone which has been increased to 0.375.    Today, co-ox up improved at 68.5% on milrinone 0.375    Creatinine remains elevated and up slightly overnight.  2.4->2.55->2.63->2.58 ->2.59 -> 2.65, baseline about 1.7.   Weight actually up 10 oz with IV lasix and metolazone yesterday am and po lasix 40 mg BID in the evening.   Feeling OK this morning. Has been walking around with Cardiac Rehab. Improving, walked 1.5 laps without rest.   Short runs NSVT.   Objective:   Weight Range:  Vital Signs:   Temp:  [97.9 F (36.6 C)-98.9 F (37.2 C)] 98.9 F (37.2 C) (05/31 0332) Pulse Rate:  [96-105] 96 (05/31 0400) Resp:  [14-23] 22 (05/31 0400) BP: (103-134)/(64-92) 118/64 mmHg (05/31 0332) SpO2:  [90 %-96 %] 94 % (05/31 0400) Weight:  [184 lb 11.2 oz (83.779 kg)] 184 lb 11.2 oz (83.779 kg) (05/31 0500) Last BM Date: 02/08/16  Weight change: Filed Weights   02/08/16 0328 02/09/16 0500 02/10/16 0500  Weight: 189 lb 8 oz (85.957 kg) 184 lb 1.6 oz (83.507 kg) 184 lb 11.2 oz (83.779 kg)    Intake/Output:   Intake/Output Summary (Last 24 hours) at 02/10/16 0751 Last data filed at 02/10/16 0700  Gross per 24 hour  Intake 1572.8 ml  Output   2750 ml  Net -1177.2 ml     Physical Exam:  CVP: 9 GEN: Reclined in chair. NAD HEENT: normal  Neck: JVP 8-9 cm, no masses Cardiac: PMI laterally displaced. Normal S1/S2, regular  + s3 distant HS. 2/6 systolic murmur RUSB. Trace ankle edema.  Respiratory: CTAB, normal effort  GI: mildly distended, NT, no HSM. No bruits or masses. +BS  Skin: warm and dry Neuro: No focal deficits  Psych: Alert and oriented x 3, Normal affect  Telemetry: NSR 90-100s, Occasional short runs NSVT  Labs: Basic Metabolic Panel:  Recent Labs Lab 02/05/16 1429  02/06/16 0555 02/07/16 0534 02/08/16 0526 02/09/16 0515 02/10/16 0400  NA 136 137 136 133* 133* 131*  K 4.7 4.4 4.1 3.9 3.8 4.0  CL 104 101 98* 93* 89* 88*  CO2 25 26 29 31  32 33*  GLUCOSE 182* 174* 180* 205* 192* 243*  BUN 37* 40* 42* 44* 43* 41*  CREATININE 2.40* 2.55* 2.63* 2.58* 2.59* 2.65*  CALCIUM 9.0 9.1 9.1 9.3 9.5 9.1  MG 2.0  --   --   --  1.9  --     Liver Function Tests:  Recent Labs Lab 02/05/16 1429  AST 30  ALT 16*  ALKPHOS 61  BILITOT 1.0  PROT 6.8  ALBUMIN 3.5   No results for input(s): LIPASE, AMYLASE in the last 168 hours. No results for input(s): AMMONIA in the last 168 hours.  CBC:  Recent Labs Lab 02/05/16 1429 02/09/16 0515  WBC 5.8 8.4  NEUTROABS 4.0  --   HGB 12.0* 12.2*  HCT 39.0 38.2*  MCV 96.3 91.6  PLT 187 218    Cardiac Enzymes:  Recent Labs Lab 02/05/16 1429 02/05/16 2130 02/06/16 0120  TROPONINI 0.13* 0.14* 0.14*    BNP: BNP (last 3 results)  Recent Labs  01/05/16 1525 01/08/16 1354 02/05/16 1429  BNP 1597.7* 1319.2* 2128.4*    ProBNP (last 3 results) No results for input(s): PROBNP  in the last 8760 hours.    Other results:  Imaging: No results found.   Medications:     Scheduled Medications: . amiodarone  200 mg Oral BID  . antiseptic oral rinse  7 mL Mouth Rinse BID  . aspirin EC  81 mg Oral Daily  . atorvastatin  40 mg Oral QHS  . enoxaparin (LOVENOX) injection  30 mg Subcutaneous Q24H  . furosemide  40 mg Oral BID  . gabapentin  300 mg Oral QHS  . hydrALAZINE  25 mg Oral Q8H  . insulin aspart  0-15 Units Subcutaneous TID WC  . insulin aspart  0-5 Units Subcutaneous QHS  . insulin detemir  10 Units Subcutaneous Daily  . isosorbide mononitrate  30 mg Oral Daily  . magnesium oxide  400 mg Oral Daily  . multivitamin with minerals  1 tablet Oral Daily  . potassium chloride  20 mEq Oral Daily  . sertraline  100 mg Oral QHS  . sodium chloride flush  10-40 mL Intracatheter Q12H  . sodium  chloride flush  3 mL Intravenous Q12H  . sodium chloride flush  3 mL Intravenous Q12H  . spironolactone  12.5 mg Oral Daily    Infusions: . sodium chloride 10 mL/hr at 02/10/16 0612  . milrinone 0.375 mcg/kg/min (02/10/16 0604)    PRN Medications: sodium chloride, sodium chloride, acetaminophen, nitroGLYCERIN, ondansetron (ZOFRAN) IV, sodium chloride flush, sodium chloride flush, sodium chloride flush, traMADol   Assessment/Plan:   1. Acute on chronic systolic HF with 1/615/17 echo => EF 25% with moderately dilated LV, mildly dilated RV with moderate to severely decreased RV systolic function, heavily calcified aortic valve with mild to moderate stenosis. This is considerably worse than his prior echo. He has severe biventricular failure. On admission, was markedly volume overloaded on exam. 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  Initial co-ox 44%. Now on milrinone 0.375. Co-ox stable at 68.5% on increased milrinone. - Decrease milrinone to 0.25 mcg/kg/min.  Concerned that weaning is going to be difficult. - Diuresed well and now on po meds, though CVP still up slightly. Will give torsemide 40 mg po daily for now.  - Off Toprol XL with low output. No ACE/ARB with renal failure. He is now on hydralazine/Imdur at low dose, increase hydralazine to 37.5 mg tid with Imdur 50 mg daily as we try to start weaning milrinone. - Continue spironolactone 12.5 daily. - RHC this morning.  - Have discussed situation with wife and patient: Not LVAD candidate with renal dysfunction and now suspect end-stage CHF.  - Concerned that we will not be able to get him off milrinone.  If needed, he is ok with going home on it.  If renal function worsens, would consider palliative care.   2. CAD: s/p CABG with RIMA-RCA, LIMA-LAD, and seq SVG-OM1/OM2. He had DES to SVG-OM1/OM2 in 2014. No chest pain. ? If the significant fall in EF on most recent echo may be related to progressive  coronary disease, no ACS this admission.  - Continue ASA 81, statin.  - Doubt renal function will permit coronary angiography.  3. Aortic stenosis: The aortic valve is heavily calcified but opens. Likely mild to possibly moderate aortic stenosis (opens reasonably in the setting of significantly decreased EF) => do not think severe.  4. DM2 - continue SSI 5. Acute on chronic renal failure, stage 4: Likely cardiorenal.  6. NSVT: Continues to have occasional runs on higher milrinone.   - Continue  amiodarone 200 mg bid.  - Magnesium 1.9.  Started on Mg 400 mg daily.   Length of Stay: 5  Graciella Freer PA-C 02/10/2016, 7:51 AM   Advanced Heart Failure Team Pager [phone] (M-F; 7a - 4p)  Please contact [facility] for night-coverage after hours (4p -7a ) and weekends on [email or web address]  Patient seen with PA, agree with the above note.  Renal function fairly stable. Still on milrinone 0.375.  CVP 8-9 again this morning.  - Wean milrinone to 0.25 => as above, concerned that he may require home milrinone.  Not great LVAD candidate with renal function, will need to consider, however, if he remains on milrinone and renal function improves over time.  - Plan for RHC today.  - Titrate up hydralazine/Imdur.  - Will use torsemide for diuretic for now, may change back to IV if significant volume overload still on RHC today.   Marca Ancona 02/10/2016 8:14 AM

## 2016-02-10 NOTE — Progress Notes (Addendum)
Physical Therapy Treatment Patient Details Name: Tristan SkainsFrank Baker MRN: [id number] DOB: 10/16/1940 Today's Date: 02/10/2016    History of Present Illness Tristan SkainsFrank Baker is a 75 y.o. male with a hx of CAD s/p CABG in 1996 and subsequent PCI with DES to S-OM in 2014, ICM with prior EF 45% by echo in 2014, RBBB, DM, HTN, HL, prior DVT, CKD 3. Admitted with  pulmonary edema and small bilateral pleural effusions, SOB and LE swelling. Cardiac cath 5/31    PT Comments    Ambulation more steady today. As began Dynamic Gait Index testing for balance, RN reported pt needed to return to his room as they were coming to take patient to cath lab. HR 93-118 during ambulation with SaO2 92-94% on 2L O2.    Follow Up Recommendations  No PT follow up;Supervision - Intermittent     Equipment Recommendations  None recommended by PT    Recommendations for Other Services       Precautions / Restrictions Precautions Precautions: Other (comment) Precaution Comments: HOH, mild SOB with activity, EF 25-30% Restrictions Weight Bearing Restrictions: No    Mobility  Bed Mobility Overal bed mobility: Modified Independent             General bed mobility comments: pt up in chair; returned to supine without assist  Transfers Overall transfer level: Needs assistance Equipment used: None Transfers: Sit to/from Stand Sit to Stand: Min guard         General transfer comment: pt uses armrests and braces self against chair with back of the legs  Ambulation/Gait Ambulation/Gait assistance: Min guard Ambulation Distance (Feet): 180 Feet Assistive device: None Gait Pattern/deviations: Step-through pattern;Decreased stride length;Wide base of support Gait velocity: decreased   General Gait Details: holding wife's hand, however not using for support; steady; minimal ability to incr velocity with cues   Stairs            Wheelchair Mobility    Modified Rankin (Stroke Patients Only)        Balance Overall balance assessment: Needs assistance         Standing balance support: No upper extremity supported Standing balance-Leahy Scale: Fair               High level balance activites: Turns;Sudden stops High Level Balance Comments: no imbalance noted    Cognition Arousal/Alertness: Awake/alert Behavior During Therapy: WFL for tasks assessed/performed Overall Cognitive Status: Within Functional Limits for tasks assessed                      Exercises General Exercises - Lower Extremity Ankle Circles/Pumps: AROM;Both;10 reps;Seated Heel Raises: AROM;Both;10 reps;Seated (pressing toes into floor)    General Comments General comments (skin integrity, edema, etc.): wife present and reports ~6 mos ago was more unsteady/problems with lightheadedness upon standing      Pertinent Vitals/Pain Pain Assessment: No/denies pain    Home Living                      Prior Function            PT Goals (current goals can now be found in the care plan section) Acute Rehab PT Goals Patient Stated Goal: home Time For Goal Achievement: 02/15/16 Progress towards PT goals: Progressing toward goals    Frequency  Min 2X/week    PT Plan Current plan remains appropriate    Co-evaluation             End  of Session Equipment Utilized During Treatment: Gait belt;Oxygen Activity Tolerance: Patient tolerated treatment well Patient left: in bed;with family/visitor present;with nursing/sitter in room (RN preparing pt to go to cath lab)     Time: 1610-9604 PT Time Calculation (min) (ACUTE ONLY): 19 min  Charges:  $Gait Training: 8-22 mins                    G Codes:      Tristan Baker 21-Feb-2016, 10:41 AM Pager [phone]

## 2016-02-10 NOTE — Interval H&P Note (Signed)
History and Physical Interval Note:  02/10/2016 11:18 AM  Tristan Baker  has presented today for surgery, with the diagnosis of hf  The various methods of treatment have been discussed with the patient and family. After consideration of risks, benefits and other options for treatment, the patient has consented to  Procedure(s): Right Heart Cath (N/A) as a surgical intervention .  The patient's history has been reviewed, patient examined, no change in status, stable for surgery.  I have reviewed the patient's chart and labs.  Questions were answered to the patient's satisfaction.     Dalton [employer] Energy[employer]Lean

## 2016-02-10 NOTE — Progress Notes (Signed)
3F sheath pulled from R femoral vein by Adalberto IllAaron Jonai Weyland. 10 minutes manual pressure applied. Site level 0 with no hematoma noted. Site dressed with tegaderm and 4x4. Instructions given to pt. Bedrest begins at 1225. DP +2.

## 2016-02-10 NOTE — H&P (View-Only) (Signed)
Patient ID: Tristan Baker, male   DOB: 01/23/1941, 75 y.o.   MRN: [id number]    Advanced Heart Failure Rounding Note   Subjective:    PICC placed on admit. Initial co-ox 43.8%. Started on milrinone which has been increased to 0.375.    Today, co-ox up improved at 68.5% on milrinone 0.375    Creatinine remains elevated and up slightly overnight.  2.4->2.55->2.63->2.58 ->2.59 -> 2.65, baseline about 1.7.   Weight actually up 10 oz with IV lasix and metolazone yesterday am and po lasix 40 mg BID in the evening.   Feeling OK this morning. Has been walking around with Cardiac Rehab. Improving, walked 1.5 laps without rest.   Short runs NSVT.   Objective:   Weight Range:  Vital Signs:   Temp:  [97.9 F (36.6 C)-98.9 F (37.2 C)] 98.9 F (37.2 C) (05/31 0332) Pulse Rate:  [96-105] 96 (05/31 0400) Resp:  [14-23] 22 (05/31 0400) BP: (103-134)/(64-92) 118/64 mmHg (05/31 0332) SpO2:  [90 %-96 %] 94 % (05/31 0400) Weight:  [184 lb 11.2 oz (83.779 kg)] 184 lb 11.2 oz (83.779 kg) (05/31 0500) Last BM Date: 02/08/16  Weight change: Filed Weights   02/08/16 0328 02/09/16 0500 02/10/16 0500  Weight: 189 lb 8 oz (85.957 kg) 184 lb 1.6 oz (83.507 kg) 184 lb 11.2 oz (83.779 kg)    Intake/Output:   Intake/Output Summary (Last 24 hours) at 02/10/16 0751 Last data filed at 02/10/16 0700  Gross per 24 hour  Intake 1572.8 ml  Output   2750 ml  Net -1177.2 ml     Physical Exam:  CVP: 9 GEN: Reclined in chair. NAD HEENT: normal  Neck: JVP 8-9 cm, no masses Cardiac: PMI laterally displaced. Normal S1/S2, regular  + s3 distant HS. 2/6 systolic murmur RUSB. Trace ankle edema.  Respiratory: CTAB, normal effort  GI: mildly distended, NT, no HSM. No bruits or masses. +BS  Skin: warm and dry Neuro: No focal deficits  Psych: Alert and oriented x 3, Normal affect  Telemetry: NSR 90-100s, Occasional short runs NSVT  Labs: Basic Metabolic Panel:  Recent Labs Lab 02/05/16 1429  02/06/16 0555 02/07/16 0534 02/08/16 0526 02/09/16 0515 02/10/16 0400  NA 136 137 136 133* 133* 131*  K 4.7 4.4 4.1 3.9 3.8 4.0  CL 104 101 98* 93* 89* 88*  CO2 25 26 29 31 32 33*  GLUCOSE 182* 174* 180* 205* 192* 243*  BUN 37* 40* 42* 44* 43* 41*  CREATININE 2.40* 2.55* 2.63* 2.58* 2.59* 2.65*  CALCIUM 9.0 9.1 9.1 9.3 9.5 9.1  MG 2.0  --   --   --  1.9  --     Liver Function Tests:  Recent Labs Lab 02/05/16 1429  AST 30  ALT 16*  ALKPHOS 61  BILITOT 1.0  PROT 6.8  ALBUMIN 3.5   No results for input(s): LIPASE, AMYLASE in the last 168 hours. No results for input(s): AMMONIA in the last 168 hours.  CBC:  Recent Labs Lab 02/05/16 1429 02/09/16 0515  WBC 5.8 8.4  NEUTROABS 4.0  --   HGB 12.0* 12.2*  HCT 39.0 38.2*  MCV 96.3 91.6  PLT 187 218    Cardiac Enzymes:  Recent Labs Lab 02/05/16 1429 02/05/16 2130 02/06/16 0120  TROPONINI 0.13* 0.14* 0.14*    BNP: BNP (last 3 results)  Recent Labs  01/05/16 1525 01/08/16 1354 02/05/16 1429  BNP 1597.7* 1319.2* 2128.4*    ProBNP (last 3 results) No results for input(s): PROBNP   in the last 8760 hours.    Other results:  Imaging: No results found.   Medications:     Scheduled Medications: . amiodarone  200 mg Oral BID  . antiseptic oral rinse  7 mL Mouth Rinse BID  . aspirin EC  81 mg Oral Daily  . atorvastatin  40 mg Oral QHS  . enoxaparin (LOVENOX) injection  30 mg Subcutaneous Q24H  . furosemide  40 mg Oral BID  . gabapentin  300 mg Oral QHS  . hydrALAZINE  25 mg Oral Q8H  . insulin aspart  0-15 Units Subcutaneous TID WC  . insulin aspart  0-5 Units Subcutaneous QHS  . insulin detemir  10 Units Subcutaneous Daily  . isosorbide mononitrate  30 mg Oral Daily  . magnesium oxide  400 mg Oral Daily  . multivitamin with minerals  1 tablet Oral Daily  . potassium chloride  20 mEq Oral Daily  . sertraline  100 mg Oral QHS  . sodium chloride flush  10-40 mL Intracatheter Q12H  . sodium  chloride flush  3 mL Intravenous Q12H  . sodium chloride flush  3 mL Intravenous Q12H  . spironolactone  12.5 mg Oral Daily    Infusions: . sodium chloride 10 mL/hr at 02/10/16 0612  . milrinone 0.375 mcg/kg/min (02/10/16 0604)    PRN Medications: sodium chloride, sodium chloride, acetaminophen, nitroGLYCERIN, ondansetron (ZOFRAN) IV, sodium chloride flush, sodium chloride flush, sodium chloride flush, traMADol   Assessment/Plan:   1. Acute on chronic systolic HF with 5/17 echo => EF 25% with moderately dilated LV, mildly dilated RV with moderate to severely decreased RV systolic function, heavily calcified aortic valve with mild to moderate stenosis. This is considerably worse than his prior echo. He has severe biventricular failure. On admission, was markedly volume overloaded on exam. 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  Initial co-ox 44%. Now on milrinone 0.375. Co-ox stable at 68.5% on increased milrinone. - Decrease milrinone to 0.25 mcg/kg/min.  Concerned that weaning is going to be difficult. - Diuresed well and now on po meds, though CVP still up slightly. Will give torsemide 40 mg po daily for now.  - Off Toprol XL with low output. No ACE/ARB with renal failure. He is now on hydralazine/Imdur at low dose, increase hydralazine to 37.5 mg tid with Imdur 50 mg daily as we try to start weaning milrinone. - Continue spironolactone 12.5 daily. - RHC this morning.  - Have discussed situation with wife and patient: Not LVAD candidate with renal dysfunction and now suspect end-stage CHF.  - Concerned that we will not be able to get him off milrinone.  If needed, he is ok with going home on it.  If renal function worsens, would consider palliative care.   2. CAD: s/p CABG with RIMA-RCA, LIMA-LAD, and seq SVG-OM1/OM2. He had DES to SVG-OM1/OM2 in 2014. No chest pain. ? If the significant fall in EF on most recent echo may be related to progressive  coronary disease, no ACS this admission.  - Continue ASA 81, statin.  - Doubt renal function will permit coronary angiography.  3. Aortic stenosis: The aortic valve is heavily calcified but opens. Likely mild to possibly moderate aortic stenosis (opens reasonably in the setting of significantly decreased EF) => do not think severe.  4. DM2 - continue SSI 5. Acute on chronic renal failure, stage 4: Likely cardiorenal.  6. NSVT: Continues to have occasional runs on higher milrinone.   - Continue   amiodarone 200 mg bid.  - Magnesium 1.9.  Started on Mg 400 mg daily.   Length of Stay: 5  Michael Andrew Tillery PA-C 02/10/2016, 7:51 AM   Advanced Heart Failure Team Pager [phone] (M-F; 7a - 4p)  Please contact [facility] for night-coverage after hours (4p -7a ) and weekends on [email or web address]  Patient seen with PA, agree with the above note.  Renal function fairly stable. Still on milrinone 0.375.  CVP 8-9 again this morning.  - Wean milrinone to 0.25 => as above, concerned that he may require home milrinone.  Not great LVAD candidate with renal function, will need to consider, however, if he remains on milrinone and renal function improves over time.  - Plan for RHC today.  - Titrate up hydralazine/Imdur.  - Will use torsemide for diuretic for now, may change back to IV if significant volume overload still on RHC today.   Telsa Dillavou 02/10/2016 8:14 AM     

## 2016-02-11 ENCOUNTER — Encounter: Payer: Self-pay | Admitting: Physician Assistant

## 2016-02-11 LAB — GLUCOSE, CAPILLARY
GLUCOSE-CAPILLARY: 244 mg/dL — AB (ref 65–99)
GLUCOSE-CAPILLARY: 242 mg/dL — AB (ref 65–99)
Glucose-Capillary: 218 mg/dL — ABNORMAL HIGH (ref 65–99)

## 2016-02-11 LAB — CBC
HCT: 37.4 % — ABNORMAL LOW (ref 39.0–52.0)
HEMOGLOBIN: 11.8 g/dL — AB (ref 13.0–17.0)
MCH: 29.1 pg (ref 26.0–34.0)
MCHC: 31.6 g/dL (ref 30.0–36.0)
MCV: 92.1 fL (ref 78.0–100.0)
Platelets: 210 10*3/uL (ref 150–400)
RBC: 4.06 MIL/uL — ABNORMAL LOW (ref 4.22–5.81)
RDW: 13.9 % (ref 11.5–15.5)
WBC: 6.9 10*3/uL (ref 4.0–10.5)

## 2016-02-11 LAB — BASIC METABOLIC PANEL
ANION GAP: 9 (ref 5–15)
BUN: 41 mg/dL — ABNORMAL HIGH (ref 6–20)
CHLORIDE: 91 mmol/L — AB (ref 101–111)
CO2: 33 mmol/L — ABNORMAL HIGH (ref 22–32)
Calcium: 9.3 mg/dL (ref 8.9–10.3)
Creatinine, Ser: 2.49 mg/dL — ABNORMAL HIGH (ref 0.61–1.24)
GFR calc Af Amer: 28 mL/min — ABNORMAL LOW (ref >60)
GFR, EST NON AFRICAN AMERICAN: 24 mL/min — AB (ref >60)
GLUCOSE: 282 mg/dL — AB (ref 65–99)
POTASSIUM: 4.1 mmol/L (ref 3.5–5.1)
Sodium: 133 mmol/L — ABNORMAL LOW (ref 135–145)

## 2016-02-11 LAB — CARBOXYHEMOGLOBIN
CARBOXYHEMOGLOBIN: 2 % — AB (ref 0.5–1.5)
CARBOXYHEMOGLOBIN: 1.7 % — AB (ref 0.5–1.5)
METHEMOGLOBIN: 0.8 % (ref 0.0–1.5)
Methemoglobin: 0.7 % (ref 0.0–1.5)
O2 SAT: 76.6 %
O2 Saturation: 88.1 %
TOTAL HEMOGLOBIN: 12.2 g/dL — AB (ref 13.5–18.0)
TOTAL HEMOGLOBIN: 13 g/dL — AB (ref 13.5–18.0)

## 2016-02-11 MED ORDER — AMIODARONE HCL 200 MG PO TABS: ORAL_TABLET | ORAL | Status: DC

## 2016-02-11 MED ORDER — POTASSIUM CHLORIDE CRYS ER 20 MEQ PO TBCR: 20.0000 meq | EXTENDED_RELEASE_TABLET | Freq: Every day | ORAL | Status: DC

## 2016-02-11 MED ORDER — INSULIN DETEMIR 100 UNIT/ML ~~LOC~~ SOLN: 30.0000 [IU] | Freq: Every day | SUBCUTANEOUS | Status: DC

## 2016-02-11 MED ORDER — SPIRONOLACTONE 25 MG PO TABS: 12.5000 mg | ORAL_TABLET | Freq: Every day | ORAL | Status: DC

## 2016-02-11 MED ORDER — TRAMADOL HCL 50 MG PO TABS: 50.0000 mg | ORAL_TABLET | Freq: Two times a day (BID) | ORAL | Status: AC | PRN

## 2016-02-11 MED ORDER — TORSEMIDE 20 MG PO TABS: 40.0000 mg | ORAL_TABLET | Freq: Every day | ORAL | Status: AC

## 2016-02-11 MED ORDER — MILRINONE LACTATE IN DEXTROSE 20-5 MG/100ML-% IV SOLN: 0.2500 ug/kg/min | INTRAVENOUS | Status: AC

## 2016-02-11 MED ORDER — HYDRALAZINE HCL 25 MG PO TABS: 37.5000 mg | ORAL_TABLET | Freq: Three times a day (TID) | ORAL | Status: DC

## 2016-02-11 MED ORDER — ISOSORBIDE MONONITRATE ER 60 MG PO TB24: 60.0000 mg | ORAL_TABLET | Freq: Every day | ORAL | Status: DC

## 2016-02-11 MED FILL — Heparin Sodium (Porcine) 2 Unit/ML in Sodium Chloride 0.9%: INTRAMUSCULAR | Qty: 500 | Status: AC

## 2016-02-11 NOTE — Progress Notes (Signed)
Patient ID: Corliss SkainsFrank Deltoro, male   DOB: 11/03/1940, 75 y.o.   MRN: [id number]    Advanced Heart Failure Rounding Note   Subjective:    PICC placed on admit. Initial co-ox 43.8%. Started on milrinone which was increased to 0.375.  Milrinone weaned to 0.25 and RHC done yesterday.  Cardiac index was marginal on milrinone 0.25 so he was not weaned further.  He feels good this morning.  I/Os negative yesterday on po torsemide and weight stable.    Creatinine remains elevated and up slightly overnight.  2.4->2.55->2.63->2.58 ->2.59 -> 2.6 -> 2.49, baseline about 1.7.   RHC Procedural Findings (on milrinone 0.25): Hemodynamics (mmHg) RA mean 6 RV 37/7 PA 41/15, mean 21 PCWP mean 9 Oxygen saturations: PA 52% AO 89% Cardiac Output (Fick) 4.25  Cardiac Index (Fick) 2.17 Cardiac Output (Thermo) 3.82 Cardiac Index (Thermo) 1.95  Objective:   Weight Range:  Vital Signs:   Temp:  [97.5 F (36.4 C)-99 F (37.2 C)] 98.2 F (36.8 C) (06/01 0352) Pulse Rate:  [75-117] 103 (06/01 0352) Resp:  [10-26] 16 (06/01 0352) BP: (94-144)/(43-90) 124/79 mmHg (06/01 0543) SpO2:  [88 %-99 %] 91 % (06/01 0352) Weight:  [184 lb 4.8 oz (83.598 kg)] 184 lb 4.8 oz (83.598 kg) (06/01 0352) Last BM Date: 02/10/16  Weight change: Filed Weights   02/09/16 0500 02/10/16 0500 02/11/16 0352  Weight: 184 lb 1.6 oz (83.507 kg) 184 lb 11.2 oz (83.779 kg) 184 lb 4.8 oz (83.598 kg)    Intake/Output:   Intake/Output Summary (Last 24 hours) at 02/11/16 0727 Last data filed at 02/11/16 0600  Gross per 24 hour  Intake 842.24 ml  Output   2050 ml  Net -1207.76 ml     Physical Exam:  GEN: Reclined in chair. NAD HEENT: normal  Neck: JVP 8 cm, no masses Cardiac: PMI laterally displaced. Normal S1/S2, regular, no S3 distant HS. 2/6 systolic murmur RUSB. No edema.  Respiratory: CTAB, normal effort  GI: mildly distended, NT, no HSM. No bruits or masses. +BS  Skin: warm and dry Neuro: No focal deficits   Psych: Alert and oriented x 3, Normal affect  Telemetry: NSR 90-100s, PVCs  Labs: Basic Metabolic Panel:  Recent Labs Lab 02/05/16 1429  02/07/16 0534 02/08/16 0526 02/09/16 0515 02/10/16 0400 02/11/16 0500  NA 136  < > 136 133* 133* 131* 133*  K 4.7  < > 4.1 3.9 3.8 4.0 4.1  CL 104  < > 98* 93* 89* 88* 91*  CO2 25  < > 29 31 32 33* 33*  GLUCOSE 182*  < > 180* 205* 192* 243* 282*  BUN 37*  < > 42* 44* 43* 41* 41*  CREATININE 2.40*  < > 2.63* 2.58* 2.59* 2.65* 2.49*  CALCIUM 9.0  < > 9.1 9.3 9.5 9.1 9.3  MG 2.0  --   --   --  1.9  --   --   < > = values in this interval not displayed.  Liver Function Tests:  Recent Labs Lab 02/05/16 1429  AST 30  ALT 16*  ALKPHOS 61  BILITOT 1.0  PROT 6.8  ALBUMIN 3.5   No results for input(s): LIPASE, AMYLASE in the last 168 hours. No results for input(s): AMMONIA in the last 168 hours.  CBC:  Recent Labs Lab 02/05/16 1429 02/09/16 0515 02/11/16 0500  WBC 5.8 8.4 6.9  NEUTROABS 4.0  --   --   HGB 12.0* 12.2* 11.8*  HCT 39.0 38.2* 37.4*  MCV 96.3 91.6 92.1  PLT 187 218 210    Cardiac Enzymes:  Recent Labs Lab 02/05/16 1429 02/05/16 2130 02/06/16 0120  TROPONINI 0.13* 0.14* 0.14*    BNP: BNP (last 3 results)  Recent Labs  01/05/16 1525 01/08/16 1354 02/05/16 1429  BNP 1597.7* 1319.2* 2128.4*    ProBNP (last 3 results) No results for input(s): PROBNP in the last 8760 hours.    Other results:  Imaging: No results found.   Medications:     Scheduled Medications: . amiodarone  200 mg Oral BID  . antiseptic oral rinse  7 mL Mouth Rinse BID  . aspirin EC  81 mg Oral Daily  . atorvastatin  40 mg Oral QHS  . gabapentin  300 mg Oral QHS  . heparin  5,000 Units Subcutaneous Q8H  . hydrALAZINE  37.5 mg Oral Q8H  . insulin aspart  0-15 Units Subcutaneous TID WC  . insulin aspart  0-5 Units Subcutaneous QHS  . insulin detemir  10 Units Subcutaneous Daily  . isosorbide mononitrate  60 mg Oral  Daily  . magnesium oxide  400 mg Oral Daily  . multivitamin with minerals  1 tablet Oral Daily  . potassium chloride  20 mEq Oral Daily  . sertraline  100 mg Oral QHS  . sodium chloride flush  10-40 mL Intracatheter Q12H  . sodium chloride flush  3 mL Intravenous Q12H  . sodium chloride flush  3 mL Intravenous Q12H  . spironolactone  12.5 mg Oral Daily  . torsemide  40 mg Oral Daily    Infusions: . milrinone 0.25 mcg/kg/min (02/10/16 1900)    PRN Medications: sodium chloride, sodium chloride, acetaminophen, nitroGLYCERIN, ondansetron (ZOFRAN) IV, sodium chloride flush, sodium chloride flush, sodium chloride flush, traMADol   Assessment/Plan:   1. Acute on chronic systolic HF with 2/13 echo => EF 25% with moderately dilated LV, mildly dilated RV with moderate to severely decreased RV systolic function, heavily calcified aortic valve with mild to moderate stenosis. This is considerably worse than his prior echo. He has severe biventricular failure. On admission, was markedly volume overloaded on exam. 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  Initial co-ox 44%. Milrinone was started and increased to 0.375, then weaned to 0.25.  Cardiac output on RHC 5/31 was somewhat marginal with milrinone 0.25.  Filling pressures were optimized on RHC.  - I do not think we can wean him further at this point.  Think he will need to go home on milrinone 0.25.  I do not think co-ox is accurate this morning, will repeat.  - Diuresed well and now on po torsemide with I/Os negative and weight stable.   - Off Toprol XL with low output. No ACE/ARB with renal failure. He is now on hydralazine/Imdur.  - Continue spironolactone 12.5 daily. - He does not have a defibrillator but would not be a candidate at this point with end stage CHF going home on milrinone.  - Have discussed situation with wife and patient: Not LVAD candidate with renal dysfunction and now suspect end-stage CHF.   He is going to go home with milrinone.  They understand that it can make him feel better but will not make him live longer.  I will try to slowly taper off milrinone over time.  If his renal function improves, he may end up being an LVAD candidate.  Will follow this over the next few weeks.  2. CAD: s/p CABG with RIMA-RCA, LIMA-LAD, and  seq SVG-OM1/OM2. He had DES to SVG-OM1/OM2 in 2014. No chest pain. ? If the significant fall in EF on most recent echo may be related to progressive coronary disease, no ACS this admission.  - Continue ASA 81, statin.  - Renal function does not permit coronary angiography at this point in the absence of ACS.  3. Aortic stenosis: The aortic valve is heavily calcified but opens. Likely mild to possibly moderate aortic stenosis (opens reasonably in the setting of significantly decreased EF) => do not think severe.  4. DM2 - continue SSI 5. Acute on chronic renal failure, stage 4: Likely cardiorenal.  6. NSVT: Has had NSVT and PVCs.     - Continue amiodarone 200 mg bid x 1 week then down to 200 mg daily.  Needs to be on this with ventricular ectopy and milrinone use.  - Magnesium 1.9.  Started on Mg 400 mg daily.  7. Disposition: Patient may go home today with home health.  He will need close followup with me in 1 week to see if we can titrate milrinone.  Home meds: Milrinone 0.25, torsemide 40 daily, KCl 20 daily, spironolactone 12.5 daily, hydralazine 37.5 tid + Imdur 60 daily, ASA 81, atorvastatin 40 daily, magnesium oxide 200 mg daily.   Length of Stay: 6  Marca Ancona  02/11/2016, 7:27 AM   Advanced Heart Failure Team Pager [phone] (M-F; 7a - 4p)  Please contact [facility] for night-coverage after hours (4p -7a ) and weekends on [email or web address]

## 2016-02-11 NOTE — Progress Notes (Signed)
CARDIAC REHAB PHASE I   PRE:  Rate/Rhythm: 95 SR PVCs  BP:  Supine:   Sitting: 117/75  Standing:    SaO2: 92%RA  MODE:  Ambulation: 300 ft   POST:  Rate/Rhythm: 107 ST PVCs  BP:  Supine:   Sitting: 121/81  Standing:    SaO2: 90-95%RA 1105-1145 Pt walked 300 ft on RA with gait belt use and asst x 1. Stated he tired more easily today than yesterday. To recliner after walk. Gave pt CHF booklet and discussed when to call MD with weight gain and signs/symptoms. Pt stated he weighs every day. Discussed watching sodium and left 2 low sodium diets. Discussed 2000 mg sodium restriction and 2L FR. Pt will need wife and daughter to re enforce ed. Left materials at pt's bedside.   Luetta Nuttingharlene Hester Forget, RN BSN  02/11/2016 11:43 AM

## 2016-02-11 NOTE — Consult Note (Signed)
   [facility]   02/11/2016  Tristan SkainsFrank Baker 06/21/1941 [id number]   Patient screened for potential [facility] Care Management services. Patient is eligible for [facility]. Patient  is a 75 y.o. male per chart - with a hx of CAD s/p CABG, , CKD 3 admitted with A/C systolic heart failure and volume overload. Electronic medical record reveals patient's discharge plan is a skilled nursing facility. [facility]. [facility] Care Management services not appropriate, at this time for post hospital community care management.  For questions please contact:   Charlesetta ShanksVictoria Dewight Catino, RN BSN CCM [facility] Liaison  [phone] business mobile phone Toll free office [phone]

## 2016-02-11 NOTE — Clinical Social Work Note (Signed)
Clinical Social Work Assessment  Patient Details  Name: Tristan Baker MRN: [id number] Date of Birth: 08/06/1941  Date of referral:  02/11/16               Reason for consult:  Facility Placement                Permission sought to share information with:  Facility Sport and exercise psychologist, Family Supports Permission granted to share information::  Yes, Verbal Permission Granted  Name::     [place]::  SNF admissions  Relationship::     Contact Information:     Housing/Transportation Living arrangements for the past 2 months:  [place] of Information:  Patient, Spouse Patient Interpreter Needed:  None Criminal Activity/Legal Involvement Pertinent to Current Situation/Hospitalization:  No - Comment as needed Significant Relationships:  None, Spouse Lives with:  Spouse Do you feel safe going back to the place where you live?  No Need for family participation in patient care:  Yes (Comment) (Patient requests to have his wife help with problem solving)  Care giving concerns:  Patient's wife feels patient needs to go to SNF before he can go home   Social Worker assessment / plan:  Patient is a 75 year old male who is married and lives with his wife.  Patient's wife states he has not been to SNF for rehab before, CSW explained to patient's wife what to expect and how insurance will pay for his stay.  Patient's wife stated he has had some confusion, but she has her own health care needs that need to be met first before he can return back home.  Patient's wife states that she does not want him to be at SNF very long, she is hoping only a couple of weeks.  Patient was informed of plan for SNF and also explained how insurance will pay for stay.  Patient does not really want to go, but he expressed he does not have much of choice right now.  Patient expressed understanding that SNF will just be for short term.  Patient and wife did not express any  other issues or concerns.  Employment status:  Retired Forensic scientist:  Medicare PT Recommendations:  [place] / Referral to community resources:  [place]  Patient/Family's Response to care:  Patient reluctant but in agreement to going to SNF.  Patient/Family's Understanding of and Emotional Response to Diagnosis, Current Treatment, and Prognosis:  Patient and family aware of current prognosis and treatment plan.  Emotional Assessment Appearance:  Appears stated age Attitude/Demeanor/Rapport:    Affect (typically observed):    Orientation:    Alcohol / Substance use:  Not Applicable Psych involvement (Current and /or in the community):  No (Comment)  Discharge Needs  Concerns to be addressed:  Lack of Support Readmission within the last 30 days:  No Current discharge risk:  Lack of support system Barriers to Discharge:  No Barriers Identified   Ross Ludwig, LCSWA 02/11/2016, 2:12 PM

## 2016-02-11 NOTE — Clinical Social Work Placement (Signed)
   CLINICAL SOCIAL WORK PLACEMENT  NOTE  Date:  02/11/2016  Patient Details  Name: Tristan SkainsFrank Pua MRN: [id number] Date of Birth: 10/20/1940  Clinical Social Work is seeking post-discharge placement for this patient at the Skilled  Nursing Facility level of care (*CSW will initial, date and re-position this form in  chart as items are completed):  Yes   Patient/family provided with [facility] Clinical Social Work Department's list of facilities offering this level of care within the geographic area requested by the patient (or if unable, by the patient's family).  Yes   Patient/family informed of their freedom to choose among providers that offer the needed level of care, that participate in Medicare, Medicaid or managed care program needed by the patient, have an available bed and are willing to accept the patient.  Yes   Patient/family informed of [facility]'s ownership interest in [facility] and [facility], as well as of the fact that they are under no obligation to receive care at these facilities.  PASRR submitted to EDS on 02/11/16     PASRR number received on 02/11/16     Existing PASRR number confirmed on       FL2 transmitted to all facilities in geographic area requested by pt/family on 02/11/16     FL2 transmitted to all facilities within larger geographic area on       Patient informed that his/her managed care company has contracts with or will negotiate with certain facilities, including the following:        Yes   Patient/family informed of bed offers received.  [facility]     Physician [facility] and [facility]    [facility]  Patient to be transferred to facility by [facility]     Patient family notified on 02/11/16 of transfer.  Name of family member notified:  Patient's wife [name]     PHYSICIAN Please sign FL2      Additional Comment:    _______________________________________________ Darleene CleaverAnterhaus, Lorenzo Pereyra R, LCSWA 02/11/2016, 2:18 PM

## 2016-02-11 NOTE — Discharge Summary (Signed)
Advanced Heart Failure Team  Discharge Summary   Patient ID: Tristan [facility] MRN: [id number], DOB/AGE: 1941-07-13 75 y.o. Admit date: 02/05/2016 D/C date:     02/11/2016   Primary Discharge Diagnoses:  1. A/C Systolic Heart Failure  On Milrinone 0.25 mcg. Provided by [facility]  2. CAD S/P CABG with RIMC-RCA, LIMA-LAD, and seq SVG-OM1/OM2. S/PDES to SVG-OMI/OM2 2014 3. Aortic Stenosis 4. DM2 5. A/C Renal Failure 6. NSVT  [facility] Course:  Tristan [facility] is a 75 y.o. male with a hx of CAD s/p CABG in 1996 and subsequent PCI with DES to S-OM in 2014, ICM with prior EF 45% by echo in 2014, RBBB, DM, HTN, HL, prior DVT, CKD 3 admitted with A/C systolic heart failure and volume overload. Placed on milrinone due to low mixed venous saturation. Once fully diuresed he RHC as noted below. We were unable to wean milrinone. Today he is being discharged to [facility] in stable condition. He will continue to be followed closely in the HF clinic. [facility] will provide IV milrinone. He will need daily weights, low salt diet, fluid restriction < 2 liters per day, and weekly cbc/bmet/mag.   1. Acute on chronic systolic HF with 1/02 echo => EF 25% with moderately dilated LV, mildly dilated RV with moderate to severely decreased RV systolic function, heavily calcified aortic valve with mild to moderate stenosis. This was  considerably worse than his prior echo. He has severe biventricular failure.  On admission, was markedly volume overloaded on exam. 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 Initial co-ox 44%. Milrinone was started and increased to 0.375, then weaned to 0.25. Cardiac output on RHC 5/31 was somewhat marginal with milrinone 0.25. Filling pressures were optimized on RHC.  Unable to wean milrinone but can try as an outpatient.  -Diuresed with IV lasix and transitioned to 40 mg torsemide. Overall diuresed 15 pounds.  -No bb with low output. No ACE/ARB with renal  failure. He is now on hydralazine/Imdur.  - Continue spironolactone 12.5 daily. - He does not have a defibrillator but would not be a candidate at this point with end stage CHF going home on milrinone.  - Have discussed situation with wife and patient: Not LVAD candidate with renal dysfunction and now suspect end-stage CHF. He is going to go home with milrinone. They understand that it can make him feel better but will not make him live longer. If his renal function improves, he may end up being an LVAD candidate.  2. CAD: s/p CABG with RIMA-RCA, LIMA-LAD, and seq SVG-OM1/OM2. He had DES to SVG-OM1/OM2 in 2014. No chest pain. ? If the significant fall in EF on most recent echo may be related to progressive coronary disease, no ACS this admission.  - Continue ASA 81, statin.  - Renal function does not permit coronary angiography at this point in the absence of ACS.  3. Aortic stenosis: The aortic valve is heavily calcified but opens. Likely mild to possibly moderate aortic stenosis (opens reasonably in the setting of significantly decreased EF) => do not think severe.  4. DM2 - Continue levemir 30 units and bed time. He will also need glucose monitored ac/hs with sliding scale coverage per SNF.  5. Acute on chronic renal failure, stage 4: Likely cardiorenal. Creatinine peaked at 2.63 but was down to 2.49 on  Discharge.  6. NSVT: Has had NSVT and PVCs.  - Continue amiodarone 200 mg bid x 1 week then down to 200  mg daily. Needs to be on this with ventricular ectopy and milrinone use.  - Continue Magnesium 200 mg daily.   RHC 02/10/2016 RA mean 6 RV 37/7 PA 41/15, mean 21 PCWP mean 9 Oxygen saturations: PA 52% AO 89% Cardiac Output (Fick) 4.25  Cardiac Index (Fick) 2.17 Cardiac Output (Thermo) 3.82 Cardiac Index (Thermo) 1.95  Discharge Weight: 184 pounds.  Discharge Vitals: Blood pressure 124/79, pulse 103, temperature 96.6 F (35.9 C), temperature source Axillary,  resp. rate 16, height  (1.702 m), weight 184 lb 4.8 oz (83.598 kg), SpO2 91 %.  Labs: Lab Results  Component Value Date   WBC 6.9 02/11/2016   HGB 11.8* 02/11/2016   HCT 37.4* 02/11/2016   MCV 92.1 02/11/2016   PLT 210 02/11/2016    Recent Labs Lab 02/05/16 1429  02/11/16 0500  NA 136  < > 133*  K 4.7  < > 4.1  CL 104  < > 91*  CO2 25  < > 33*  BUN 37*  < > 41*  CREATININE 2.40*  < > 2.49*  CALCIUM 9.0  < > 9.3  PROT 6.8  --   --   BILITOT 1.0  --   --   ALKPHOS 61  --   --   ALT 16*  --   --   AST 30  --   --   GLUCOSE 182*  < > 282*  < > = values in this interval not displayed. Lab Results  Component Value Date   CHOL 130 03/28/2014   HDL 28* 03/28/2014   LDLCALC 22 03/28/2014   TRIG 400* 03/28/2014   BNP (last 3 results)  Recent Labs  01/05/16 1525 01/08/16 1354 02/05/16 1429  BNP 1597.7* 1319.2* 2128.4*    ProBNP (last 3 results) No results for input(s): PROBNP in the last 8760 hours.   Diagnostic Studies/Procedures   No results found.  Discharge Medications     Medication List    STOP taking these medications        furosemide 40 MG tablet  Commonly known as:  LASIX     metoprolol succinate 25 MG 24 hr tablet  Commonly known as:  TOPROL XL      TAKE these medications        amiodarone 200 MG tablet  Commonly known as:  PACERONE  200 mg twice a x 7 days, then 200 mg daily     aspirin 81 MG tablet  Take 1 tablet (81 mg total) by mouth daily.     atorvastatin 40 MG tablet  Commonly known as:  LIPITOR  Take 1 tablet (40 mg total) by mouth daily.     gabapentin 300 MG capsule  Commonly known as:  NEURONTIN  Take 1 capsule (300 mg total) by mouth at bedtime.     hydrALAZINE 25 MG tablet  Commonly known as:  APRESOLINE  Take 1.5 tablets (37.5 mg total) by mouth every 8 (eight) hours.     insulin detemir 100 UNIT/ML injection  Commonly known as:  LEVEMIR  Inject 0.3 mLs (30 Units total) into the skin at bedtime.      isosorbide mononitrate 60 MG 24 hr tablet  Commonly known as:  IMDUR  Take 1 tablet (60 mg total) by mouth daily.     milrinone 20 MG/100 ML Soln infusion  Commonly known as:  PRIMACOR  Inject 22.575 mcg/min into the vein continuous.     multivitamin with minerals Tabs tablet  Take 1  tablet by mouth at bedtime.     nitroGLYCERIN 0.4 MG SL tablet  Commonly known as:  NITROSTAT  Place 1 tablet (0.4 mg total) under the tongue every 5 (five) minutes as needed for chest pain (up to 3 doses).     potassium chloride SA 20 MEQ tablet  Commonly known as:  K-DUR,KLOR-CON  Take 1 tablet (20 mEq total) by mouth daily.     sertraline 100 MG tablet  Commonly known as:  ZOLOFT  Take 50 mg by mouth at bedtime.     spironolactone 25 MG tablet  Commonly known as:  ALDACTONE  Take 0.5 tablets (12.5 mg total) by mouth daily.     torsemide 20 MG tablet  Commonly known as:  DEMADEX  Take 2 tablets (40 mg total) by mouth daily.     traMADol 50 MG tablet  Commonly known as:  ULTRAM  Take 1 tablet (50 mg total) by mouth every 12 (twelve) hours as needed for moderate pain.        Disposition   The patient will be discharged in stable condition to Skilled Nursing Facility with IV milrinone.  Discharge Instructions    Diet - low sodium heart healthy    Complete by:  As directed      Diet - low sodium heart healthy    Complete by:  As directed      Face-to-face encounter (required for Medicare/Medicaid patients)    Complete by:  As directed   I Annleigh Knueppel certify that this patient is under my care and that I, or a nurse practitioner or physician's assistant working with me, had a face-to-face encounter that meets the physician face-to-face encounter requirements with this patient on 02/11/2016. The encounter with the patient was in whole, or in part for the following medical condition(s) which is the primary reason for home health care (List medical condition): Home milrinone HF  The encounter with  the patient was in whole, or in part, for the following medical condition, which is the primary reason for home health care:  Heart Fialure  I certify that, based on my findings, the following services are medically necessary home health services:  Nursing  Reason for Medically Necessary Home Health Services:  Skilled Nursing- Changes in Medication/Medication Management  My clinical findings support the need for the above services:  Shortness of breath with activity  Further, I certify that my clinical findings support that this patient is homebound due to:  Ambulates short distances less than 300 feet     Heart Failure patients record your daily weight using the same scale at the same time of day    Complete by:  As directed      Heart Failure patients record your daily weight using the same scale at the same time of day    Complete by:  As directed      Heart failure home health orders    Complete by:  As directed   Heart Failure Follow-up Care:  Verify follow-up appointments per Patient Discharge Instructions. Confirm transportation arranged. Reconcile home medications with discharge medication list. Remove discontinued medications from use. Assist patient/caregiver to manage medications using pill box. Reinforce low sodium food selection Assessments: Vital signs and oxygen saturation at each visit. Assess home environment for safety concerns, caregiver support and availability of low-sodium foods. Consult Child psychotherapistocial Worker, PT/OT, Dietitian, and CNA based on assessments. Perform comprehensive cardiopulmonary assessment. Notify MD for any change in condition or weight gain of 3 pounds  in one day or 5 pounds in one week with symptoms. Daily Weights and Symptom Monitoring: Ensure patient has access to scales. Teach patient/caregiver to weigh daily before breakfast and after voiding using same scale and record.    Teach patient/caregiver to track weight and symptoms and when to notify  Provider. Activity: Develop individualized activity plan with patient/caregiver.  Home milrinone [facility].25 mcg [facility] for 12 months  Heart Failure Follow-up Care:  Advanced Heart Failure (AHF) Clinic at [phone]  Home Health Visits:  Set up telemonitoring equipment to monitor daily vital signs, weights and oxygen saturation  Obtain the following labs:  Basic Metabolic Panel  Lab frequency:  Weekly  Fax lab results to:  AHF Clinic at [phone]  Diet:  Low Sodium Heart Healthy  Fluid restrictions:  2000 mL Fluid  Skilled Nurse to notify MD of weight trends weekly for first 2 weeks. May fax or call:  AHF Clinic at [phone] (fax) or [phone]  Initiate Heart Failure Clinic Diuretic Protocol to be used by [facility] only ( to be ordered by Heart Failure Team Providers Only):  Yes     Increase activity slowly    Complete by:  As directed      Increase activity slowly    Complete by:  As directed           Follow-up Information    Follow up with Marca Ancona, MD On 02/18/2016.   Specialty:  Cardiology   Why:  at 9:30 Garage Code 0020   Contact information:   [address] [place] [address] [phone]         Duration of Discharge Encounter: Greater than 35 minutes   Signed, Yakub Lodes NP-C  02/11/2016, 12:17 PM

## 2016-02-11 NOTE — Progress Notes (Signed)
Tristan [facility] called [phone]. She is concerned about pt coming straight home. Pt's wife unable to lift. They are considering short erm snf. sw eric alerted that may need short term snf. Spoke w pt who also has concerns about dc back home. sw to speak w Tristan and will start short term snf search.

## 2016-02-11 NOTE — Progress Notes (Signed)
Pt discharged to [facility] via [facility]. Family called and voicemail left about transfer. Milrinone infusing through PICC line.

## 2016-02-11 NOTE — Progress Notes (Signed)
Results for Corliss SkainsGUZZETTI, Demarquez (MRN [id number]) as of 02/11/2016 10:55  Ref. Range 02/10/2016 07:57 02/10/2016 12:49 02/10/2016 16:47 02/10/2016 21:35 02/11/2016 08:30  Glucose-Capillary Latest Ref Range: 65-99 mg/dL 409191 (H) 811159 (H) 914366 (H) 234 (H) 218 (H)  Noted that CBGs continue to be greater than 180 mg/dl. Patient takes Levemir 46 units every HS at home as noted in the home meds list.  Recommend starting at least 1/2 of home dose. Increase Levemir to 20 units daily and continue Novolog SENSITIVE correction scale TID & HS. Will continue to monitor blood sugars while in the hospital. Smith MinceKendra Kendell Gammon RN BSN CDE

## 2016-02-11 NOTE — Progress Notes (Signed)
[facility]  Patient Status:   [facility]  [facility] the following [facility]: HHRN and [facility] pharmacy team for home Milrinone. [facility] will provide in hospital teaching regarding home Milrinone and our "We've Got Heart" HF Management Program. [facility] will follow with HF team to support DC when pt is ready.   If patient discharges after hours, please call [phone].   [facility] 02/11/2016, 6:50 AM

## 2016-02-11 NOTE — Care Management Important Message (Signed)
Important Message  Patient Details  Name: Corliss SkainsFrank Baranowski MRN: [id number] Date of Birth: 06/26/1941   Medicare Important Message Given:  Yes    Bernadette HoitShoffner, Candela Krul Coleman 02/11/2016, 10:22 AM

## 2016-02-11 NOTE — NC FL2 (Signed)
[place] MEDICAID FL2 LEVEL OF CARE SCREENING TOOL     IDENTIFICATION  Patient Name: [facility] Birthdate: 10/09/1940 Sex: male Admission Date (Current Location): 02/05/2016  [facility] and [place] Number:  Producer, television/film/video and Address:  [facility] [place], [address] [place], [place] [address]      Provider Number: [id number]  Attending Physician Name and Address:  Laurey Morale, MD  Relative Name and Phone Number:  Jamail, Cullers [phone] or [phone]    Current Level of Care: Hospital Recommended Level of Care: Skilled Nursing Facility Prior Approval Number:    Date Approved/Denied:   PASRR Number: [id number]  Discharge Plan: SNF    Current Diagnoses: Patient Active Problem List   Diagnosis Date Noted  . Coronary artery disease involving native coronary artery of native heart without angina pectoris   . Acute on chronic renal failure (HCC)   . Acute on chronic systolic CHF (congestive heart failure) (HCC) 02/04/2016  . Hyperlipidemia 06/18/2013  . H/O class III angina pectoris 12/12/2012  . CAD (coronary artery disease) 12/12/2012  . CKD (chronic kidney disease) stage 3, GFR 30-59 ml/min 12/12/2012  . Ischemic heart disease 08/22/2011  . Diabetes mellitus 08/22/2011  . HTN (hypertension) 08/22/2011  . Obesity 08/22/2011    Orientation RESPIRATION BLADDER Height & Weight     Self, Time, Place, Situation  O2 (2 L per minute) Continent Weight: 184 lb 4.8 oz (83.598 kg) Height:  5\' 7"  (170.2 cm)  BEHAVIORAL SYMPTOMS/MOOD NEUROLOGICAL BOWEL NUTRITION STATUS      Continent Diet (Carb Modified)  AMBULATORY STATUS COMMUNICATION OF NEEDS Skin   Limited Assist Verbally Normal                       Personal Care Assistance Level of Assistance  Bathing, Dressing Bathing Assistance: Limited assistance   Dressing Assistance: Limited assistance     Functional Limitations Info             SPECIAL CARE FACTORS  FREQUENCY  PT (By licensed PT)    3x a week                Contractures      Additional Factors Info  Code Status, Allergies    Insulin Code Status Info: Full Code Allergies Info: Choline Fenofibrate    3x a day with meals       Current Medications (02/11/2016):  This is the current hospital active medication list Current Facility-Administered Medications  Medication Dose Route Frequency Provider Last Rate Last Dose  . 0.9 %  sodium chloride infusion  250 mL Intravenous PRN Mariam Dollar Tillery, PA-C      . 0.9 %  sodium chloride infusion  250 mL Intravenous PRN Laurey Morale, MD      . acetaminophen (TYLENOL) tablet 650 mg  650 mg Oral Q4H PRN Graciella Freer, PA-C   650 mg at 02/06/16 2141  . amiodarone (PACERONE) tablet 200 mg  200 mg Oral BID Laurey Morale, MD   200 mg at 02/11/16 0916  . antiseptic oral rinse (CPC / CETYLPYRIDINIUM CHLORIDE 0.05%) solution 7 mL  7 mL Mouth Rinse BID Laurey Morale, MD   7 mL at 02/11/16 1000  . aspirin EC tablet 81 mg  81 mg Oral Daily Graciella Freer, PA-C   81 mg at 02/11/16 4132  . atorvastatin (LIPITOR) tablet 40 mg  40 mg Oral QHS Graciella Freer, PA-C  40 mg at 02/10/16 2120  . gabapentin (NEURONTIN) capsule 300 mg  300 mg Oral QHS Graciella FreerMichael Andrew Tillery, PA-C   300 mg at 02/10/16 2120  . heparin injection 5,000 Units  5,000 Units Subcutaneous Q8H Laurey Moralealton S McLean, MD   5,000 Units at 02/11/16 1337  . hydrALAZINE (APRESOLINE) tablet 37.5 mg  37.5 mg Oral Q8H Laurey Moralealton S McLean, MD   37.5 mg at 02/11/16 1338  . insulin aspart (novoLOG) injection 0-15 Units  0-15 Units Subcutaneous TID WC Laurey Moralealton S McLean, MD   5 Units at 02/11/16 1240  . insulin aspart (novoLOG) injection 0-5 Units  0-5 Units Subcutaneous QHS Graciella FreerMichael Andrew Tillery, PA-C   2 Units at 02/10/16 2153  . insulin detemir (LEVEMIR) injection 10 Units  10 Units Subcutaneous Daily Amy D Clegg, NP   10 Units at 02/11/16 0916  . isosorbide mononitrate  (IMDUR) 24 hr tablet 60 mg  60 mg Oral Daily Laurey Moralealton S McLean, MD   60 mg at 02/11/16 0916  . magnesium oxide (MAG-OX) tablet 400 mg  400 mg Oral Daily Graciella FreerMichael Andrew Tillery, PA-C   400 mg at 02/11/16 [id number]  . milrinone (PRIMACOR) 20 MG/100 ML (0.2 mg/mL) infusion  0.25 mcg/kg/min Intravenous Continuous Laurey Moralealton S McLean, MD 6.8 mL/hr at 02/10/16 1900 0.25 mcg/kg/min at 02/10/16 1900  . multivitamin with minerals tablet 1 tablet  1 tablet Oral Daily Ancil Boozeraylor P Stone, RPH   1 tablet at 02/11/16 [id number]  . nitroGLYCERIN (NITROSTAT) SL tablet 0.4 mg  0.4 mg Sublingual Q5 min PRN Mariam DollarMichael Andrew Tillery, PA-C      . ondansetron [facility](ZOFRAN) injection 4 mg  4 mg Intravenous Q6H PRN Mariam DollarMichael Andrew Tillery, PA-C      . potassium chloride (K-DUR,KLOR-CON) CR tablet 20 mEq  20 mEq Oral Daily Dolores Pattyaniel R Bensimhon, MD   20 mEq at 02/11/16 0916  . sertraline (ZOLOFT) tablet 100 mg  100 mg Oral QHS Graciella FreerMichael Andrew Tillery, PA-C   100 mg at 02/10/16 2120  . sodium chloride flush (NS) 0.9 % injection 10-40 mL  10-40 mL Intracatheter Q12H Laurey Moralealton S McLean, MD   10 mL at 02/11/16 0917  . sodium chloride flush (NS) 0.9 % injection 10-40 mL  10-40 mL Intracatheter PRN Laurey Moralealton S McLean, MD      . sodium chloride flush (NS) 0.9 % injection 3 mL  3 mL Intravenous Q12H Graciella FreerMichael Andrew Tillery, PA-C   3 mL at 02/11/16 0917  . sodium chloride flush (NS) 0.9 % injection 3 mL  3 mL Intravenous PRN Mariam DollarMichael Andrew Tillery, PA-C      . sodium chloride flush (NS) 0.9 % injection 3 mL  3 mL Intravenous Q12H Laurey Moralealton S McLean, MD   3 mL at 02/11/16 0917  . sodium chloride flush (NS) 0.9 % injection 3 mL  3 mL Intravenous PRN Laurey Moralealton S McLean, MD      . spironolactone (ALDACTONE) tablet 12.5 mg  12.5 mg Oral Daily Laurey Moralealton S McLean, MD   12.5 mg at 02/11/16 [id number]  . torsemide (DEMADEX) tablet 40 mg  40 mg Oral Daily Graciella FreerMichael Andrew Tillery, PA-C   40 mg at 02/11/16 [id number]  . traMADol (ULTRAM) tablet 50 mg  50 mg Oral Q8H PRN Graciella FreerMichael Andrew Tillery, PA-C   50 mg  at 02/10/16 1952     Discharge Medications: Please see discharge summary for a list of discharge medications.  Relevant Imaging Results:  Relevant Lab Results:  Additional Information SSN [id number]  patient will have milrinone  Drip Tokiko Diefenderfer, Ervin Knack, LCSWA

## 2016-02-11 NOTE — Care Management Note (Signed)
Case Management Note  Patient Details  Name: [facility] MRN: [id number] Date of Birth: 11/07/1940  Subjective/Objective:     Adm w heart failure               Action/Plan: lives w wife, pcp dr Clelia Croftshaw   Expected Discharge Date:                  Expected Discharge Plan:  Home w Home Health Services  In-House Referral:     Discharge planning Services  CM Consult, HF Clinic  Post Acute Care Choice:  Durable Medical Equipment, Home Health Choice offered to:  Patient  DME Arranged:  IV pump/equipment DME Agency:  [facility]  HH Arranged:  RN, Disease Management HH Agency:  [facility]  Status of Service:  Completed, signed off  Medicare Important Message Given:  Yes Date Medicare IM Given:    Medicare IM give by:    Date Additional Medicare IM Given:    Additional Medicare Important Message give by:     If discussed at Long Length of Stay Meetings, dates discussed:    Additional Comments: md has arranged home iv milrinone w [facility]. Pt for dc home today. Pam w [facility] aware and will hook pt up iv milrinone prior to dc home today.  Hanley Haysowell, Dione Mccombie T, RN 02/11/2016, 9:25 AM

## 2016-02-11 NOTE — Clinical Social Work Note (Signed)
Patient to be d/c'ed today to [facility].  Patient and family agreeable to plans will transport via ems RN to call report.  Lenyx Boody, MSW, LCSWA [phone]  

## 2016-02-11 NOTE — Progress Notes (Signed)
Wife had wanted inform on life alert. Printed 2 lifealert adds for wife. Life alert is out of pocket exspence for pt so wife and pt will need to make a decision and order.

## 2016-02-18 ENCOUNTER — Encounter (HOSPITAL_COMMUNITY)
Admission: RE | Admit: 2016-02-18 | Discharge: 2016-02-18 | Disposition: A | Discharge status: Home / Self Care | Payer: No Typology Code available for payment source | Source: Ambulatory Visit | Attending: Internal Medicine | Admitting: Internal Medicine

## 2016-02-18 ENCOUNTER — Encounter (HOSPITAL_COMMUNITY): Payer: Self-pay

## 2016-02-18 ENCOUNTER — Ambulatory Visit (HOSPITAL_COMMUNITY)
Admit: 2016-02-18 | Discharge: 2016-02-18 | Disposition: A | Discharge status: Home / Self Care | Payer: Medicare Other | Source: Ambulatory Visit | Attending: Cardiology | Admitting: Cardiology

## 2016-02-18 VITALS — BP 116/68 | HR 90 | Wt 190.5 lb

## 2016-02-18 DIAGNOSIS — I255 Ischemic cardiomyopathy: Secondary | ICD-10-CM | POA: Diagnosis not present

## 2016-02-18 DIAGNOSIS — I350 Nonrheumatic aortic (valve) stenosis: Secondary | ICD-10-CM | POA: Insufficient documentation

## 2016-02-18 DIAGNOSIS — E1122 Type 2 diabetes mellitus with diabetic chronic kidney disease: Secondary | ICD-10-CM | POA: Diagnosis not present

## 2016-02-18 DIAGNOSIS — I5042 Chronic combined systolic (congestive) and diastolic (congestive) heart failure: Secondary | ICD-10-CM | POA: Diagnosis not present

## 2016-02-18 DIAGNOSIS — N189 Chronic kidney disease, unspecified: Secondary | ICD-10-CM | POA: Insufficient documentation

## 2016-02-18 DIAGNOSIS — Z7982 Long term (current) use of aspirin: Secondary | ICD-10-CM | POA: Insufficient documentation

## 2016-02-18 DIAGNOSIS — I5022 Chronic systolic (congestive) heart failure: Secondary | ICD-10-CM | POA: Diagnosis not present

## 2016-02-18 DIAGNOSIS — N184 Chronic kidney disease, stage 4 (severe): Secondary | ICD-10-CM | POA: Insufficient documentation

## 2016-02-18 DIAGNOSIS — Z86718 Personal history of other venous thrombosis and embolism: Secondary | ICD-10-CM | POA: Insufficient documentation

## 2016-02-18 DIAGNOSIS — Z823 Family history of stroke: Secondary | ICD-10-CM | POA: Diagnosis not present

## 2016-02-18 DIAGNOSIS — Z8249 Family history of ischemic heart disease and other diseases of the circulatory system: Secondary | ICD-10-CM | POA: Insufficient documentation

## 2016-02-18 DIAGNOSIS — T82898A Other specified complication of vascular prosthetic devices, implants and grafts, initial encounter: Secondary | ICD-10-CM

## 2016-02-18 DIAGNOSIS — I130 Hypertensive heart and chronic kidney disease with heart failure and stage 1 through stage 4 chronic kidney disease, or unspecified chronic kidney disease: Secondary | ICD-10-CM | POA: Insufficient documentation

## 2016-02-18 DIAGNOSIS — Z951 Presence of aortocoronary bypass graft: Secondary | ICD-10-CM | POA: Insufficient documentation

## 2016-02-18 DIAGNOSIS — E785 Hyperlipidemia, unspecified: Secondary | ICD-10-CM | POA: Insufficient documentation

## 2016-02-18 DIAGNOSIS — I2510 Atherosclerotic heart disease of native coronary artery without angina pectoris: Secondary | ICD-10-CM | POA: Diagnosis present

## 2016-02-18 DIAGNOSIS — N179 Acute kidney failure, unspecified: Secondary | ICD-10-CM | POA: Insufficient documentation

## 2016-02-18 DIAGNOSIS — I509 Heart failure, unspecified: Secondary | ICD-10-CM | POA: Diagnosis present

## 2016-02-18 DIAGNOSIS — Z794 Long term (current) use of insulin: Secondary | ICD-10-CM | POA: Insufficient documentation

## 2016-02-18 MED ORDER — ALTEPLASE 2 MG IJ SOLR
INTRAMUSCULAR | Status: AC
  Filled 2016-02-18: qty 2

## 2016-02-18 MED ORDER — ALTEPLASE 2 MG IJ SOLR: 2.0000 mg | Freq: Once | INTRAMUSCULAR | Status: DC

## 2016-02-18 MED ORDER — HYDRALAZINE HCL 50 MG PO TABS: 50.0000 mg | ORAL_TABLET | Freq: Three times a day (TID) | ORAL | Status: DC

## 2016-02-18 MED ORDER — HEPARIN SOD (PORK) LOCK FLUSH 100 UNIT/ML IV SOLN
500.0000 [IU] | Freq: Once | INTRAVENOUS | Status: AC
  Administered 2016-02-18: 500 [IU] via INTRAVENOUS

## 2016-02-18 MED ORDER — HEPARIN SOD (PORK) LOCK FLUSH 100 UNIT/ML IV SOLN
INTRAVENOUS | Status: AC
  Filled 2016-02-18: qty 5

## 2016-02-18 NOTE — Progress Notes (Signed)
Patient ID: Tristan Baker, male   DOB: 28-Sep-1940, 75 y.o.   MRN: [id number] PCP: Dr. Clelia Croft HF Cardiology: Dr. Shirlee Latch  75 yo with history of CAD s/p CABG, diabetes, prior DVT, CKD stage IV and ischemic cardiomyopathy presents for cardiology followup.  He had CABG in 1996 and later had DES to sequenial SVG-OM1/OM2 in 2014.  He went over a year without cardiology followup until 5/17, when he was seen by a PA in our office for increasing dyspnea.  Echo was done, showing a fall in EF to 25-30%.  He was thought to be volume overloaded but failed outpatient diuresis.  He was admitted in 5/17 with worsening/intractable dyspnea and fatigue.  He was diuresed in the hospital and started on milrinone due to low cardiac output.  After fully diuresing him, we tried to wean him off milrinone but were unable to do so due to fall in cardiac output.  RHC was done on 0.25 mcg/kg/min milrinone, cardiac output was marginal even on this dose of milrinone.  He ended up being sent home on milrinone.  He was thought to be a poor LVAD candidate due to his degree of renal dysfunction (CKD stage IV).  While in the hospital, he was noted to have frequent PVCs and runs of NSVT, so amiodarone was started.  He was sent out to [facility] and is now there getting PT.  He seems to be doing well.  He denies dyspnea walking in the halls at [facility].  No chest pain.  No orthopnea/PND. No palpitations or lightheadedness.  He is eager to go home.    ECG: NSR, RBBB, PVCs  Labs (6/17): K 4.1, creatinine 2.49, HCT 37.4  PMH: 1. CAD: s/p CABG 1996 with RIMA-RCA, LIMA-LAD, sequention SVG-OM1/OM2.  DES to sequential SVG-OM1/OM2 in 2014.   2. Type II diabetes. 3. HTN 4. Hyperlipidemia. 5. Prior DVT 6. CKD stage IV: Sees Dr Marisue Humble. 7. Chronic systolic CHF: Ischemic cardiomyopathy.   - Echo (5/17) with EF 25-30%, mild to moderate AS, PA systolic pressure 40 mmHg. - RHC (5/17) on milrinone 0.25: mean RA 6, PA 41/15 mean 21, mean PCWP 9, CI 2.17  Fick/1.95 thermodilution.  8. Aortic stenosis: Mild to moderate on 5/17 echo.   Social History   Social History  . Marital Status: Married    Spouse Name: Graciella Belton  . Number of Children: 4  . Years of Education: college   Occupational History  .       [employer]   Social History Main Topics  . Smoking status: Never Smoker   . Smokeless tobacco: Never Used  . Alcohol Use: No  . Drug Use: No  . Sexual Activity: Not Currently   Other Topics Concern  . Not on file   Social History Narrative   Patient lives at home with his spouse.   Caffeine Use: 2 cups daily; ice tea or soda daily   Family History  Problem Relation Age of Onset  . Stroke Mother   . Heart disease Father    ROS: All systems reviewed and negative except as per HPI.   Current Outpatient Prescriptions  Medication Sig Dispense Refill  . amiodarone (PACERONE) 200 MG tablet 200 mg twice a x 7 days, then 200 mg daily 60 tablet 6  . aspirin 81 MG tablet Take 1 tablet (81 mg total) by mouth daily.    Marland Kitchen atorvastatin (LIPITOR) 40 MG tablet Take 40 mg by mouth daily.    Marland Kitchen gabapentin (NEURONTIN) 300 MG capsule  Take 1 capsule (300 mg total) by mouth at bedtime.    . hydrALAZINE (APRESOLINE) 50 MG tablet Take 1 tablet (50 mg total) by mouth every 8 (eight) hours. 90 tablet 3  . insulin detemir (LEVEMIR) 100 UNIT/ML injection Inject 0.3 mLs (30 Units total) into the skin at bedtime. 10 mL 11  . isosorbide mononitrate (IMDUR) 60 MG 24 hr tablet Take 1 tablet (60 mg total) by mouth daily. 30 tablet 6  . Magnesium 200 MG TABS Take 200 mg by mouth daily.    . milrinone (PRIMACOR) 20 MG/100 ML SOLN infusion Inject 22.575 mcg/min into the vein continuous. 200 mL 12  . Multiple Vitamin (MULTIVITAMIN WITH MINERALS) TABS tablet Take 1 tablet by mouth at bedtime.    . potassium chloride (K-DUR,KLOR-CON) 20 MEQ tablet Take 1 tablet (20 mEq total) by mouth daily. 30 tablet 6  . sertraline (ZOLOFT) 100 MG tablet Take 50 mg by mouth  at bedtime.     Marland Kitchen. spironolactone (ALDACTONE) 25 MG tablet Take 0.5 tablets (12.5 mg total) by mouth daily. 30 tablet 6  . torsemide (DEMADEX) 20 MG tablet Take 2 tablets (40 mg total) by mouth daily. 60 tablet 6  . traMADol (ULTRAM) 50 MG tablet Take 1 tablet (50 mg total) by mouth every 12 (twelve) hours as needed for moderate pain. 30 tablet 0  . alteplase (CATHFLO ACTIVASE) 2 MG injection 2 mg by Intracatheter route once. 1 each 0  . nitroGLYCERIN (NITROSTAT) 0.4 MG SL tablet Place 1 tablet (0.4 mg total) under the tongue every 5 (five) minutes as needed for chest pain (up to 3 doses). (Patient not taking: Reported on 02/18/2016)     Current Facility-Administered Medications  Medication Dose Route Frequency Provider Last Rate Last Dose  . alteplase (CATHFLO ACTIVASE) injection 2 mg  2 mg Intracatheter Once Laurey Moralealton S McLean, MD       Facility-Administered Medications Ordered in Other Encounters  Medication Dose Route Frequency Provider Last Rate Last Dose  . alteplase (CATHFLO ACTIVASE) injection 2 mg  2 mg Intracatheter Once Dolores Pattyaniel R Bensimhon, MD   2 mg at 02/18/16 1116  . heparin lock flush 100 UNIT/ML injection            BP 116/68 mmHg  Pulse 90  Wt 190 lb 8 oz (86.41 kg)  SpO2 97% General: NAD Neck: JVP 8 cm, no thyromegaly or thyroid nodule.  Lungs: Clear to auscultation bilaterally with normal respiratory effort. CV: Nondisplaced PMI.  Heart regular S1/S2, no S3/S4, 2/6 early SEM RUSB.  1+ ankle edema.  No carotid bruit.  Normal pedal pulses.  Abdomen: Soft, nontender, no hepatosplenomegaly, no distention.  Skin: Intact without lesions or rashes.  Neurologic: Alert and oriented x 3.  Psych: Normal affect. Extremities: No clubbing or cyanosis.  HEENT: Normal.   Assessment/Plan: 1. Chronic systolic CHF: Ischemic cardiomyopathy.  Recent admission with identification of low output heart failure.  Unfortunately, it appears at this point that he is end-stage.  He advanced renal  disease precludes LVAD placement and he is of too advanced age for transplant.  We were unable to titrate him off milrinone during recent admission, he remains on milrinone at 0.25. NYHA class II-III symptoms, improved compared to prior to hospital.  He is not significantly volume overloaded.  - Will get co-ox today off PICC.  If adequate, will try decreasing milrinone to 0.125 mcg/kg/min.  If marginal, will continue current milrinone. - Increase hydralazine to 50 mg tid, continue Imdur 60 daily.  -  No ACEI/ARB due to CKD stage IV.  - Holding off on beta blocker for right now with low output/on milrinone.  - Continue spironolactone 12.5 mg daily . - BMET/BNP today.  2. CAD: S/p CABG.  No chest pain.  I am concerned that fall in EF recently may coincide to worsening CAD.  However, he has not had chest pain concerning for ACS, so given CKD stage IV, would hold off on coronary angiography.  - Continue ASA 81 and atorvastatin.  3. PVCs/NSVT: Noted while on milrinone.  Amiodarone was started to try to suppress.  He is not an ICD candidate with end-stage CHF requiring milrinone.  - With amiodarone use, need to check CMET and TSH.  Will need regular eye exam on amiodarone.  4. CKD: Stage IV.  Follows with Dr Marisue Humble.  5. Aortic stenosis: I reviewed last echo, AS appears to be mild to moderate.   Followup 1 week to attempt further down-titration of milrinone.   Marca Ancona 02/18/2016

## 2016-02-18 NOTE — Patient Instructions (Addendum)
Routine lab work today. Will notify you of abnormal results  Increase Hydralazine to 50mg  three times daily.  Return 02/29/2016 at 2:30 pm.  Do the following things EVERYDAY: 1) Weigh yourself in the morning before breakfast. Write it down and keep it in a log. 2) Take your medicines as prescribed 3) Eat low salt foods-Limit salt (sodium) to 2000 mg per day.  4) Stay as active as you can everyday 5) Limit all fluids for the day to less than 2 liters

## 2016-02-23 ENCOUNTER — Telehealth (HOSPITAL_COMMUNITY): Payer: Self-pay | Admitting: *Deleted

## 2016-02-23 NOTE — Telephone Encounter (Signed)
Pt's wife called earlier concerned about pt.  [facility]  She states she feels like pt is starting to retain fluid, his abd seems to be swollen to her and his feet/legs are swollen and big.  She also reports pt was feeling nauseated yesterday.  She states they have been weighing him and his wt had been 187 but was up to 193 lb 2 days ago, she is unsure what it is now.  I called [facility] and spoke w/pt's nurse and nurse tech, they state pt seems stable to them, his wt was 189 lbs and has been stable over past couple of days.  They report his abd seems ok and stable to them, his feet/legs are swollen some but has been up a lot.  They state pt's breathing seems fine and is not complaining about feeling bad or having any issues.  They ask that when pt comes to next appt for us to fax them updates and any new orders to them at [phone].  Called pt's wife back and let her know what they had to say and that they are not concerned about pt and feel he is stable.  Wife is very upset, almost in tears on the phone.  She states pt is not doing well in her eyes and she is seeing signs that she saw before he was hospitalized last time and does not want him back in the hospital. appt sch for pt to see Mardelle Mat[place]y, [place] tomorrow at 12, wife is very happy to bring him in for eval and states she will be bringing him.

## 2016-02-23 NOTE — Progress Notes (Signed)
Patient ID: Tristan SkainsFrank Baker, male   DOB: 11/30/1940, 75 y.o.   MRN: [id number]    Advanced Heart Failure Clinic Note   PCP: Dr. Clelia CroftShaw HF Cardiology: Dr. Shirlee LatchMcLean  75 yo with history of CAD s/p CABG, diabetes, prior DVT, CKD stage IV and ischemic cardiomyopathy presents for cardiology followup.  He had CABG in 1996 and later had DES to sequenial SVG-OM1/OM2 in 2014.  He went over a year without cardiology followup until 5/17, when he was seen by a PA in our office for increasing dyspnea.  Echo was done, showing a fall in EF to 25-30%.  He was thought to be volume overloaded but failed outpatient diuresis.  He was admitted in 5/17 with worsening/intractable dyspnea and fatigue.  He was diuresed in the hospital and started on milrinone due to low cardiac output.  After fully diuresing him, we tried to wean him off milrinone but were unable to do so due to fall in cardiac output.  RHC was done on 0.25 mcg/kg/min milrinone, cardiac output was marginal even on this dose of milrinone.  He ended up being sent home on milrinone.  He was thought to be a poor LVAD candidate due to his degree of renal dysfunction (CKD stage IV).  While in the hospital, he was noted to have frequent PVCs and runs of NSVT, so amiodarone was started.    He returns today for add on with complaints of malaise per his wife, although he has been stable per [facility]. He felt great up until three days ago when started on a "new pill" per "Dr [facility]".  Remains at [facility].  Refused all of his medicines this am, as they cannot "figure out which medicine made him feel bad." He states he will continue to refuse all medicines until something is changed. He is dyspneic again walking short distances, where he was using exercise arm bike for 20 minutes last week.  Thinks swelling is coming back. He is anxious to go home.    ECG: #1 Sinus Tach 118 bpm, with frequent and consecutive PVCs, RBBB ECG: #2: 13 beat run of NSVT Labs (6/17): K 4.1,  creatinine 2.49, HCT 37.4  PMH: 1. CAD: s/p CABG 1996 with RIMA-RCA, LIMA-LAD, sequention SVG-OM1/OM2.  DES to sequential SVG-OM1/OM2 in 2014.   2. Type II diabetes 3. HTN 4. Hyperlipidemia 5. Prior DVT 6. CKD stage IV: Sees Dr Marisue [facility]. 7. Chronic systolic CHF: Ischemic cardiomyopathy.   - Echo (5/17) with EF 25-30%, mild to moderate AS, PA systolic pressure 40 mmHg. - RHC (5/17) on milrinone 0.25: mean RA 6, PA 41/15 mean 21, mean PCWP 9, CI 2.17 Fick/1.95 thermodilution.  8. Aortic stenosis: Mild to moderate on 5/17 echo.   Social History   Social History  . Marital Status: Married    Spouse Name: [name]  . Number of Children: 4  . Years of Education: college   Occupational History  .       [employer]   Social History Main Topics  . Smoking status: Never Smoker   . Smokeless tobacco: Never Used  . Alcohol Use: No  . Drug Use: No  . Sexual Activity: Not Currently   Other Topics Concern  . Not on file   Social History Narrative   Patient lives at home with his spouse.   Caffeine Use: 2 cups daily; ice tea or soda daily   Family History  Problem Relation Age of Onset  . Stroke Mother   . Heart disease Father  ROS: All systems reviewed and negative except as per HPI.   Current Outpatient Prescriptions  Medication Sig Dispense Refill  . amiodarone (PACERONE) 200 MG tablet Take 200 mg by mouth daily.    Marland Kitchen aspirin 81 MG tablet Take 1 tablet (81 mg total) by mouth daily.    Marland Kitchen atorvastatin (LIPITOR) 40 MG tablet Take 40 mg by mouth daily.    Marland Kitchen gabapentin (NEURONTIN) 300 MG capsule Take 1 capsule (300 mg total) by mouth at bedtime.    . hydrALAZINE (APRESOLINE) 50 MG tablet Take 1 tablet (50 mg total) by mouth every 8 (eight) hours. 90 tablet 3  . insulin aspart (NOVOLOG) 100 UNIT/ML injection Inject into the skin 3 (three) times daily before meals. Per sliding scale    . insulin detemir (LEVEMIR) 100 UNIT/ML injection Inject 0.3 mLs (30 Units total) into the skin  at bedtime. 10 mL 11  . isosorbide mononitrate (IMDUR) 60 MG 24 hr tablet Take 1 tablet (60 mg total) by mouth daily. 30 tablet 6  . levothyroxine (SYNTHROID, LEVOTHROID) 25 MCG tablet Take 25 mcg by mouth daily before breakfast.    . Magnesium 200 MG TABS Take 200 mg by mouth daily.    . milrinone (PRIMACOR) 20 MG/100 ML SOLN infusion Inject 22.575 mcg/min into the vein continuous. 200 mL 12  . Multiple Vitamin (MULTIVITAMIN WITH MINERALS) TABS tablet Take 1 tablet by mouth at bedtime.    . nitroGLYCERIN (NITROSTAT) 0.4 MG SL tablet Place 1 tablet (0.4 mg total) under the tongue every 5 (five) minutes as needed for chest pain (up to 3 doses).    . ondansetron (ZOFRAN) 4 MG tablet Take 4 mg by mouth every 6 (six) hours as needed for nausea or vomiting.    . polyethylene glycol (MIRALAX / GLYCOLAX) packet Take 17 g by mouth daily.    . potassium chloride (K-DUR,KLOR-CON) 20 MEQ tablet Take 1 tablet (20 mEq total) by mouth daily. 30 tablet 6  . sertraline (ZOLOFT) 100 MG tablet Take 50 mg by mouth at bedtime.     Marland Kitchen spironolactone (ALDACTONE) 25 MG tablet Take 0.5 tablets (12.5 mg total) by mouth daily. 30 tablet 6  . torsemide (DEMADEX) 20 MG tablet Take 2 tablets (40 mg total) by mouth daily. 60 tablet 6  . traMADol (ULTRAM) 50 MG tablet Take 1 tablet (50 mg total) by mouth every 12 (twelve) hours as needed for moderate pain. 30 tablet 0   No current facility-administered medications for this encounter.   BP 112/62 mmHg  Pulse 137  Wt 194 lb 6.4 oz (88.179 kg)  SpO2 95%   Wt Readings from Last 3 Encounters:  02/24/16 194 lb 6.4 oz (88.179 kg)  02/18/16 190 lb 8 oz (86.41 kg)  02/11/16 184 lb 4.8 oz (83.598 kg)     General: NAD Neck: JVP 8-9 cm, no thyromegaly or thyroid nodule.  Lungs: Mildly decreased basilar sounds CV: Nondisplaced PMI.  Heart regular S1/S2, no S3/S4, 2/6 early SEM RUSB.  1+ ankle edema.  No carotid bruit.  Normal pedal pulses.  Abdomen: Soft, NT, mildly distended,  no HSM. No bruits or masses. +BS  Skin: Intact without lesions or rashes.  Neurologic: Alert and oriented x 3.  Psych: Normal affect. Extremities: No clubbing or cyanosis.  HEENT: Normal.   Assessment/Plan: 1. Acute on chronic systolic CHF: Ischemic cardiomyopathy. LVEF 25-30%  Recent admission with identification of low output heart failure.  Unfortunately, it appears at this point that he is end-stage.  He advanced renal disease precludes LVAD placement and he is of too advanced age for transplant.  We were unable to titrate him off milrinone during recent admission, he remains on milrinone at 0.25. NYHA class II-III symptoms, improved compared to prior to hospital.  - He is up 10 lbs from discharge. And having long runs of NSVT on EKG.  He needs to be admitted.  - Continue hydralazine 50 mg tid, continue Imdur 60 daily.  - No ACEI/ARB due to CKD stage IV.  - Holding off on beta blocker for right now with low output/on milrinone.  - Continue spironolactone 12.5 mg daily . 2. CAD: S/p CABG.  No chest pain.  Concerned that fall in EF recently may coincide to worsening CAD.  However, he has not had chest pain concerning for ACS, so given CKD stage IV, would hold off on coronary angiography.  - Continue ASA 81 and atorvastatin.  3. PVCs/NSVT: Noted while on milrinone.  Amiodarone was started to try to suppress.  He is not an ICD candidate with end-stage CHF requiring milrinone.  - Recent LFTs fine on amio. TSH just above normal.  - Will need regular eye exam on amiodarone.  - Worse today with 13 beat run of NSVT on EKG admission as above.  4. CKD: Stage IV.  Follows with Dr Marisue Humble.  5. Aortic stenosis: by last echo, AS appears to be mild to moderate.  6. Mildly elevated TSH - He was recently started on synthroid and pt is adamant that this is what is causing his symptoms. We will stop this, despite length conversation that pts heart failure is what is causing his problems.   We will admit  today for diuresis and monitoring of his NSVT.  He has very poor prognosis. Will likely need hospice if he refuses SNF moving forward.   We had lengthy discussion with patient about the seriousness of his degree of disease and his risk of sudden cardiac death.  He initially refused admission and any medication changes.    Graciella Freer, PA-C 02/24/2016

## 2016-02-24 ENCOUNTER — Ambulatory Visit (HOSPITAL_BASED_OUTPATIENT_CLINIC_OR_DEPARTMENT_OTHER)
Admission: RE | Admit: 2016-02-24 | Discharge: 2016-02-24 | Disposition: A | Discharge status: Home / Self Care | Payer: Medicare Other | Source: Ambulatory Visit | Attending: Internal Medicine | Admitting: Internal Medicine

## 2016-02-24 ENCOUNTER — Inpatient Hospital Stay (HOSPITAL_COMMUNITY): Payer: Medicare Other

## 2016-02-24 ENCOUNTER — Inpatient Hospital Stay (HOSPITAL_COMMUNITY)
Admission: AD | Admit: 2016-02-24 | Discharge: 2016-03-01 | DRG: 291 | Disposition: A | Discharge status: Skilled Nursing Facility | Payer: Medicare Other | Source: Ambulatory Visit | Attending: Internal Medicine | Admitting: Internal Medicine

## 2016-02-24 ENCOUNTER — Encounter (HOSPITAL_COMMUNITY): Payer: Self-pay | Admitting: General Practice

## 2016-02-24 VITALS — BP 112/62 | HR 137 | Wt 194.4 lb

## 2016-02-24 DIAGNOSIS — E785 Hyperlipidemia, unspecified: Secondary | ICD-10-CM | POA: Diagnosis present

## 2016-02-24 DIAGNOSIS — Z79899 Other long term (current) drug therapy: Secondary | ICD-10-CM

## 2016-02-24 DIAGNOSIS — I255 Ischemic cardiomyopathy: Secondary | ICD-10-CM | POA: Insufficient documentation

## 2016-02-24 DIAGNOSIS — I350 Nonrheumatic aortic (valve) stenosis: Secondary | ICD-10-CM | POA: Insufficient documentation

## 2016-02-24 DIAGNOSIS — Z86718 Personal history of other venous thrombosis and embolism: Secondary | ICD-10-CM | POA: Insufficient documentation

## 2016-02-24 DIAGNOSIS — I130 Hypertensive heart and chronic kidney disease with heart failure and stage 1 through stage 4 chronic kidney disease, or unspecified chronic kidney disease: Principal | ICD-10-CM | POA: Diagnosis present

## 2016-02-24 DIAGNOSIS — I472 Ventricular tachycardia: Secondary | ICD-10-CM

## 2016-02-24 DIAGNOSIS — N179 Acute kidney failure, unspecified: Secondary | ICD-10-CM | POA: Diagnosis present

## 2016-02-24 DIAGNOSIS — Z823 Family history of stroke: Secondary | ICD-10-CM | POA: Insufficient documentation

## 2016-02-24 DIAGNOSIS — Z7982 Long term (current) use of aspirin: Secondary | ICD-10-CM

## 2016-02-24 DIAGNOSIS — R1110 Vomiting, unspecified: Secondary | ICD-10-CM | POA: Diagnosis not present

## 2016-02-24 DIAGNOSIS — R112 Nausea with vomiting, unspecified: Secondary | ICD-10-CM | POA: Insufficient documentation

## 2016-02-24 DIAGNOSIS — H9190 Unspecified hearing loss, unspecified ear: Secondary | ICD-10-CM | POA: Diagnosis present

## 2016-02-24 DIAGNOSIS — Z8249 Family history of ischemic heart disease and other diseases of the circulatory system: Secondary | ICD-10-CM

## 2016-02-24 DIAGNOSIS — N184 Chronic kidney disease, stage 4 (severe): Secondary | ICD-10-CM | POA: Diagnosis not present

## 2016-02-24 DIAGNOSIS — I5042 Chronic combined systolic (congestive) and diastolic (congestive) heart failure: Secondary | ICD-10-CM

## 2016-02-24 DIAGNOSIS — I5023 Acute on chronic systolic (congestive) heart failure: Secondary | ICD-10-CM

## 2016-02-24 DIAGNOSIS — R0600 Dyspnea, unspecified: Secondary | ICD-10-CM | POA: Diagnosis present

## 2016-02-24 DIAGNOSIS — Z794 Long term (current) use of insulin: Secondary | ICD-10-CM

## 2016-02-24 DIAGNOSIS — Z452 Encounter for adjustment and management of vascular access device: Secondary | ICD-10-CM

## 2016-02-24 DIAGNOSIS — I493 Ventricular premature depolarization: Secondary | ICD-10-CM | POA: Diagnosis present

## 2016-02-24 DIAGNOSIS — E1122 Type 2 diabetes mellitus with diabetic chronic kidney disease: Secondary | ICD-10-CM

## 2016-02-24 DIAGNOSIS — Z515 Encounter for palliative care: Secondary | ICD-10-CM | POA: Diagnosis present

## 2016-02-24 DIAGNOSIS — Z951 Presence of aortocoronary bypass graft: Secondary | ICD-10-CM | POA: Diagnosis not present

## 2016-02-24 DIAGNOSIS — I2510 Atherosclerotic heart disease of native coronary artery without angina pectoris: Secondary | ICD-10-CM | POA: Insufficient documentation

## 2016-02-24 DIAGNOSIS — Z7189 Other specified counseling: Secondary | ICD-10-CM | POA: Diagnosis not present

## 2016-02-24 DIAGNOSIS — Z66 Do not resuscitate: Secondary | ICD-10-CM | POA: Diagnosis present

## 2016-02-24 DIAGNOSIS — Z95828 Presence of other vascular implants and grafts: Secondary | ICD-10-CM

## 2016-02-24 HISTORY — DX: Chronic kidney disease, stage 4 (severe): N18.4

## 2016-02-24 LAB — TROPONIN I
TROPONIN I: 0.15 ng/mL — AB (ref <0.031)
Troponin I: 0.18 ng/mL — ABNORMAL HIGH (ref <0.031)

## 2016-02-24 LAB — GLUCOSE, CAPILLARY
GLUCOSE-CAPILLARY: 184 mg/dL — AB (ref 65–99)
GLUCOSE-CAPILLARY: 273 mg/dL — AB (ref 65–99)

## 2016-02-24 LAB — CBC
HEMATOCRIT: 36.3 % — AB (ref 39.0–52.0)
HEMOGLOBIN: 11.5 g/dL — AB (ref 13.0–17.0)
MCH: 29.3 pg (ref 26.0–34.0)
MCHC: 31.7 g/dL (ref 30.0–36.0)
MCV: 92.4 fL (ref 78.0–100.0)
Platelets: 193 10*3/uL (ref 150–400)
RBC: 3.93 MIL/uL — ABNORMAL LOW (ref 4.22–5.81)
RDW: 15 % (ref 11.5–15.5)
WBC: 7.3 10*3/uL (ref 4.0–10.5)

## 2016-02-24 LAB — BASIC METABOLIC PANEL
ANION GAP: 12 (ref 5–15)
BUN: 56 mg/dL — AB (ref 6–20)
CALCIUM: 9.2 mg/dL (ref 8.9–10.3)
CO2: 21 mmol/L — AB (ref 22–32)
Chloride: 101 mmol/L (ref 101–111)
Creatinine, Ser: 3.14 mg/dL — ABNORMAL HIGH (ref 0.61–1.24)
GFR calc Af Amer: 21 mL/min — ABNORMAL LOW (ref >60)
GFR calc non Af Amer: 18 mL/min — ABNORMAL LOW (ref >60)
GLUCOSE: 139 mg/dL — AB (ref 65–99)
Potassium: 4.8 mmol/L (ref 3.5–5.1)
Sodium: 134 mmol/L — ABNORMAL LOW (ref 135–145)

## 2016-02-24 LAB — MAGNESIUM: MAGNESIUM: 2.1 mg/dL (ref 1.7–2.4)

## 2016-02-24 MED ORDER — INSULIN ASPART 100 UNIT/ML ~~LOC~~ SOLN
3.0000 [IU] | Freq: Three times a day (TID) | SUBCUTANEOUS | Status: DC
  Administered 2016-02-25 (×3): 3 [IU] via SUBCUTANEOUS

## 2016-02-24 MED ORDER — INSULIN DETEMIR 100 UNIT/ML ~~LOC~~ SOLN
30.0000 [IU] | Freq: Every day | SUBCUTANEOUS | Status: DC
  Administered 2016-02-24 – 2016-02-29 (×6): 30 [IU] via SUBCUTANEOUS
  Filled 2016-02-24 (×7): qty 0.3

## 2016-02-24 MED ORDER — SODIUM CHLORIDE 0.9% FLUSH: 3.0000 mL | INTRAVENOUS | Status: DC | PRN

## 2016-02-24 MED ORDER — SPIRONOLACTONE 25 MG PO TABS
12.5000 mg | ORAL_TABLET | Freq: Every day | ORAL | Status: DC
  Administered 2016-02-25 – 2016-02-26 (×2): 12.5 mg via ORAL
  Filled 2016-02-24 (×2): qty 1

## 2016-02-24 MED ORDER — GABAPENTIN 300 MG PO CAPS
300.0000 mg | ORAL_CAPSULE | Freq: Every day | ORAL | Status: DC
  Administered 2016-02-24 – 2016-02-29 (×6): 300 mg via ORAL
  Filled 2016-02-24 (×6): qty 1

## 2016-02-24 MED ORDER — FUROSEMIDE 10 MG/ML IJ SOLN
80.0000 mg | Freq: Two times a day (BID) | INTRAMUSCULAR | Status: DC
  Administered 2016-02-24 – 2016-02-27 (×6): 80 mg via INTRAVENOUS
  Filled 2016-02-24 (×6): qty 8

## 2016-02-24 MED ORDER — MILRINONE LACTATE IN DEXTROSE 20-5 MG/100ML-% IV SOLN: 0.2500 ug/kg/min | INTRAVENOUS | Status: DC

## 2016-02-24 MED ORDER — ADULT MULTIVITAMIN W/MINERALS CH
1.0000 | ORAL_TABLET | Freq: Every day | ORAL | Status: DC
  Administered 2016-02-24 – 2016-02-29 (×6): 1 via ORAL
  Filled 2016-02-24 (×6): qty 1

## 2016-02-24 MED ORDER — MILRINONE LACTATE IN DEXTROSE 20-5 MG/100ML-% IV SOLN
0.2500 ug/kg/min | INTRAVENOUS | Status: DC
  Administered 2016-02-24 – 2016-02-29 (×9): 0.25 ug/kg/min via INTRAVENOUS
  Filled 2016-02-24 (×9): qty 100

## 2016-02-24 MED ORDER — NITROGLYCERIN 0.4 MG SL SUBL: 0.4000 mg | SUBLINGUAL_TABLET | SUBLINGUAL | Status: DC | PRN

## 2016-02-24 MED ORDER — ATORVASTATIN CALCIUM 40 MG PO TABS
40.0000 mg | ORAL_TABLET | Freq: Every day | ORAL | Status: DC
  Administered 2016-02-24 – 2016-02-25 (×2): 40 mg via ORAL
  Filled 2016-02-24 (×2): qty 1

## 2016-02-24 MED ORDER — MAGNESIUM OXIDE 400 (241.3 MG) MG PO TABS
400.0000 mg | ORAL_TABLET | Freq: Every day | ORAL | Status: DC
  Administered 2016-02-24 – 2016-03-01 (×7): 400 mg via ORAL
  Filled 2016-02-24 (×7): qty 1

## 2016-02-24 MED ORDER — AMIODARONE HCL 200 MG PO TABS
200.0000 mg | ORAL_TABLET | Freq: Two times a day (BID) | ORAL | Status: DC
  Administered 2016-02-24 – 2016-02-26 (×5): 200 mg via ORAL
  Filled 2016-02-24 (×5): qty 1

## 2016-02-24 MED ORDER — ACETAMINOPHEN 325 MG PO TABS
650.0000 mg | ORAL_TABLET | ORAL | Status: DC | PRN
  Administered 2016-02-24 – 2016-02-26 (×4): 650 mg via ORAL
  Filled 2016-02-24 (×4): qty 2

## 2016-02-24 MED ORDER — SODIUM CHLORIDE 0.9 % IV SOLN
250.0000 mL | INTRAVENOUS | Status: DC | PRN
  Administered 2016-02-24 – 2016-02-29 (×2): 250 mL via INTRAVENOUS

## 2016-02-24 MED ORDER — POTASSIUM CHLORIDE CRYS ER 20 MEQ PO TBCR
20.0000 meq | EXTENDED_RELEASE_TABLET | Freq: Every day | ORAL | Status: DC
  Administered 2016-02-25 – 2016-03-01 (×6): 20 meq via ORAL
  Filled 2016-02-24 (×6): qty 1

## 2016-02-24 MED ORDER — TRAMADOL HCL 50 MG PO TABS: 50.0000 mg | ORAL_TABLET | Freq: Two times a day (BID) | ORAL | Status: DC | PRN

## 2016-02-24 MED ORDER — SERTRALINE HCL 50 MG PO TABS
50.0000 mg | ORAL_TABLET | Freq: Every day | ORAL | Status: DC
  Administered 2016-02-24 – 2016-02-29 (×6): 50 mg via ORAL
  Filled 2016-02-24 (×6): qty 1

## 2016-02-24 MED ORDER — HYDRALAZINE HCL 50 MG PO TABS
50.0000 mg | ORAL_TABLET | Freq: Three times a day (TID) | ORAL | Status: DC
  Administered 2016-02-25 – 2016-03-01 (×13): 50 mg via ORAL
  Filled 2016-02-24 (×17): qty 1

## 2016-02-24 MED ORDER — ENOXAPARIN SODIUM 40 MG/0.4ML ~~LOC~~ SOLN
40.0000 mg | SUBCUTANEOUS | Status: DC
  Administered 2016-02-24: 40 mg via SUBCUTANEOUS
  Filled 2016-02-24: qty 0.4

## 2016-02-24 MED ORDER — SODIUM CHLORIDE 0.9% FLUSH
3.0000 mL | Freq: Two times a day (BID) | INTRAVENOUS | Status: DC
  Administered 2016-02-24 – 2016-03-01 (×8): 3 mL via INTRAVENOUS

## 2016-02-24 MED ORDER — POLYETHYLENE GLYCOL 3350 17 G PO PACK
17.0000 g | PACK | Freq: Every day | ORAL | Status: DC
Start: 2016-02-24 — End: 2016-03-01
  Administered 2016-02-25 – 2016-03-01 (×3): 17 g via ORAL
  Filled 2016-02-24 (×7): qty 1

## 2016-02-24 MED ORDER — ONDANSETRON HCL 4 MG/2ML IJ SOLN
4.0000 mg | Freq: Four times a day (QID) | INTRAMUSCULAR | Status: DC | PRN
  Administered 2016-02-25 – 2016-03-01 (×9): 4 mg via INTRAVENOUS
  Filled 2016-02-24 (×9): qty 2

## 2016-02-24 MED ORDER — ASPIRIN EC 81 MG PO TBEC
81.0000 mg | DELAYED_RELEASE_TABLET | Freq: Every day | ORAL | Status: DC
  Administered 2016-02-24 – 2016-03-01 (×7): 81 mg via ORAL
  Filled 2016-02-24 (×7): qty 1

## 2016-02-24 MED ORDER — ISOSORBIDE MONONITRATE ER 60 MG PO TB24
60.0000 mg | ORAL_TABLET | Freq: Every day | ORAL | Status: DC
  Administered 2016-02-24 – 2016-03-01 (×7): 60 mg via ORAL
  Filled 2016-02-24 (×7): qty 1

## 2016-02-24 NOTE — Progress Notes (Signed)
Pt a/o, no c/o pain, pt has home milrinone through PICC that is not working, pt now has periph w/Milrinone gtt, pt was upset bc home Milrinone wasn't disconnected, IV team came up and disconnected him, daughter stated pt may be upset later in night d/t pt had argument w/his wife, VSS, pt stable

## 2016-02-24 NOTE — H&P (Signed)
ADVANCED HF H&P NOTE  Patient ID: Tristan Baker, male DOB: 04/04/1941, 75 y.o. MRN: [id number]  -  PCP: Dr. Clelia Croft HF Cardiology: Dr. Shirlee Latch  75 yo with history of CAD s/p CABG, diabetes, prior DVT, CKD stage IV and ischemic cardiomyopathy presents for cardiology followup. He had CABG in 1996 and later had DES to sequenial SVG-OM1/OM2 in 2014. He went over a year without cardiology followup until 5/17, when he was seen by a PA in our office for increasing dyspnea. Echo was done, showing a fall in EF to 25-30%. He was thought to be volume overloaded but failed outpatient diuresis. He was admitted in 5/17 with worsening/intractable dyspnea and fatigue. He was diuresed in the hospital and started on milrinone due to low cardiac output. After fully diuresing him, we tried to wean him off milrinone but were unable to do so due to fall in cardiac output. RHC was done on 0.25 mcg/kg/min milrinone, cardiac output was marginal even on this dose of milrinone. He ended up being sent home on milrinone. He was thought to be a poor LVAD candidate due to his degree of renal dysfunction (CKD stage IV). While in the hospital, he was noted to have frequent PVCs and runs of NSVT, so amiodarone was started.   He returns today for add on with complaints of malaise per his wife, although he has been stable per [facility]. He felt great up until three days ago when started on a "new pill" (synthroid)per "Dr Evlyn Kanner". Remains at [facility]. Refused all of his medicines this am, as they cannot "figure out which medicine made him feel bad." He states he will continue to refuse all medicines until something is changed. He is dyspneic again walking short distances, where he was using exercise arm bike for 20 minutes last week. He is anxious to go home.   He says his fluid is better than when he was discharged but he is actually up 10 pounds from d/c.   ECG: #1 Sinus Tach 118 bpm, with frequent and consecutive  PVCs, RBBB ECG: #2: 13 beat run of NSVT Labs (6/17): K 4.1, creatinine 2.49, HCT 37.4  PMH: 1. CAD: s/p CABG 1996 with RIMA-RCA, LIMA-LAD, sequention SVG-OM1/OM2. DES to sequential SVG-OM1/OM2 in 2014.  2. Type II diabetes 3. HTN 4. Hyperlipidemia 5. Prior DVT 6. CKD stage IV: Sees Dr Marisue Humble. 7. Chronic systolic CHF: Ischemic cardiomyopathy.  - Echo (5/17) with EF 25-30%, mild to moderate AS, PA systolic pressure 40 mmHg. - RHC (5/17) on milrinone 0.25: mean RA 6, PA 41/15 mean 21, mean PCWP 9, CI 2.17 Fick/1.95 thermodilution.  8. Aortic stenosis: Mild to moderate on 5/17 echo.   Social History   Social History  . Marital Status: Married    Spouse Name: Graciella Belton  . Number of Children: 4  . Years of Education: college   Occupational History  .      [employer]   Social History Main Topics  . Smoking status: Never Smoker   . Smokeless tobacco: Never Used  . Alcohol Use: No  . Drug Use: No  . Sexual Activity: Not Currently   Other Topics Concern  . Not on file   Social History Narrative   Patient lives at home with his spouse.   Caffeine Use: 2 cups daily; ice tea or soda daily   Family History  Problem Relation Age of Onset  . Stroke Mother   . Heart disease Father    ROS: All systems reviewed and negative  except as per HPI.   Current Outpatient Prescriptions  Medication Sig Dispense Refill  . amiodarone (PACERONE) 200 MG tablet Take 200 mg by mouth daily.    Marland Kitchen. aspirin 81 MG tablet Take 1 tablet (81 mg total) by mouth daily.    Marland Kitchen. atorvastatin (LIPITOR) 40 MG tablet Take 40 mg by mouth daily.    Marland Kitchen. gabapentin (NEURONTIN) 300 MG capsule Take 1 capsule (300 mg total) by mouth at bedtime.    . hydrALAZINE (APRESOLINE) 50 MG tablet Take 1 tablet (50 mg total) by mouth every 8 (eight) hours. 90 tablet 3  . insulin aspart (NOVOLOG) 100 UNIT/ML injection  Inject into the skin 3 (three) times daily before meals. Per sliding scale    . insulin detemir (LEVEMIR) 100 UNIT/ML injection Inject 0.3 mLs (30 Units total) into the skin at bedtime. 10 mL 11  . isosorbide mononitrate (IMDUR) 60 MG 24 hr tablet Take 1 tablet (60 mg total) by mouth daily. 30 tablet 6  . levothyroxine (SYNTHROID, LEVOTHROID) 25 MCG tablet Take 25 mcg by mouth daily before breakfast.    . Magnesium 200 MG TABS Take 200 mg by mouth daily.    . milrinone (PRIMACOR) 20 MG/100 ML SOLN infusion Inject 22.575 mcg/min into the vein continuous. 200 mL 12  . Multiple Vitamin (MULTIVITAMIN WITH MINERALS) TABS tablet Take 1 tablet by mouth at bedtime.    . nitroGLYCERIN (NITROSTAT) 0.4 MG SL tablet Place 1 tablet (0.4 mg total) under the tongue every 5 (five) minutes as needed for chest pain (up to 3 doses).    . ondansetron (ZOFRAN) 4 MG tablet Take 4 mg by mouth every 6 (six) hours as needed for nausea or vomiting.    . polyethylene glycol (MIRALAX / GLYCOLAX) packet Take 17 g by mouth daily.    . potassium chloride (K-DUR,KLOR-CON) 20 MEQ tablet Take 1 tablet (20 mEq total) by mouth daily. 30 tablet 6  . sertraline (ZOLOFT) 100 MG tablet Take 50 mg by mouth at bedtime.     Marland Kitchen. spironolactone (ALDACTONE) 25 MG tablet Take 0.5 tablets (12.5 mg total) by mouth daily. 30 tablet 6  . torsemide (DEMADEX) 20 MG tablet Take 2 tablets (40 mg total) by mouth daily. 60 tablet 6  . traMADol (ULTRAM) 50 MG tablet Take 1 tablet (50 mg total) by mouth every 12 (twelve) hours as needed for moderate pain. 30 tablet 0   No current facility-administered medications for this encounter.    Physical exam:  BP 112/62 mmHg  Pulse 137  Wt 194 lb 6.4 oz (88.179 kg)  SpO2 95%   Wt Readings from Last 3 Encounters:  02/24/16 194 lb 6.4 oz (88.179 kg)  02/18/16 190 lb 8 oz (86.41 kg)  02/11/16 184 lb 4.8 oz (83.598 kg)      General: NAD Neck: JVP 8-9 cm, no thyromegaly or thyroid nodule.  Lungs: Mildly decreased basilar sounds CV: Nondisplaced PMI. Heart regular S1/S2, no S3/S4, 2/6 early SEM RUSB. 1+ edema. No carotid bruit. Normal pedal pulses.  Abdomen: Soft, NT, mildly distended, no HSM. No bruits or masses. +BS  Skin: Intact without lesions or rashes.  Neurologic: Alert and oriented x 3.  Psych: Normal affect. Extremities: No clubbing or cyanosis.  HEENT: Normal.   Assessment/Plan: 1. Acute on chronic systolic CHF: Ischemic cardiomyopathy. LVEF 25-30% Recent admission with identification of low output heart failure. Unfortunately, it appears at this point that he is end-stage. He advanced renal disease precludes LVAD placement and he is  of too advanced age for transplant. We were unable to titrate him off milrinone during recent admission, he remains on milrinone at 0.25. -NYHA III on milrinone  - He is up 10 lbs from discharge. And having long runs of NSVT on EKG. He needs to be admitted for diuresis and to check electrolytes. May need to wean milrinone. He does not have an ICD  - Continue hydralazine 50 mg tid, continue Imdur 60 daily.  - No ACEI/ARB due to CKD stage IV.  - Holding off on beta blocker for right now with low output/on milrinone.  - Continue spironolactone 12.5 mg daily . 2. CAD: S/p CABG. No chest pain. Concerned that fall in EF recently may coincide to worsening CAD. However, he has not had chest pain concerning for ACS, so given CKD stage IV, would hold off on coronary angiography.  - Continue ASA 81 and atorvastatin.  3. PVCs/NSVT: Noted in hospital while on milrinone. Amiodarone was started to try to suppress. He is not an ICD candidate with end-stage CHF requiring milrinone.  - Worse today with 13 beat run of NSVT on EKG admission as above. Check K and Mg 4. CKD: Stage IV. Follows with Dr Marisue Humble.  5. Aortic stenosis: by last echo, AS appears to be  mild to moderate.  6. Mildly elevated TSH - He was recently started on synthroid and pt is adamant that this is what is causing his symptoms. We will stop this, despite length conversation that pts heart failure is what is causing his problems.   We will admit today for diuresis and monitoring of his NSVT. He has very poor prognosis. Will likely need hospice if he refuses SNF moving forward.   We had lengthy discussion with patient about the seriousness of his degree of disease and his risk of sudden cardiac death. He initially refused admission and any medication changes Tristan Baker "Otilio Saber, [place] 02/24/2016 2:40 PM   Patient seen and examined with Otilio Saber, PA-C. We discussed all aspects of the encounter. I agree with the assessment and plan as stated above.   Very difficult interaction with him. He is belligerent and has very little insight into how bad his HF is. He insists that the only reason he doesn't feel well is due to the pill that was started at [facility]. He also states that his fluid is better now than it was at a discharge so we "obviously don't know what we are doing." He has worsening ventricular ectopy on milrinone despite amiodarone. I tried to discuss with him about how severe his HF was and how he needed to be admitted due to HF and VT. He kept perseverating on the pill they gave him at [facility] and initially refused to be admitted. He finally agreed to be admitted but I told him that I could no longer provide care for him due to his attitude toward our team. [facility] will assume care until Dr. Shirlee Latch returns.   Allien Melberg,MD 4:09 PM

## 2016-02-24 NOTE — Progress Notes (Signed)
[facility]  Patient Status: Active pt with [facility] prior to this admission  [facility] is providing the following services:  Pt is a resident at [facility].  [facility] provides Mr. Tristan Baker's Milrinone while he is a resident there. [facility] will follow Mr. Tristan Baker to support transition to SNF or home upon DC based on MD team and family decision.   If patient discharges after hours, please call [phone].   Sedalia Mutaamela S Chandler 02/24/2016, 4:54 PM

## 2016-02-24 NOTE — Progress Notes (Signed)
Advanced Heart Failure Medication Review by a Pharmacist  Does the patient  feel that his/her medications are working for him/her?  yes  Has the patient been experiencing any side effects to the medications prescribed?  no  Does the patient measure his/her own blood pressure or blood glucose at home?  Nursing facility    Does the patient have any problems obtaining medications due to transportation or finances?   no  Understanding of regimen: fair Understanding of indications: fair Potential of compliance: Nursing home administration  Patient understands to avoid NSAIDs. Patient understands to avoid decongestants.  Pharmacist comments: Mr. Tristan Baker is a [age] yom presenting from nursing home for a follow-up visit. Medication list [facility]. No medication-related discrepancies were found.   Time with patient: 0 min  Preparation and documentation time: 15 min  Total time: 15 min

## 2016-02-25 ENCOUNTER — Telehealth (HOSPITAL_COMMUNITY): Payer: Self-pay

## 2016-02-25 ENCOUNTER — Inpatient Hospital Stay (HOSPITAL_COMMUNITY): Payer: Medicare Other

## 2016-02-25 DIAGNOSIS — N184 Chronic kidney disease, stage 4 (severe): Secondary | ICD-10-CM

## 2016-02-25 LAB — GLUCOSE, CAPILLARY
GLUCOSE-CAPILLARY: 171 mg/dL — AB (ref 65–99)
GLUCOSE-CAPILLARY: 182 mg/dL — AB (ref 65–99)
Glucose-Capillary: 147 mg/dL — ABNORMAL HIGH (ref 65–99)

## 2016-02-25 LAB — CARBOXYHEMOGLOBIN
CARBOXYHEMOGLOBIN: 1.6 % — AB (ref 0.5–1.5)
METHEMOGLOBIN: 0.8 % (ref 0.0–1.5)
O2 SAT: 42.9 %
Total hemoglobin: 11.8 g/dL — ABNORMAL LOW (ref 13.5–18.0)

## 2016-02-25 LAB — BASIC METABOLIC PANEL
Anion gap: 10 (ref 5–15)
BUN: 58 mg/dL — ABNORMAL HIGH (ref 6–20)
CALCIUM: 9 mg/dL (ref 8.9–10.3)
CO2: 27 mmol/L (ref 22–32)
CREATININE: 3.36 mg/dL — AB (ref 0.61–1.24)
Chloride: 98 mmol/L — ABNORMAL LOW (ref 101–111)
GFR calc Af Amer: 19 mL/min — ABNORMAL LOW (ref >60)
GFR calc non Af Amer: 17 mL/min — ABNORMAL LOW (ref >60)
GLUCOSE: 151 mg/dL — AB (ref 65–99)
Potassium: 4.4 mmol/L (ref 3.5–5.1)
Sodium: 135 mmol/L (ref 135–145)

## 2016-02-25 LAB — TROPONIN I: Troponin I: 0.18 ng/mL — ABNORMAL HIGH (ref <0.031)

## 2016-02-25 MED ORDER — SODIUM CHLORIDE 0.9% FLUSH
10.0000 mL | INTRAVENOUS | Status: DC | PRN
  Administered 2016-02-28 – 2016-03-01 (×3): 10 mL
  Filled 2016-02-25 (×3): qty 40

## 2016-02-25 MED ORDER — ENOXAPARIN SODIUM 30 MG/0.3ML ~~LOC~~ SOLN
30.0000 mg | SUBCUTANEOUS | Status: DC
  Administered 2016-02-25 – 2016-02-29 (×5): 30 mg via SUBCUTANEOUS
  Filled 2016-02-25 (×5): qty 0.3

## 2016-02-25 MED ORDER — INSULIN ASPART 100 UNIT/ML ~~LOC~~ SOLN
3.0000 [IU] | Freq: Three times a day (TID) | SUBCUTANEOUS | Status: DC
  Administered 2016-02-26 – 2016-03-01 (×12): 3 [IU] via SUBCUTANEOUS

## 2016-02-25 NOTE — Progress Notes (Addendum)
Patient Name: Tristan Baker Date of Encounter: 02/25/2016   SUBJECTIVE  Feels weak and dyspneic. No chest pain however complains of bilateral shoulder discomfort.   CURRENT MEDS . amiodarone  200 mg Oral BID  . aspirin EC  81 mg Oral Daily  . atorvastatin  40 mg Oral q1800  . enoxaparin (LOVENOX) injection  40 mg Subcutaneous Q24H  . furosemide  80 mg Intravenous BID  . gabapentin  300 mg Oral QHS  . hydrALAZINE  50 mg Oral Q8H  . insulin aspart  3 Units Subcutaneous TID WC  . insulin detemir  30 Units Subcutaneous QHS  . isosorbide mononitrate  60 mg Oral Daily  . magnesium oxide  400 mg Oral Daily  . multivitamin with minerals  1 tablet Oral QHS  . polyethylene glycol  17 g Oral Daily  . potassium chloride  20 mEq Oral Daily  . sertraline  50 mg Oral QHS  . sodium chloride flush  3 mL Intravenous Q12H  . spironolactone  12.5 mg Oral Daily    OBJECTIVE  Filed Vitals:   02/24/16 1618 02/24/16 1945 02/24/16 2005 02/25/16 0523  BP: 130/70 96/81 103/70 107/72  Pulse: 81   105  Temp: 98.9 F (37.2 C) 98.1 F (36.7 C)  99.3 F (37.4 C)  TempSrc: Oral Oral  Oral  Resp: Height:  (1.702 m)     Weight: 190 lb 12.8 oz (86.546 kg)   189 lb 3.2 oz (85.821 kg)  SpO2: 96% 97%  99%    Intake/Output Summary (Last 24 hours) at 02/25/16 1146 Last data filed at 02/25/16 1026  Gross per 24 hour  Intake 687.94 ml  Output    501 ml  Net 186.94 ml   Filed Weights   02/24/16 1618 02/25/16 0523  Weight: 190 lb 12.8 oz (86.546 kg) 189 lb 3.2 oz (85.821 kg)    PHYSICAL EXAM  General: Pleasant, NAD. Neuro: Alert and oriented X 3. Moves all extremities spontaneously. Psych: Normal affect. HEENT:  Normal  Neck: Supple without bruits. +  JVD. Lungs:  Resp regular and unlabored, diminished breath sound bibasilar Heart: RRR no s3, s4, systolic murmur Abdomen: Soft, non-tender, distended, BS + x 4.  Extremities: No clubbing, cyanosis. 1+ edema. DP/PT/Radials 2+  and equal bilaterally.  Accessory Clinical Findings  CBC  Recent Labs  02/24/16 1400  WBC 7.3  HGB 11.5*  HCT 36.3*  MCV 92.4  PLT 193   Basic Metabolic Panel  Recent Labs  02/24/16 1400 02/24/16 1701 02/25/16 0436  NA 134*  --  135  K 4.8  --  4.4  CL 101  --  98*  CO2 21*  --  27  GLUCOSE 139*  --  151*  BUN 56*  --  58*  CREATININE 3.14*  --  3.36*  CALCIUM 9.2  --  9.0  MG  --  2.1  --    Liver Function Tests No results for input(s): AST, ALT, ALKPHOS, BILITOT, PROT, ALBUMIN in the last 72 hours. No results for input(s): LIPASE, AMYLASE in the last 72 hours. Cardiac Enzymes  Recent Labs  02/24/16 1701 02/24/16 2251 02/25/16 0436  TROPONINI 0.15* 0.18* 0.18*    TELE  Sinus rhythm with frequent wide complex tachycardia  Radiology/Studies  Dg Chest Port 1 View  02/24/2016  CLINICAL DATA:  Evaluate PICC line position. Congestive heart failure. EXAM: PORTABLE CHEST 1 VIEW COMPARISON:  02/07/2016 and 01/05/2016. FINDINGS: 1859 hours. The  previously demonstrated right arm PICC has significantly retracted to the level of the mid right clavicle, probably in the right subclavian vein. There is stable cardiomegaly status post CABG. There is improved aeration of the lung bases with mild residual right basilar pulmonary opacity. No significant pleural effusion or edema seen. There is a subtle lucency projecting lateral to the right upper lobe which could reflect a small pneumothorax or overlying skin fold. IMPRESSION: 1. Interval significant retraction of the right arm PICC into the right subclavian vein. PICC line repositioning recommended. 2. Interval improvement in the aeration of the lung bases. Cannot exclude a small right-sided pneumothorax. 3. These results were called by telephone at the time of interpretation on 02/24/2016 at 6:33 pm to patient's nurse, Martie LeeSabrina, who verbally acknowledged these results. Electronically Signed   By: Carey BullocksWilliam  Veazey M.D.   On:  02/24/2016 18:35   Dg Chest Port 1 View  02/07/2016  CLINICAL DATA:  Distant heart failure. Bilateral lower extremity edema. EXAM: PORTABLE CHEST 1 VIEW COMPARISON:  01/05/2016 FINDINGS: Right PICC line tip:  SVC. Mild enlargement of the cardiopericardial silhouette and prior CABG. Bibasilar airspace opacities with poor definition of the hemidiaphragms, at least partially due to small bilateral pleural effusions. Mild airway thickening. Aortic arch atherosclerosis.  Thoracic spondylosis. IMPRESSION: 1. Mild enlargement of the cardiopericardial silhouette. 2. Bilateral pleural effusions. Right greater than left basilar airspace opacity potentially from atelectasis or pneumonia. 3. New right PICC line tip: SVC. 4. Atherosclerotic aortic arch. 5. Airway thickening is present, suggesting bronchitis or reactive airways disease. Electronically Signed   By: Gaylyn RongWalter  Liebkemann M.D.   On: 02/07/2016 14:02    ASSESSMENT AND PLAN  General cardiology will follow the patient until Dr. Shirlee LatchMcLean returns  1. Acute on chronic systolic CHF: Ischemic cardiomyopathy. LVEF 25-30% on echo 01/18/16.  - PICC line changed today. On Milrinone GTT. He is up 10 lbs from discharge. He is +12286ml since admission however weight loss  1 lb.  - No ACEI/ARB due to CKD stage IV. Holding off on beta blocker for right now with low output/on milrinone.  - Continue spironolactone 12.5 mg daily lasix 80mg  IV BID,  hydralazine 50 mg tid and Imdur 60 daily.   2. CAD: S/p CABG. No chest pain. Concerned that fall in EF recently may coincide to worsening CAD. However, he has not had chest pain concerning for ACS, so given CKD stage IV, would hold off on coronary angiography.  - Continue ASA 81 and atorvastatin.   3. PVCs/NSVT: Noted in hospital while on milrinone. Amiodarone was started to try to suppress. He is not an ICD candidate with end-stage CHF requiring milrinone.  Now having long runs of frequent NSVT/PVCs on telemetry. Mg and  K are normal.  May need to wean milrinone. He does not have an ICD. Increase amiodarone. Will review with MD.   4. CKD: Stage IV. Follows with Dr Marisue HumbleSanford.   5. Aortic stenosis: by last echo, AS appears to be mild to moderate.   6. Mildly elevated TSH  7. Elevated troponin - Flat trent. No chest pain. Likely demand in setting of kidney diease and volume overload.   Lorelei PontSigned, Bhagat,Bhavinkumar PA-C Pager [phone]  Attending Note:   The patient was seen and examined.  Agree with assessment and plan as noted above.  Changes made to the above note as needed.  Patient seen and independently examined with Chelsea AusVin Bhagat, PA .   We discussed all aspects of the encounter. I agree with the  assessment and plan as stated above.  1. Chronic systolic CHF Pt has end stage CHF. Is on continuous Milrinone drip.  BP is marginal  I/o are +  Will get a Co ox  Not much room to diurese further  Elevated Troponin is likely due to his CHF   2. CKD . Stage IV:   Creatinine continues to increase Following  I've discussed the case with Dr. Kathrene Bongo.    There is nothing more that they can offer this patient with end stage heart disease.    The patient has end stage CHF  Complicated by stage IV CKD. We should consider a Hospice and palliative care consult.    I have spent a total of 40 minutes with patient reviewing hospital  notes , telemetry, EKGs, labs and examining patient as well as establishing an assessment and plan that was discussed with the patient. > 50% of time was spent in direct patient care.    Vesta Mixer, Montez Hageman., MD, [facility] 02/25/2016, 1:14 PM [address]  [address] Office [phone] Pager [phone]

## 2016-02-25 NOTE — Progress Notes (Signed)
Inpatient Diabetes Program Recommendations  AACE/ADA: New Consensus Statement on Inpatient Glycemic Control (2015)  Target Ranges:  Prepandial:   less than 140 mg/dL      Peak postprandial:   less than 180 mg/dL (1-2 hours)      Critically ill patients:  140 - 180 mg/dL   Lab Results  Component Value Date   GLUCAP 147* 02/25/2016   HGBA1C 9.4* 02/05/2016    Review of Glycemic Control  Inpatient Diabetes Program Recommendations:  Correction (SSI): add Novolog sensitive scale TID Thank you  Piedad ClimesGina Veeda Virgo MSN, RN,CDE Inpatient Diabetes Coordinator [phone] (team pager)

## 2016-02-25 NOTE — Clinical Social Work Note (Signed)
CSW went into patient's room for assessment and to discuss discharge planning being that he is from [facility] and reportedly does not want to go back. Patient said this wasn't a good time because he was not feeling well. Will follow up later.  Charlynn CourtSarah Eathen Budreau, CSW [phone]

## 2016-02-25 NOTE — Care Management Important Message (Signed)
Important Message  Patient Details  Name: Corliss SkainsFrank Pham MRN: [id number] Date of Birth: 10/07/1940   Medicare Important Message Given:  Yes    Bernadette HoitShoffner, Kirah Stice Coleman 02/25/2016, 9:42 AM

## 2016-02-25 NOTE — Telephone Encounter (Signed)
Kim green [facility] Quality Naval architectAssurance Manager called to get updates on patient after OV yesterday resulted in admission to hospital. Informed her of weight gain since last discharge, NSVT runs captured on ekg during OV, and how patient and wife had c/o not feeling well during recent triage calls to our office, however, staff at [facility] reported him doing very well without complaints. Also made aware of patient's lack of PICC line care while at [facility].  Per patient, line had not been flushed, dressing was also not being changed.  Coox was not obtainable two weeks in a row due to failure to get drawback. Kim aware and appreciative of info.  Ave FilterBradley, Megan Genevea

## 2016-02-26 LAB — BASIC METABOLIC PANEL
ANION GAP: 8 (ref 5–15)
BUN: 58 mg/dL — AB (ref 6–20)
CALCIUM: 9 mg/dL (ref 8.9–10.3)
CO2: 27 mmol/L (ref 22–32)
CREATININE: 3.72 mg/dL — AB (ref 0.61–1.24)
Chloride: 100 mmol/L — ABNORMAL LOW (ref 101–111)
GFR calc Af Amer: 17 mL/min — ABNORMAL LOW (ref >60)
GFR, EST NON AFRICAN AMERICAN: 15 mL/min — AB (ref >60)
GLUCOSE: 117 mg/dL — AB (ref 65–99)
Potassium: 4 mmol/L (ref 3.5–5.1)
Sodium: 135 mmol/L (ref 135–145)

## 2016-02-26 LAB — CARBOXYHEMOGLOBIN
Carboxyhemoglobin: 1.7 % — ABNORMAL HIGH (ref 0.5–1.5)
Methemoglobin: 0.8 % (ref 0.0–1.5)
O2 Saturation: 55.7 %
Total hemoglobin: 11.2 g/dL — ABNORMAL LOW (ref 13.5–18.0)

## 2016-02-26 LAB — GLUCOSE, CAPILLARY
GLUCOSE-CAPILLARY: 178 mg/dL — AB (ref 65–99)
GLUCOSE-CAPILLARY: 159 mg/dL — AB (ref 65–99)
GLUCOSE-CAPILLARY: 187 mg/dL — AB (ref 65–99)
Glucose-Capillary: 142 mg/dL — ABNORMAL HIGH (ref 65–99)

## 2016-02-26 MED ORDER — PROMETHAZINE HCL 25 MG/ML IJ SOLN
12.5000 mg | Freq: Once | INTRAMUSCULAR | Status: AC | PRN
  Administered 2016-02-26: 12.5 mg via INTRAVENOUS
  Filled 2016-02-26: qty 1

## 2016-02-26 MED ORDER — GLUCERNA SHAKE PO LIQD
237.0000 mL | Freq: Two times a day (BID) | ORAL | Status: DC
  Administered 2016-02-27 – 2016-03-01 (×5): 237 mL via ORAL

## 2016-02-26 NOTE — Progress Notes (Signed)
Patient Name: Tristan Baker Date of Encounter: 02/26/2016   SUBJECTIVE  Feels weak and dyspneic. No chest pain however complains of bilateral shoulder discomfort.   Saw patient twice this am.  He remains short of breath with minimal / no exertion Discussed with his wife the fact that we may need to consider comfort care  CURRENT MEDS . amiodarone  200 mg Oral BID  . aspirin EC  81 mg Oral Daily  . atorvastatin  40 mg Oral q1800  . enoxaparin (LOVENOX) injection  30 mg Subcutaneous Q24H  . feeding supplement (GLUCERNA SHAKE)  237 mL Oral BID BM  . furosemide  80 mg Intravenous BID  . gabapentin  300 mg Oral QHS  . hydrALAZINE  50 mg Oral Q8H  . insulin aspart  3 Units Subcutaneous TID WC  . insulin detemir  30 Units Subcutaneous QHS  . isosorbide mononitrate  60 mg Oral Daily  . magnesium oxide  400 mg Oral Daily  . multivitamin with minerals  1 tablet Oral QHS  . polyethylene glycol  17 g Oral Daily  . potassium chloride  20 mEq Oral Daily  . sertraline  50 mg Oral QHS  . sodium chloride flush  3 mL Intravenous Q12H  . spironolactone  12.5 mg Oral Daily    OBJECTIVE  Filed Vitals:   02/25/16 2100 02/26/16 0100 02/26/16 0500 02/26/16 1141  BP: 95/56 111/66 115/66 113/46  Pulse: 110 99 101 96  Temp: 98.9 F (37.2 C) 98.5 F (36.9 C) 98.2 F (36.8 C) 98.2 F (36.8 C)  TempSrc: Oral Oral Oral Oral  Resp: 18 18 20 16   Height:      Weight:   188 lb 7.9 oz (85.5 kg)   SpO2: 93% 94% 94% 93%    Intake/Output Summary (Last 24 hours) at 02/26/16 1354 Last data filed at 02/26/16 1141  Gross per 24 hour  Intake  389.6 ml  Output    825 ml  Net -435.4 ml   Filed Weights   02/24/16 1618 02/25/16 0523 02/26/16 0500  Weight: 190 lb 12.8 oz (86.546 kg) 189 lb 3.2 oz (85.821 kg) 188 lb 7.9 oz (85.5 kg)    PHYSICAL EXAM  General: Pleasant, NAD. Neuro: Alert and oriented X 3. Moves all extremities spontaneously. Psych: Normal affect. HEENT:  Normal  Neck: Supple  without bruits. +  JVD. Lungs:  Resp regular and unlabored, diminished breath sound bibasilar Heart: RRR no s3, s4, systolic murmur Abdomen: Soft, non-tender, distended, BS + x 4.  Extremities: No clubbing, cyanosis. 1+ edema. DP/PT/Radials 2+ and equal bilaterally.  Accessory Clinical Findings  CBC  Recent Labs  02/24/16 1400  WBC 7.3  HGB 11.5*  HCT 36.3*  MCV 92.4  PLT 193   Basic Metabolic Panel  Recent Labs  02/24/16 1701 02/25/16 0436 02/26/16 0510  NA  --  135 135  K  --  4.4 4.0  CL  --  98* 100*  CO2  --  27 27  GLUCOSE  --  151* 117*  BUN  --  58* 58*  CREATININE  --  3.36* 3.72*  CALCIUM  --  9.0 9.0  MG 2.1  --   --    Liver Function Tests No results for input(s): AST, ALT, ALKPHOS, BILITOT, PROT, ALBUMIN in the last 72 hours. No results for input(s): LIPASE, AMYLASE in the last 72 hours. Cardiac Enzymes  Recent Labs  02/24/16 1701 02/24/16 2251 02/25/16 0436  TROPONINI 0.15* 0.18* 0.18*  TELE  Sinus rhythm with frequent wide complex tachycardia  Radiology/Studies  Dg Chest Port 1 View  02/26/2016  CLINICAL DATA:  PICC line placement. EXAM: PORTABLE CHEST 1 VIEW COMPARISON:  02/24/2016 FINDINGS: Interval replacement or advancement of right PICC line. Tip now localizes to the lower esophagus. No visible pneumothorax. Postoperative changes in the mediastinum. Cardiac enlargement. Increasing opacity in the right mid and lower lung probably representing pneumonia or atelectasis. Probable small right pleural effusion. Left lung is grossly clear. IMPRESSION: Right PICC line tip localizes over the distal SVC. No pneumothorax. Increasing infiltration or atelectasis in the right lung with small right pleural effusion. Cardiac enlargement. Electronically Signed   By: Burman Nieves M.D.   On: 02/26/2016 02:31   Dg Chest Port 1 View  02/24/2016  CLINICAL DATA:  Evaluate PICC line position. Congestive heart failure. EXAM: PORTABLE CHEST 1 VIEW  COMPARISON:  02/07/2016 and 01/05/2016. FINDINGS: 1859 hours. The previously demonstrated right arm PICC has significantly retracted to the level of the mid right clavicle, probably in the right subclavian vein. There is stable cardiomegaly status post CABG. There is improved aeration of the lung bases with mild residual right basilar pulmonary opacity. No significant pleural effusion or edema seen. There is a subtle lucency projecting lateral to the right upper lobe which could reflect a small pneumothorax or overlying skin fold. IMPRESSION: 1. Interval significant retraction of the right arm PICC into the right subclavian vein. PICC line repositioning recommended. 2. Interval improvement in the aeration of the lung bases. Cannot exclude a small right-sided pneumothorax. 3. These results were called by telephone at the time of interpretation on 02/24/2016 at 6:33 pm to patient's nurse, Martie Lee, who verbally acknowledged these results. Electronically Signed   By: Carey Bullocks M.D.   On: 02/24/2016 18:35   Dg Chest Port 1 View  02/07/2016  CLINICAL DATA:  Distant heart failure. Bilateral lower extremity edema. EXAM: PORTABLE CHEST 1 VIEW COMPARISON:  01/05/2016 FINDINGS: Right PICC line tip:  SVC. Mild enlargement of the cardiopericardial silhouette and prior CABG. Bibasilar airspace opacities with poor definition of the hemidiaphragms, at least partially due to small bilateral pleural effusions. Mild airway thickening. Aortic arch atherosclerosis.  Thoracic spondylosis. IMPRESSION: 1. Mild enlargement of the cardiopericardial silhouette. 2. Bilateral pleural effusions. Right greater than left basilar airspace opacity potentially from atelectasis or pneumonia. 3. New right PICC line tip: SVC. 4. Atherosclerotic aortic arch. 5. Airway thickening is present, suggesting bronchitis or reactive airways disease. Electronically Signed   By: Gaylyn Rong M.D.   On: 02/07/2016 14:02    ASSESSMENT AND PLAN   General cardiology will follow the patient until Dr. Shirlee Latch returns  1. Acute on chronic systolic CHF: Ischemic cardiomyopathy. LVEF 25-30% on echo 01/18/16.  - PICC line changed  On Milrinone GTT.  Co-ox was 55.7 % yesterday  Renal function continues to deteriate.     2. CAD: S/p CABG. No chest pain. Concerned that fall in EF recently may coincide to worsening CAD. However, he has not had chest pain concerning for ACS, so given CKD stage IV, would hold off on coronary angiography.  - Continue ASA 81 and atorvastatin.   3. PVCs/NSVT: Noted in hospital while on milrinone. Amiodarone was started to try to suppress. He is not an ICD candidate with end-stage CHF requiring milrinone.  Now having long runs of frequent NSVT/PVCs on telemetry. Mg and K are normal.  May need to wean milrinone. He does not have an ICD. Increase amiodarone. Will  review with MD.   4. CKD: Stage IV. Follows with Dr Marisue HumbleSanford I discussed wth renal  ( dr. Ernst BreachGoldsborouth)  She does not think there is much to offer from a renal standpont   5. Aortic stenosis: by last echo, AS appears to be mild to moderate.   6. Mildly elevated TSH  7. Elevated troponin - Flat trent. No chest pain. Likely demand in setting of kidney diease and volume overload.    The patient has end stage CHF  Complicated by stage IV CKD. We should consider a Hospice and palliative care consult.    I have spent a total of 30 minutes with patient reviewing hospital  notes , telemetry, EKGs, labs and examining patient as well as establishing an assessment and plan that was discussed with the patient. > 50% of time was spent in direct patient care.    Vesta MixerPhilip J. Nahser, Montez HagemanJr., MD, [facility] 02/26/2016, 1:54 PM [address],  [address] Office [phone] Pager [phone]

## 2016-02-26 NOTE — Progress Notes (Signed)
Pt given zofran for N/V c/othat it  not relieved and want some phenergan .  Notified D. Dunn, PA. At 1845,  Instructed that she will put in the order.  Pt made aware.  Oncoming nurse made aware.  Amanda PeaNellie Kohei Antonellis, Charity fundraiserN.

## 2016-02-26 NOTE — Care Management Important Message (Signed)
Important Message  Patient Details  Name: Tristan Baker MRN: [id number] Date of Birth: 09/28/1940   Medicare Important Message Given:  Yes    Bernadette HoitShoffner, [name] 02/26/2016, 9:23 AM

## 2016-02-26 NOTE — Care Management Note (Addendum)
Case Management Note  Patient Details  Name: Corliss SkainsFrank Fite MRN: [id number] Date of Birth: 07/31/1941  Subjective/Objective:   Pt admitted with acute on chronic HF                 Action/Plan:  [facility]  Pt unsure if he wants to return however wife has concerns with pt returning home in current condtion - PT consulted for evaluation.  [facility] aware that pt may discharge home and will continue to follow .  Palliative Care consult being recommended by cardiology. CM will continue to monitor for discharge needs   Expected Discharge Date:                  Expected Discharge Plan:  Skilled Nursing Facility  In-House Referral:  Clinical Social Work  Discharge planning Services  CM Consult  Post Acute Care Choice:    Choice offered to:     DME Arranged:    DME Agency:     HH Arranged:    HH Agency:     Status of Service:  In process, will continue to follow  Medicare Important Message Given:  Yes Date Medicare IM Given:    Medicare IM give by:    Date Additional Medicare IM Given:    Additional Medicare Important Message give by:     If discussed at Long Length of Stay Meetings, dates discussed:    Additional Comments:  Cherylann ParrClaxton, Myesha Stillion S, RN 02/26/2016, 1:28 PM

## 2016-02-26 NOTE — Clinical Social Work Note (Signed)
Clinical Social Work Assessment  Patient Details  Name: Tristan Baker MRN: [id number] Date of Birth: May 30, 1941  Date of referral:  02/24/16               Reason for consult:  Discharge Planning                Permission sought to share information with:  Family Supports Permission granted to share information::  Yes, Verbal Permission Granted  Name::     Tristan Baker  Agency::     Relationship::  Spouse  Contact Information:  [phone]  Housing/Transportation Living arrangements for the past 2 months:  [place] of Information:  Spouse, Medical Team Patient Interpreter Needed:  None Criminal Activity/Legal Involvement Pertinent to Current Situation/Hospitalization:  No - Comment as needed Significant Relationships:  Adult Children, Spouse Lives with:  Spouse Do you feel safe going back to the place where you live?  Yes Need for family participation in patient care:  Yes (Comment)  Care giving concerns:  Patient is from [facility] but patient does not want to return. Patient wants to go home. Wife is not sure that he is ready to come home yet.   Social Worker assessment / plan:  CSW met with patient but he was asleep and did not arouse to voices. Patient's wife at bedside. CSW introduced role and explained that discharge planning would be discussed. Patient's wife confirmed that patient does not want to return to [facility] due to concerns over PICC line. Patient wants to return home but patient would likely be in need of hospice if so. Patient's wife is unsure if he is ready for that. PT has not yet evaluated. Patient's wife also has concerns over insurance costs. No further concerns. CSW encouraged patient's wife to contact CSW as needed. CSW will continue to follow patient and his wife and facilitate discharge to SNF if needed once medically stable. RNCM also following in case patient needs home services.  Employment status:  [employer]  information:  Medicare PT Recommendations:  Not assessed at this time Information / Referral to community resources:  [place]  Patient/Family's Response to care:  Patient sleeping but is reportedly not agreeable to SNF. Patient's wife supportive and involved in patient's care. Patient's wife polite and appreciated social work intervention.  Patient/Family's Understanding of and Emotional Response to Diagnosis, Current Treatment, and Prognosis:  Patient reportedly does not understand severity of his current prognosis. Patient's wife knowledgeable of medical interventions and aware of possibility for discharge to SNF or home once medically stable.  Emotional Assessment Appearance:  Appears stated age Attitude/Demeanor/Rapport:  Other (Irritable: Yesterday. Today asleep.) Affect (typically observed):  Agitated, Irritable (Yesterday. Today asleep.) Orientation:  Oriented to Self, Oriented to Place, Oriented to  Time, Oriented to Situation Alcohol / Substance use:  Never Used Psych involvement (Current and /or in the community):  No (Comment)  Discharge Needs  Concerns to be addressed:  Care Coordination Readmission within the last 30 days:  Yes Current discharge risk:  Dependent with Mobility, Other (Was at SNF before admission for 2 weeks but does not want to return.) Barriers to Discharge:  No Barriers Identified   Candie Chroman, LCSW 02/26/2016, 10:15 AM

## 2016-02-26 NOTE — Consult Note (Signed)
   [facility] CM [facility] Consult   02/26/2016  Corliss SkainsFrank Vanhook 05/22/1941 [id number]  Patient screened for potential [facility] Care Management services. Patient is eligible for [facility].  Admitted with HF exacerbation.   Electronic medical record reveals that the patient has been referred to  [facility] for possible IV Milrinone per inpatient RNCM notes and likely a palliative care consult.  Will follow chart for progress and needs.  For community care management needs please place a [facility] consult. For questions please contact:   Charlesetta ShanksVictoria Judyth Demarais, RN BSN CCM [facility] Liaison  [phone] business mobile phone Toll free office [phone]

## 2016-02-26 NOTE — Progress Notes (Signed)
Initial Nutrition Assessment  INTERVENTION:  Provide Glucerna Shake po BID, each supplement provides 220 kcal and 10 grams of protein Provide Snacks BID   NUTRITION DIAGNOSIS:   Inadequate oral intake related to nausea as evidenced by per patient/family report, meal completion < 50%.   GOAL:   Patient will meet greater than or equal to 90% of their needs   MONITOR:   PO intake, Supplement acceptance, Skin, Weight trends, Labs, I & O's  REASON FOR ASSESSMENT:   Consult Poor PO  ASSESSMENT:   75 yo with history of CAD s/p CABG, diabetes, prior DVT, CKD stage IV and ischemic cardiomyopathy presents with complaints of malaise per his wife, although he has been stable per [facility]..  Pt reports eating poorly for the past 3 days due to nausea and dry heaves. He reports eating a little [place] for dinner last night and ~50% of breakfast this morning. He states that usually he eats very well, maybe too much. Pt is agreeable to receiving snacks and Glucerna Shakes until appetite and nausea improves.   Labs: low GFR, elevated BUN/creatinine, glucose ranging 147 to 273 mg/dL  Diet Order:  Diet heart healthy/carb modified Room service appropriate?: Yes; Fluid consistency:: Thin  Skin:  Reviewed, no issues  Last BM:  6/15  Height:   Ht Readings from Last 1 Encounters:  02/24/16 5\' 7"  (1.702 m)    Weight:   Wt Readings from Last 1 Encounters:  02/26/16 188 lb 7.9 oz (85.5 kg)    Ideal Body Weight:  61.4 kg  BMI:  Body mass index is 29.52 kg/(m^2).  Estimated Nutritional Needs:   Kcal:  1900-2100  Protein:  85-95 grams  Fluid:   per MD  EDUCATION NEEDS:   No education needs identified at this time  Tristan Baker RD, LDN Inpatient Clinical Dietitian Pager: [phone] After Hours Pager: [phone]

## 2016-02-26 NOTE — Progress Notes (Signed)
Radiology called this RN last night in regards to post-PICC CXR. PICC line was placed at 0900. CXR obtained at 22:30 and showed correct placement. Will continue to monitor.

## 2016-02-27 ENCOUNTER — Inpatient Hospital Stay (HOSPITAL_COMMUNITY): Payer: Medicare Other

## 2016-02-27 DIAGNOSIS — R112 Nausea with vomiting, unspecified: Secondary | ICD-10-CM

## 2016-02-27 DIAGNOSIS — Z515 Encounter for palliative care: Secondary | ICD-10-CM | POA: Insufficient documentation

## 2016-02-27 DIAGNOSIS — Z7189 Other specified counseling: Secondary | ICD-10-CM

## 2016-02-27 LAB — BASIC METABOLIC PANEL
Anion gap: 11 (ref 5–15)
BUN: 69 mg/dL — AB (ref 6–20)
CALCIUM: 8.9 mg/dL (ref 8.9–10.3)
CO2: 26 mmol/L (ref 22–32)
CREATININE: 4.33 mg/dL — AB (ref 0.61–1.24)
Chloride: 96 mmol/L — ABNORMAL LOW (ref 101–111)
GFR calc non Af Amer: 12 mL/min — ABNORMAL LOW (ref >60)
GFR, EST AFRICAN AMERICAN: 14 mL/min — AB (ref >60)
GLUCOSE: 152 mg/dL — AB (ref 65–99)
Potassium: 4.4 mmol/L (ref 3.5–5.1)
Sodium: 133 mmol/L — ABNORMAL LOW (ref 135–145)

## 2016-02-27 LAB — GLUCOSE, CAPILLARY
GLUCOSE-CAPILLARY: 122 mg/dL — AB (ref 65–99)
GLUCOSE-CAPILLARY: 127 mg/dL — AB (ref 65–99)
GLUCOSE-CAPILLARY: 202 mg/dL — AB (ref 65–99)
GLUCOSE-CAPILLARY: 186 mg/dL — AB (ref 65–99)
Glucose-Capillary: 167 mg/dL — ABNORMAL HIGH (ref 65–99)

## 2016-02-27 LAB — CARBOXYHEMOGLOBIN
Carboxyhemoglobin: 1.2 % (ref 0.5–1.5)
Methemoglobin: 1 % (ref 0.0–1.5)
O2 Saturation: 60.3 %
Total hemoglobin: 10.6 g/dL — ABNORMAL LOW (ref 13.5–18.0)

## 2016-02-27 MED ORDER — FUROSEMIDE 10 MG/ML IJ SOLN
80.0000 mg | Freq: Four times a day (QID) | INTRAMUSCULAR | Status: DC
  Administered 2016-02-27 – 2016-02-29 (×7): 80 mg via INTRAVENOUS
  Filled 2016-02-27 (×7): qty 8

## 2016-02-27 NOTE — Progress Notes (Signed)
Pt's daughter requesting abdominal X-ray or CT due to concern for gastroparesis. Pt continues with nausea and dry heaving today. Daughter states the patient was having diarrhea prior to admission. Sticky note left for MDs. Will continue to monitor.

## 2016-02-27 NOTE — Evaluation (Signed)
Physical Therapy Evaluation Patient Details Name: Tristan Baker MRN: [id number] DOB: Jul 29, 1941 Today's Date: 02/27/2016   History of Present Illness  Pt is a 75 y/o M admitted from SNF w/ c/o mailaise.  Pt in stage IV systolic heart failure.  Pt's PMH includes DVT, MI, HOH, CHF, CKD stage IV, anxiety, depression, RBBB.    Clinical Impression  Pt admitted with above diagnosis. Pt currently with functional limitations due to the deficits listed below (see PT Problem List). Tristan Baker presents w/ a flat affect and c/o nausea.  His HR reaches 140 bpm sitting EOB; therefore, mobility limited to stand pivot.  He currently requires min assist to steady but anticipate that once medically stable pt has potential to reach mod I level of functioning w/ additional therapy.  Pt will benefit from skilled PT to increase their independence and safety with mobility to allow discharge to the venue listed below.      Follow Up Recommendations Home health PT;Supervision for mobility/OOB    Equipment Recommendations  None recommended by PT    Recommendations for Other Services OT consult     Precautions / Restrictions Precautions Precautions: Other (comment) Precaution Comments: monitor HR Restrictions Weight Bearing Restrictions: No      Mobility  Bed Mobility Overal bed mobility: Modified Independent             General bed mobility comments: Increased time and effort  Transfers Overall transfer level: Needs assistance Equipment used: 1 person hand held assist Transfers: Sit to/from Stand;Stand Pivot Transfers Sit to Stand: Min assist Stand pivot transfers: Min assist       General transfer comment: Min assist to steady  Ambulation/Gait             General Gait Details: deferred as pt's HR up to 140 when sitting EOB  Stairs            Wheelchair Mobility    Modified Rankin (Stroke Patients Only)       Balance Overall balance assessment: Needs  assistance Sitting-balance support: No upper extremity supported;Feet supported Sitting balance-Leahy Scale: Good     Standing balance support: Single extremity supported;During functional activity Standing balance-Leahy Scale: Poor Standing balance comment: UE support to steady                             Pertinent Vitals/Pain Pain Assessment: No/denies pain    Home Living Family/patient expects to be discharged to:: Private residence Living Arrangements: Spouse/significant other Available Help at Discharge: Family;Available 24 hours/day Type of Home: House Home Access: Stairs to enter Entrance Stairs-Rails: Can reach both;Right;Left Entrance Stairs-Number of Steps: 5 Home Layout: One level Home Equipment: Cane - single point      Prior Function Level of Independence: Needs assistance   Gait / Transfers Assistance Needed: Not using AD at SNF.  Has been using weight machines there.  ADL's / Homemaking Assistance Needed: Needs assist from wife for lower body and back bathing via sponge bath standing at sink.          Hand Dominance   Dominant Hand: Right    Extremity/Trunk Assessment   Upper Extremity Assessment: RUE deficits/detail RUE Deficits / Details: Rt shoulder flexion AROM limited to ~80 deg due to h/o Rt rotator cuff injury per pt         Lower Extremity Assessment: Overall WFL for tasks assessed         Communication   Communication: [facility]  Cognition Arousal/Alertness: Awake/alert Behavior During Therapy: Flat affect Overall Cognitive Status: Within Functional Limits for tasks assessed                      General Comments General comments (skin integrity, edema, etc.): Wife present during evaluation and very supportive    Exercises General Exercises - Lower Extremity Long Arc Quad: AROM;Both;10 reps;Seated      Assessment/Plan    PT Assessment Patient needs continued PT services  PT Diagnosis Difficulty walking   PT  Problem List Decreased activity tolerance;Decreased balance;Decreased knowledge of use of DME;Decreased safety awareness;Cardiopulmonary status limiting activity  PT Treatment Interventions DME instruction;Gait training;Stair training;Functional mobility training;Therapeutic activities;Balance training;Therapeutic exercise;Patient/family education   PT Goals (Current goals can be found in the Care Plan section) Acute Rehab PT Goals Patient Stated Goal: to get stronger PT Goal Formulation: With patient Time For Goal Achievement: 03/12/16 Potential to Achieve Goals: Good    Frequency Min 3X/week   Barriers to discharge Inaccessible home environment 5 steps to enter home    Co-evaluation               End of Session Equipment Utilized During Treatment: Oxygen Activity Tolerance: Treatment limited secondary to medical complications (Comment);Other (comment) (elevated HR; pt c/o nausea) Patient left: in chair;with call bell/phone within reach;with chair alarm set;with family/visitor present;with nursing/sitter in room Nurse Communication: Mobility status;Other (comment) (pt c/o nausea; RN aware of HR)         Time: [id number] PT Time Calculation (min) (ACUTE ONLY): 24 min   Charges:   PT Evaluation $PT Eval Low Complexity: 1 Procedure PT Treatments $Therapeutic Activity: 8-22 mins   PT G Codes:       Encarnacion ChuAshley Lewin Pellow PT, DPT  Pager: [phone] Phone: [phone] 02/27/2016, 1:38 PM

## 2016-02-27 NOTE — Consult Note (Signed)
Consultation Note Date: 02/27/2016   Patient Name: Tristan Baker  DOB: Dec 29, 1940  MRN: [id number]  Age / Sex: 75 y.o., male  PCP: Martha Clan, MD Referring Physician: Dolores Patty, MD  Reason for Consultation: Establishing goals of care  HPI/Patient Profile: 75 y.o. male   admitted on 02/24/2016 .   Clinical Assessment and Goals of Care:  75 year old woman with a past medical history significant for stage IV chronic congestive heart failure now admitted for acute on chronic systolic congestive heart failure. Patient has underlying coronary artery disease. Hospital course also complicated by worsening acute on chronic kidney disease. Patient's creatinine has risen from 3.362 4.33 throughout the course of this hospitalization. He is on milrinone. More recently, the patient has had nausea, abdominal pain, dry heaves. He is on Zofran when necessary. Palliative care consult her for goals of care discussions.  Patient is resting in bed. His wife Graciella Belton is present at the bedside. Patient initially stated that he was feeling no better and did not want to engage. Upon his wife [name]'s insistence, I was able to introduce myself. I introduced palliative medicine as follows: Palliative medicine is specialized medical care for people living with serious illness. It focuses on providing relief from the symptoms and stress of a serious illness. The goal is to improve quality of life for both the patient and the family.  Goals and values/wishes attempted to be elicited. Mr. Camacho would like to be home. Discussed about options for going home in great detail. Offered medical recommendation for hospice services. CODE STATUS discussions undertaken. Discussed how hospice can help and be an extra layer of support. Patient's wife's mother is currently enrolled in hospice in another state. The patient has episodic shortness of  breath. He's been having some diarrhea his last bowel movement was yesterday. His blood pressure is 98/54 his heart rate is 98 this morning. He was recently at [facility] facility. He is deemed not an appropriate candidate for ICD placement and not for transplantation.  Offered active listening and supportive care for the patient's wife [name]. It appears that she has caregiver burden. Discussed extensively about medical recommendation to establish CODE STATUS DO NOT RESUSCITATE/DO NOT INTUBATE. Discussed extensively about what going home with hospice support would entail. The patient at one point closed his eyes and looked away. He stated that he could not make a decision today. Wife Graciella Belton would like to discuss with him and her daughter later today. She has asked that palliative care come back within 24-48 hours for follow-up discussions. We will continue to follow along.  NEXT OF KIN  Wife, daughter  SUMMARY OF RECOMMENDATIONS    Remains full code for now. Remains on milrinone for now. Initial consult dictation completed. Attempting to establish trust relationship with patient/family to further facilitate appropriate medical decision-making and appropriate disposition arrangements. Will continue to follow along. See above. Thank you for the consult. Code Status/Advance Care Planning:  Full code    Symptom Management:    continue current treatment  Palliative Prophylaxis:  Bowel Regimen   Psycho-social/Spiritual:   Desire for further Chaplaincy support:no  Additional Recommendations: Education on Hospice  Prognosis:   < 6 months  Discharge Planning: To Be Determined      Primary Diagnoses: Present on Admission:  . Acute on chronic systolic CHF (congestive heart failure) (HCC)  I have reviewed the medical record, interviewed the patient and family, and examined the patient. The following aspects are pertinent.  Past Medical History  Diagnosis Date  . Hypertension      INTERMITTENT NONCOMPLIANCE  . Obesity   . OA (osteoarthritis)   . DVT (deep venous thrombosis) (HCC)   . 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  . RBBB (right bundle branch block)   . Hyperlipidemia   . Anxiety   . Depression   . Decreased hearing   . Coronary artery disease     a. 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 b. s/p DES of SVG-OM 12/11/12.  . Diabetes mellitus     type 2   . LV dysfunction     EF 45% by echo 12/11/12  . HOH (hard of hearing)   . CHF (congestive heart failure) (HCC)   . Shortness of breath dyspnea   . CKD (chronic kidney disease), stage IV (HCC)     Hattie Perch 02/24/2016   Social History   Social History  . Marital Status: Married    Spouse Name: Graciella Belton  . Number of Children: 4  . Years of Education: college   Occupational History  .       [employer]   Social History Main Topics  . Smoking status: Never Smoker   . Smokeless tobacco: Never Used  . Alcohol Use: No  . Drug Use: No  . Sexual Activity: Not Currently   Other Topics Concern  . None   Social History Narrative   Patient lives at home with his spouse.   Caffeine Use: 2 cups daily; ice tea or soda daily   Family History  Problem Relation Age of Onset  . Stroke Mother   . Heart disease Father    Scheduled Meds: . aspirin EC  81 mg Oral Daily  . enoxaparin (LOVENOX) injection  30 mg Subcutaneous Q24H  . feeding supplement (GLUCERNA SHAKE)  237 mL Oral BID BM  . furosemide  80 mg Intravenous Q6H  . gabapentin  300 mg Oral QHS  . hydrALAZINE  50 mg Oral Q8H  . insulin aspart  3 Units Subcutaneous TID WC  . insulin detemir  30 Units Subcutaneous QHS  . isosorbide mononitrate  60 mg Oral Daily  . magnesium oxide  400 mg Oral Daily  . multivitamin with minerals  1 tablet Oral QHS  . polyethylene glycol  17 g Oral Daily  . potassium chloride  20 mEq Oral Daily  . sertraline  50 mg Oral QHS  . sodium chloride flush  3 mL Intravenous Q12H   Continuous Infusions: . milrinone 0.25  mcg/kg/min (02/27/16 0419)   PRN Meds:.sodium chloride, acetaminophen, nitroGLYCERIN, ondansetron (ZOFRAN) IV, sodium chloride flush, sodium chloride flush, traMADol Medications Prior to Admission:  Prior to Admission medications   Medication Sig Start Date End Date Taking? Authorizing Provider  amiodarone (PACERONE) 200 MG tablet Take 200 mg by mouth daily.   Yes Historical Provider, MD  aspirin 81 MG tablet Take 1 tablet (81 mg total) by mouth daily. 12/12/12  Yes Dayna N Dunn, PA-C  atorvastatin (LIPITOR) 40 MG tablet Take 40 mg by  mouth daily at 6 PM.    Yes Historical Provider, MD  gabapentin (NEURONTIN) 300 MG capsule Take 1 capsule (300 mg total) by mouth at bedtime. 06/05/14  Yes Lewayne BuntingBrian S Crenshaw, MD  hydrALAZINE (APRESOLINE) 50 MG tablet Take 50 mg by mouth 3 (three) times daily.   Yes Historical Provider, MD  insulin aspart (NOVOLOG) 100 UNIT/ML injection Inject 0-15 Units into the skin 3 (three) times daily before meals. Per sliding scale for morning and noon dose: CBG 0-100 0 units, 101-150 3 units, 151-200 5 units, 201-250 7 units, 251-300 9 units, 301-350 12 units, >300 15 units, >400 call MD   for night dose: CBG 201-250 2 units, 251-300 3 units, 301-350 4 units, >350 5 units, >400 call MD   Yes Historical Provider, MD  insulin detemir (LEVEMIR) 100 UNIT/ML injection Inject 0.3 mLs (30 Units total) into the skin at bedtime. 02/11/16  Yes Amy D Clegg, NP  isosorbide mononitrate (IMDUR) 60 MG 24 hr tablet Take 1 tablet (60 mg total) by mouth daily. 02/11/16  Yes Amy D Clegg, NP  levothyroxine (SYNTHROID, LEVOTHROID) 25 MCG tablet Take 25 mcg by mouth daily before breakfast.   Yes Historical Provider, MD  Magnesium 200 MG TABS Take 200 mg by mouth daily.   Yes Historical Provider, MD  milrinone (PRIMACOR) 20 MG/100 ML SOLN infusion Inject 22.575 mcg/min into the vein continuous. 02/11/16  Yes Amy D Clegg, NP  Multiple Vitamin (MULTIVITAMIN WITH MINERALS) TABS tablet Take 1 tablet by mouth at  bedtime.   Yes Historical Provider, MD  nitroGLYCERIN (NITROSTAT) 0.4 MG SL tablet Place 1 tablet (0.4 mg total) under the tongue every 5 (five) minutes as needed for chest pain (up to 3 doses). 12/12/12  Yes Dayna N Dunn, PA-C  ondansetron (ZOFRAN) 4 MG tablet Take 4 mg by mouth every 6 (six) hours as needed for nausea or vomiting.   Yes Historical Provider, MD  OXYGEN Inhale 2 L into the lungs See admin instructions. Inhale daily at bedtime and as needed for shortness of breath   Yes Historical Provider, MD  polyethylene glycol (MIRALAX / GLYCOLAX) packet Take 17 g by mouth daily. Mix in 8 oz of fluid and drink   Yes Historical Provider, MD  potassium chloride (K-DUR,KLOR-CON) 20 MEQ tablet Take 1 tablet (20 mEq total) by mouth daily. 02/11/16  Yes Amy D Clegg, NP  sertraline (ZOLOFT) 100 MG tablet Take 50 mg by mouth at bedtime.    Yes Historical Provider, MD  spironolactone (ALDACTONE) 25 MG tablet Take 0.5 tablets (12.5 mg total) by mouth daily. 02/11/16  Yes Amy D Clegg, NP  torsemide (DEMADEX) 20 MG tablet Take 2 tablets (40 mg total) by mouth daily. 02/11/16  Yes Amy D Clegg, NP  traMADol (ULTRAM) 50 MG tablet Take 1 tablet (50 mg total) by mouth every 12 (twelve) hours as needed for moderate pain. 02/11/16  Yes Amy Georgie Chard Clegg, NP   Allergies  Allergen Reactions  . Choline Fenofibrate Other (See Comments)    trilipix - pt does not recall this reaction but remembers that he did not want to take it again   Review of Systems  Physical Exam Age-appropriate appearing gentleman resting in bed S1-S2 Diminished breath sounds Edema Awake alert nonfocal  Vital Signs: BP 90/60 mmHg  Pulse 96  Temp(Src) 98.3 F (36.8 C) (Oral)  Resp 18  Ht 5\' 7"  (1.702 m)  Wt 85.911 kg (189 lb 6.4 oz)  BMI 29.66 kg/m2  SpO2 98% Pain  Assessment: No/denies pain   Pain Score: 0-No pain   SpO2: SpO2: 98 % O2 Device:SpO2: 98 % O2 Flow Rate: .   IO: Intake/output summary:  Intake/Output Summary (Last 24 hours)  at 02/27/16 1438 Last data filed at 02/27/16 1400  Gross per 24 hour  Intake    800 ml  Output    475 ml  Net    325 ml    LBM: Last BM Date: 02/25/16 Baseline Weight: Weight: 86.546 kg (190 lb 12.8 oz) Most recent weight: Weight: 85.911 kg (189 lb 6.4 oz)     Palliative Assessment/Data:   Flowsheet Rows        Most Recent Value   Intake Tab    Referral Department  Cardiology   Unit at Time of Referral  Cardiac/Telemetry Unit   Palliative Care Primary Diagnosis  Cardiac   Palliative Care Type  New Palliative care   Reason for referral  Clarify Goals of Care   Date first seen by Palliative Care  02/27/16   Clinical Assessment    Palliative Performance Scale Score  40%   Pain Max last 24 hours  4   Pain Min Last 24 hours  3   Dyspnea Max Last 24 Hours  4   Dyspnea Min Last 24 hours  3   Nausea Max Last 24 Hours  7   Nausea Min Last 24 Hours  6   Psychosocial & Spiritual Assessment    Palliative Care Outcomes    Patient/Family meeting held?  Yes   Who was at the meeting?  Patient, wife      Time In: 1 Time Out: 12 Time Total: 60 Greater than 50%  of this time was spent counseling and coordinating care related to the above assessment and plan.  Signed by: Rosalin Hawking, MD  [phone] Please contact Palliative Medicine Team phone at [phone] for questions and concerns.  For individual provider: See Loretha Stapler

## 2016-02-27 NOTE — Progress Notes (Signed)
Palliative care consult called. Per Dr. Donnie Ahoilley, it would be helpful for family to assist in these discussions as well.  Dayna Dunn PA-C

## 2016-02-27 NOTE — Progress Notes (Addendum)
Subjective:  Continues with nausea and dry heaves.  He is very lethargic this morning.  No complaints of shortness of breath.  Will awaken for conversation.  No complaints of chest pain.  Currently on milrinone.  Objective:  Vital Signs in the last 24 hours: BP 98/54 mmHg  Pulse 97  Temp(Src) 98 F (36.7 C) (Oral)  Resp 18  Ht 5\' 7"  (1.702 m)  Wt 85.911 kg (189 lb 6.4 oz)  BMI 29.66 kg/m2  SpO2 93%  Physical Exam: Obese, lethargic appearing white male who goes to sleep easily but will awaken for exam, very hard of hearing Lungs:  Clear  Cardiac:  Regular rhythm, normal S1 and S2, no S3 Abdomen:  Soft, nontender, no masses , mildly distended Extremities:  1-2 plus edema present  Intake/Output from previous day: 06/16 0701 - 06/17 0700 In: 590 [P.O.:590] Out: 550 [Urine:550] Weight Filed Weights   02/25/16 0523 02/26/16 0500 02/27/16 0639  Weight: 85.821 kg (189 lb 3.2 oz) 85.5 kg (188 lb 7.9 oz) 85.911 kg (189 lb 6.4 oz)    Lab Results: Basic Metabolic Panel:  Recent Labs  16/06/9605/16/17 0510 02/27/16 0500  NA 135 133*  K 4.0 4.4  CL 100* 96*  CO2 27 26  GLUCOSE 117* 152*  BUN 58* 69*  CREATININE 3.72* 4.33*    CBC:  Recent Labs  02/24/16 1400  WBC 7.3  HGB 11.5*  HCT 36.3*  MCV 92.4  PLT 193    BNP    Component Value Date/Time   BNP 2128.4* 02/05/2016 1429   BNP 1319.2* 01/08/2016 1354    Cardiac Panel (last 3 results)  Recent Labs  02/24/16 1701 02/24/16 2251 02/25/16 0436  TROPONINI 0.15* 0.18* 0.18*   Telemetry: On his rhythm with PVCs and nonsustained VT  Assessment/Plan:  1.  Stage IV systolic heart failure 2.  Chronic kidney disease stage IV not candidate for dialysis and with worsening renal function since admission 3.  Nausea and vomiting 4.  Coronary artery disease with previous bypass grafting 5.  Difficult social situation as detailed in chart  Recommendations:  Extensive discussion with wife concerning prognosis and  multiple issues including worsening renal failure, poor systolic function.  I discussed palliative care and hospice with her and she is open to talking to them.  We will ask them to come by and see today.  I'm going to stop the amiodarone and lipitor as this might be contributing to the nausea.  Check abdominal x-ray to look for small bowel obstruction.  He doesn't look good and I think the long-term prognosis is very poor.  I told the wife that I think that he probably won't live more than 6 months.     Darden PalmerW. Spencer Tilley, Jr.  MD [facility] Cardiology  02/27/2016, 10:25 AM

## 2016-02-28 LAB — COMPREHENSIVE METABOLIC PANEL
ALT: 39 U/L (ref 17–63)
AST: 52 U/L — AB (ref 15–41)
Albumin: 3.2 g/dL — ABNORMAL LOW (ref 3.5–5.0)
Alkaline Phosphatase: 153 U/L — ABNORMAL HIGH (ref 38–126)
Anion gap: 11 (ref 5–15)
BILIRUBIN TOTAL: 1.2 mg/dL (ref 0.3–1.2)
BUN: 78 mg/dL — AB (ref 6–20)
CO2: 25 mmol/L (ref 22–32)
CREATININE: 4.59 mg/dL — AB (ref 0.61–1.24)
Calcium: 8.9 mg/dL (ref 8.9–10.3)
Chloride: 95 mmol/L — ABNORMAL LOW (ref 101–111)
GFR, EST AFRICAN AMERICAN: 13 mL/min — AB (ref >60)
GFR, EST NON AFRICAN AMERICAN: 11 mL/min — AB (ref >60)
Glucose, Bld: 166 mg/dL — ABNORMAL HIGH (ref 65–99)
POTASSIUM: 4.4 mmol/L (ref 3.5–5.1)
Sodium: 131 mmol/L — ABNORMAL LOW (ref 135–145)
TOTAL PROTEIN: 7 g/dL (ref 6.5–8.1)

## 2016-02-28 LAB — GLUCOSE, CAPILLARY
GLUCOSE-CAPILLARY: 150 mg/dL — AB (ref 65–99)
GLUCOSE-CAPILLARY: 254 mg/dL — AB (ref 65–99)
GLUCOSE-CAPILLARY: 171 mg/dL — AB (ref 65–99)
GLUCOSE-CAPILLARY: 282 mg/dL — AB (ref 65–99)

## 2016-02-28 LAB — CARBOXYHEMOGLOBIN
CARBOXYHEMOGLOBIN: 1.1 % (ref 0.5–1.5)
METHEMOGLOBIN: 1 % (ref 0.0–1.5)
O2 Saturation: 47.8 %
TOTAL HEMOGLOBIN: 11.1 g/dL — AB (ref 13.5–18.0)

## 2016-02-28 MED ORDER — METOLAZONE 5 MG PO TABS
5.0000 mg | ORAL_TABLET | Freq: Every day | ORAL | Status: AC
  Administered 2016-02-28 – 2016-02-29 (×2): 5 mg via ORAL
  Filled 2016-02-28 (×3): qty 1
  Filled 2016-02-28: qty 2

## 2016-02-28 NOTE — Progress Notes (Signed)
OT Cancellation Note  Patient Details Name: Tristan Baker MRN: [id number] DOB: 01/17/1941   Cancelled Treatment:    Reason Eval/Treat Not Completed: Fatigue/lethargy limiting ability to participate.  Will try back.  Pt reports he just got into bed, and CNA confirms.   Angelene GiovanniConarpe, Averie Meiner M  Essica Kiker [place], OTR/L [phone]   02/28/2016, 4:00 PM

## 2016-02-28 NOTE — Progress Notes (Addendum)
Call received and discussed with Dr Donnie Ahoilley, his note reviewed, agree with his input and recommendations.  Initial palliative consult completed on 02-27-16.  Agree with no code blue, established after Dr. York Spanielilley's discussions with the patient and his wife this am.  Patient resting comfortably in his recliner, does not wake up to gentle stimulation. Nausea resolved. Abdominal Xray with normal bowel gas pattern from 02-27-16. Wife not present at the bedside Call placed, both at wife's cell and their home number,twice, unable to reach, unable to leave a message.  Will try later today or in am.  Will need to continue discussions regarding goals of care, discuss in more detail regarding home with hospice as a discharge option.  Continue current mode of care for now.  Palliative to follow up.   Rosalin HawkingZeba Romari Gasparro MD [facility] team [phone] pager.   Addendum: Received call back from wife Diane at 1545 on 6-18: She recalls meeting with Dr. Donnie Ahoilley in the patient's room this morning. She states that the patient elects her no CODE BLUE status. She states she is simply overwhelmed without rapidly he is declining. I briefly introduced hospice care and discussed with her over the phone. She states she recognizes that the patient wants to come home. However, she states that there is a lot of reconstruction work going on around the house. She states she is faced with multiple stressors, her mother is enrolled in hospice care and lives 14 hours away. Offered active listening and supportive care. Plan: Hospice consultation on 6-19: Consider home with hospice versus residential. Discussed with wife about comfort measures, discontinuation of certain cardiac medications beyond a certain point. Discussed with her about rising serum creatinine levels, worsening kidney failure. She becomes tearful over the phone and states that she is worried that the patient may die fairly soon. We will  continue to follow along and support her.

## 2016-02-28 NOTE — Progress Notes (Addendum)
During afternoon pt felt some cramping on his leg, it happened immediately pt got wakeup for lunch, he was in deep sleep and pt got little bit mad with RN making him awake for lunch but RN also wanted to make sure that everything was ok. Ice provided for cramping legs, pt felt better, will continue to monitor patient.

## 2016-02-28 NOTE — Progress Notes (Addendum)
Subjective:  He clinically is greatly improved today.  I stopped his amiodarone and Lipitor yesterday and his nausea and vomiting have resolved.  No further diarrhea.  He is hard of hearing but had a long conversation with both he and his wife today.  He is not currently short of breath.  I discussed CODE STATUS with him and he expressed to me in the presence of his wife that he would not want resuscitation or defibrillation in the setting of a cardiac arrest.  Evidently they are considering palliative care and he has tried to get used to the idea but is willing to talk to them again about it.  He very much wants to go back home.  Objective:  Vital Signs in the last 24 hours: BP 101/74 mmHg  Pulse 78  Temp(Src) 98.6 F (37 C) (Oral)  Resp 21  Ht 5\' 7"  (1.702 m)  Wt 86.682 kg (191 lb 1.6 oz)  BMI 29.92 kg/m2  SpO2 95%  Physical Exam: He is more alert today sitting up in the chair at bedside in no acute distress, very hard of hearing Lungs:  Clear  Cardiac:  Regular rhythm, normal S1 and S2, no S3 Abdomen:  Soft, nontender, no masses , mildly distended Extremities:  1-2 plus edema present  Intake/Output from previous day: 06/17 0701 - 06/18 0700 In: 956.1 [P.O.:780; I.V.:176.1] Out: 701 [Urine:600; Stool:101] Weight Filed Weights   02/26/16 0500 02/27/16 0639 02/28/16 0500  Weight: 85.5 kg (188 lb 7.9 oz) 85.911 kg (189 lb 6.4 oz) 86.682 kg (191 lb 1.6 oz)    Lab Results: Basic Metabolic Panel:  Recent Labs  16/06/9605/17/17 0500 02/28/16 0121  NA 133* 131*  K 4.4 4.4  CL 96* 95*  CO2 26 25  GLUCOSE 152* 166*  BUN 69* 78*  CREATININE 4.33* 4.59*   BNP    Component Value Date/Time   BNP 2128.4* 02/05/2016 1429   BNP 1319.2* 01/08/2016 1354  Telemetry: On his rhythm with PVCs and nonsustained VT  Assessment/Plan:  1.  Stage IV systolic heart failure- Co ox is only 47 today 2.  Chronic kidney disease stage IV not candidate for dialysis and with worsening renal function  since admission 3.  Nausea and vomiting-Resolved currently with stopping amiodarone and Lipitor-his x-ray yesterday was unremarkable. 4.  Coronary artery disease with previous bypass grafting  Recommendations:  His renal function continues to worsen.  He is not really a candidate for dialysis with his cardiac status.  Per discussions today he will be made a no CODE BLUE.  The nausea has resolved and although he has ventricular tachycardia not to restart amiodarone at this time since the nausea has improved.  Talks palliative care and they will come back and discuss with him further today.  Continue milrinone.  Weight is up somewhat today.  Add metolazone today.      Darden PalmerW. Spencer Kyndall Chaplin, Jr.  MD [facility] Cardiology  02/28/2016, 10:45 AM

## 2016-02-29 ENCOUNTER — Encounter (HOSPITAL_COMMUNITY): Payer: Medicare Other

## 2016-02-29 DIAGNOSIS — N179 Acute kidney failure, unspecified: Secondary | ICD-10-CM

## 2016-02-29 LAB — BASIC METABOLIC PANEL
ANION GAP: 12 (ref 5–15)
BUN: 86 mg/dL — ABNORMAL HIGH (ref 6–20)
CALCIUM: 8.8 mg/dL — AB (ref 8.9–10.3)
CO2: 25 mmol/L (ref 22–32)
Chloride: 94 mmol/L — ABNORMAL LOW (ref 101–111)
Creatinine, Ser: 5.07 mg/dL — ABNORMAL HIGH (ref 0.61–1.24)
GFR, EST AFRICAN AMERICAN: 12 mL/min — AB (ref >60)
GFR, EST NON AFRICAN AMERICAN: 10 mL/min — AB (ref >60)
Glucose, Bld: 136 mg/dL — ABNORMAL HIGH (ref 65–99)
POTASSIUM: 4.2 mmol/L (ref 3.5–5.1)
Sodium: 131 mmol/L — ABNORMAL LOW (ref 135–145)

## 2016-02-29 LAB — CARBOXYHEMOGLOBIN
CARBOXYHEMOGLOBIN: 1.5 % (ref 0.5–1.5)
METHEMOGLOBIN: 0.9 % (ref 0.0–1.5)
O2 SAT: 78.8 %
TOTAL HEMOGLOBIN: 9.2 g/dL — AB (ref 13.5–18.0)

## 2016-02-29 LAB — GLUCOSE, CAPILLARY
Glucose-Capillary: 130 mg/dL — ABNORMAL HIGH (ref 65–99)
Glucose-Capillary: 204 mg/dL — ABNORMAL HIGH (ref 65–99)
Glucose-Capillary: 278 mg/dL — ABNORMAL HIGH (ref 65–99)
Glucose-Capillary: 227 mg/dL — ABNORMAL HIGH (ref 65–99)

## 2016-02-29 MED ORDER — FUROSEMIDE 10 MG/ML IJ SOLN
160.0000 mg | Freq: Three times a day (TID) | INTRAVENOUS | Status: DC
  Filled 2016-02-29 (×3): qty 16

## 2016-02-29 MED ORDER — DEXTROSE 5 % IV SOLN
160.0000 mg | Freq: Three times a day (TID) | INTRAVENOUS | Status: DC
  Administered 2016-02-29 – 2016-03-01 (×4): 160 mg via INTRAVENOUS
  Filled 2016-02-29 (×8): qty 16

## 2016-02-29 NOTE — Evaluation (Addendum)
Occupational Therapy Evaluation Patient Details Name: Tristan SkainsFrank Saab MRN: [id number] DOB: 12/10/1940 Today's Date: 02/29/2016    History of Present Illness Pt is a 75 y.o. male admitted from SNF with c/o mailaise. Pt's PMH includes DVT, CAD s/p CABG, HTN, HLD, DM, MI, HOH, CHF, CKD stage IV, anxiety, depression, RBBB.   Clinical Impression   Pt admitted with above. Pt getting assist with bathing, PTA. Feel pt will benefit from acute OT to increase independence prior to d/c. Recommending HHOT upon d/c.     Follow Up Recommendations  Home health OT;Supervision/Assistance - 24 hour    Equipment Recommendations  Other (comment) (shower chair TBD)    Recommendations for Other Services       Precautions / Restrictions Precautions Precautions: Other (comment);Fall Precaution Comments: monitor HR and O2 Restrictions Weight Bearing Restrictions: No      Mobility Bed Mobility               General bed mobility comments: pt sitting in chair upon arrival.  Transfers Overall transfer level: Needs assistance Equipment used: None Transfers: Sit to/from Stand Sit to Stand: Min guard            Balance Loss of balance when simulating functional task while standing with single leg stance-Assist given. Walked short distance to and from sink with Min guard assist.                            ADL Overall ADL's : Needs assistance/impaired     Grooming: Min guard;Standing;Wash/dry face;Brushing hair         Lower Body Bathing Details (indicate cue type and reason): Mod A for balance when simulating functional task standing-single leg stance         Toilet Transfer: Min guard;Ambulation (went short distance; sit to stand from chair)           Functional mobility during ADLs: Min guard General ADL Comments: Educated on UB dressing technique. Mentioned use of long handled sponge for LB bathing. Educated on options for shower chair.      Vision      Perception     Praxis      Pertinent Vitals/Pain Pain Assessment: Faces Faces Pain Scale: Hurts little more Pain Location: Rt shoulder with increased ROM Pain Descriptors / Indicators:  (notified OT it hurt) Pain Intervention(s): Monitored during session   HR up to around 110, but went down.      Hand Dominance Right   Extremity/Trunk Assessment Upper Extremity Assessment Upper Extremity Assessment: RUE deficits/detail;LUE deficits/detail RUE Deficits / Details: history of Rt shoulder surgery; pain with increased shoulder flexion; weakness in shoulder flexors; difficulty with bringing hand to face to simulate functional task LUE Deficits / Details: weakness in shoulder flexors   Lower Extremity Assessment Lower Extremity Assessment: Defer to PT evaluation       Communication Communication Communication: HOH   Cognition Arousal/Alertness: Awake/alert Behavior During Therapy: WFL for tasks assessed/performed Overall Cognitive Status: Within Functional Limits for tasks assessed                     General Comments       Exercises Exercises: Other exercises Other Exercises Other Exercises: educated on ankle pumps   Shoulder Instructions      Home Living Family/patient expects to be discharged to:: Private residence Living Arrangements: Spouse/significant other Available Help at Discharge: Family;Available 24 hours/day Type of Home: House Home Access: Stairs  to enter Entrance Stairs-Number of Steps: 5 Entrance Stairs-Rails: Can reach both;Right;Left Home Layout: One level     Bathroom Shower/Tub: Tub/shower unit;Door Shower/tub characteristics: Door Bathroom Toilet: Handicapped height; sink close     Home Equipment: Cane - single point          Prior Functioning/Environment Level of Independence: Needs assistance  Gait / Transfers Assistance Needed: Not using AD at SNF.  Has been using weight machines there. ADL's / Homemaking Assistance  Needed: Needs assist from wife for lower body and back bathing via sponge bath standing at sink.          OT Diagnosis: Generalized weakness   OT Problem List: Increased edema;Decreased range of motion;Decreased activity tolerance;Decreased strength;Impaired balance (sitting and/or standing);Decreased knowledge of use of DME or AE;Pain   OT Treatment/Interventions: Self-care/ADL training;DME and/or AE instruction;Therapeutic activities;Patient/family education;Balance training;Therapeutic exercise;Energy conservation    OT Goals(Current goals can be found in the care plan section) Acute Rehab OT Goals Patient Stated Goal: go home OT Goal Formulation: With patient Time For Goal Achievement: 03/07/16 Potential to Achieve Goals: Good ADL Goals Pt Will Perform Lower Body Dressing: with set-up;with supervision;sit to/from stand (with or without AE) Pt Will Transfer to Toilet: with supervision;ambulating (elevated toilet; using RW) Pt Will Perform Toileting - Clothing Manipulation and hygiene: with modified independence;sit to/from stand  OT Frequency: Min 2X/week   Barriers to D/C:            Co-evaluation              End of Session Equipment Utilized During Treatment: Oxygen;Gait belt  Activity Tolerance: Patient tolerated treatment well Patient left: in chair;with family/visitor present   Time: 1610-9604 OT Time Calculation (min): 23 min Charges:  OT General Charges $OT Visit: 1 Procedure OT Evaluation $OT Eval Moderate Complexity: 1 Procedure G-CodesEarlie Raveling OTR/L [phone] 02/29/2016, 12:35 PM

## 2016-02-29 NOTE — Progress Notes (Signed)
Inpatient Diabetes Program Recommendations  AACE/ADA: New Consensus Statement on Inpatient Glycemic Control (2015)  Target Ranges:  Prepandial:   less than 140 mg/dL      Peak postprandial:   less than 180 mg/dL (1-2 hours)      Critically ill patients:  140 - 180 mg/dL   Lab Results  Component Value Date   GLUCAP 204* 02/29/2016   HGBA1C 9.4* 02/05/2016    Review of Glycemic Control  Inpatient Diabetes Program Recommendations:  Insulin - Basal: . Correction (SSI): add Novolog sensitive scale TID Thank you  Piedad ClimesGina Jezreel Sisk MSN, RN,CDE Inpatient Diabetes Coordinator [phone] (team pager)

## 2016-02-29 NOTE — Progress Notes (Signed)
Daily Progress Note   Patient Name: Tristan SkainsFrank Baker       Date: 02/29/2016 DOB: 06/18/1941  Age: 75 y.o. MRN#: [id number] Attending Physician: Dolores Pattyaniel R Bensimhon, MD Primary Care Physician: Martha ClanShaw, William, MD Admit Date: 02/24/2016  Reason for Consultation/Follow-up: Establishing goals of care  Subjective:  participated some with PT today Hard of hearing, resting in bed Wife at bedside See discussions below:  Length of Stay: 5  Current Medications: Scheduled Meds:  . aspirin EC  81 mg Oral Daily  . enoxaparin (LOVENOX) injection  30 mg Subcutaneous Q24H  . feeding supplement (GLUCERNA SHAKE)  237 mL Oral BID BM  . furosemide  160 mg Intravenous Q8H  . gabapentin  300 mg Oral QHS  . hydrALAZINE  50 mg Oral Q8H  . insulin aspart  3 Units Subcutaneous TID WC  . insulin detemir  30 Units Subcutaneous QHS  . isosorbide mononitrate  60 mg Oral Daily  . magnesium oxide  400 mg Oral Daily  . multivitamin with minerals  1 tablet Oral QHS  . polyethylene glycol  17 g Oral Daily  . potassium chloride  20 mEq Oral Daily  . sertraline  50 mg Oral QHS  . sodium chloride flush  3 mL Intravenous Q12H    Continuous Infusions: . milrinone 0.25 mcg/kg/min (02/29/16 0155)    PRN Meds: sodium chloride, acetaminophen, nitroGLYCERIN, ondansetron (ZOFRAN) IV, sodium chloride flush, sodium chloride flush, traMADol  Physical Exam         Awake alert NAD Diminished S1 S2 Trace edema Non focal  Vital Signs: BP 101/63 mmHg  Pulse 85  Temp(Src) 98.9 F (37.2 C) (Oral)  Resp 20  Ht 5\' 7"  (1.702 m)  Wt 87.136 kg (192 lb 1.6 oz)  BMI 30.08 kg/m2  SpO2 94% SpO2: SpO2: 94 % O2 Device: O2 Device: Not Delivered O2 Flow Rate: O2 Flow Rate (L/min): 2 L/min  Intake/output summary:  Intake/Output  Summary (Last 24 hours) at 02/29/16 1228 Last data filed at 02/29/16 1213  Gross per 24 hour  Intake 969.59 ml  Output   1375 ml  Net -405.41 ml   LBM: Last BM Date: 02/28/16 Baseline Weight: Weight: 86.546 kg (190 lb 12.8 oz) Most recent weight: Weight: 87.136 kg (192 lb 1.6 oz)       Palliative Assessment/Data:  Flowsheet Rows        Most Recent Value   Intake Tab    Referral Department  Cardiology   Unit at Time of Referral  Cardiac/Telemetry Unit   Palliative Care Primary Diagnosis  Cardiac   Palliative Care Type  New Palliative care   Reason for referral  Clarify Goals of Care   Date first seen by Palliative Care  02/27/16   Clinical Assessment    Palliative Performance Scale Score  40%   Pain Max last 24 hours  4   Pain Min Last 24 hours  3   Dyspnea Max Last 24 Hours  4   Dyspnea Min Last 24 hours  3   Nausea Max Last 24 Hours  7   Nausea Min Last 24 Hours  6   Psychosocial & Spiritual Assessment    Palliative Care Outcomes    Patient/Family meeting held?  Yes   Who was at the meeting?  Patient, wife      Patient Active Problem List   Diagnosis Date Noted  . Nausea & vomiting   . Encounter for palliative care   . Goals of care, counseling/discussion   . CKD (chronic kidney disease) stage 4, GFR 15-29 ml/min (HCC) 02/18/2016  . Chronic combined systolic (congestive) and diastolic (congestive) heart failure (HCC) 02/18/2016  . Coronary artery disease involving native coronary artery of native heart without angina pectoris   . Acute on chronic renal failure (HCC)   . Acute on chronic systolic CHF (congestive heart failure) (HCC) 02/04/2016  . Hyperlipidemia 06/18/2013  . H/O class III angina pectoris 12/12/2012  . CAD (coronary artery disease) 12/12/2012  . CKD (chronic kidney disease) stage 3, GFR 30-59 ml/min 12/12/2012  . Ischemic heart disease 08/22/2011  . Diabetes mellitus 08/22/2011  . HTN (hypertension) 08/22/2011  . Obesity 08/22/2011     Palliative Care Assessment & Plan   Patient Profile:    Assessment:  end stage CHF Worsening renal function Declining functional status Underlying htn dm IHD  Recommendations/Plan:   family meeting with patient and wife, and then phone call with daughter Jennette Kettle this am:  Patient wishes to go home  He is accepting of hospice services at home  He will consider going to residential hospice if he is not able to manage with home with hospice support after discharge  If milrinone is not able to be covered by hospice, patient elects for it to be D/C  Prognosis appears to be few weeks at this point, given rising serum creatinine, volume overload, possibility of d/c milrinone on d/c.      Code Status:    Code Status Orders        Start     Ordered   02/28/16 1051  Limited resuscitation (code)   Continuous    Question Answer Comment  In the event of cardiac or respiratory ARREST: Initiate Code Blue, Call Rapid Response No   In the event of cardiac or respiratory ARREST: Perform CPR No   In the event of cardiac or respiratory ARREST: Perform Intubation/Mechanical Ventilation No   In the event of cardiac or respiratory ARREST: Use NIPPV/BiPAp only if indicated Yes   In the event of cardiac or respiratory ARREST: Administer ACLS medications if indicated No   In the event of cardiac or respiratory ARREST: Perform Defibrillation or Cardioversion if indicated No      02/28/16 1050    Code Status History    Date Active Date Inactive Code Status Order  ID Comments User Context   02/24/2016  4:25 PM 02/28/2016 10:51 AM Full Code [id number]  Luane School Inpatient   02/05/2016 11:21 AM 02/11/2016  9:15 PM Full Code [id number]  Graciella Freer, PA-C Inpatient       Prognosis:   < 4 weeks  Discharge Planning:  Home with Hospice  Care plan was discussed with patient wife in family meeting for 30 minutes. Additional 20 minute phone call with daughter Westly Pam who is a P.A at [phone]  Thank you for allowing the Palliative Medicine Team to assist in the care of this patient.   Time In: 11 Time Out: 1135 Total Time 35 Prolonged Time Billed  no       Greater than 50%  of this time was spent counseling and coordinating care related to the above assessment and plan.  Rosalin Hawking, MD [phone]  Please contact Palliative Medicine Team phone at [phone] for questions and concerns.

## 2016-02-29 NOTE — Progress Notes (Signed)
Patient ID: Tristan SkainsFrank Talavera, male   DOB: 12/28/1940, 75 y.o.   MRN: [id number]     SUBJECTIVE: Patient is comfortable today but diuresed poorly.  Creatinine up to 5.07.  Co-ox good at 79%.  He continues to have NSVT runs on telemetry.  Amiodarone stopped due to nausea, which seems to have resolved.   Scheduled Meds: . aspirin EC  81 mg Oral Daily  . enoxaparin (LOVENOX) injection  30 mg Subcutaneous Q24H  . feeding supplement (GLUCERNA SHAKE)  237 mL Oral BID BM  . furosemide  160 mg Intravenous Q8H  . gabapentin  300 mg Oral QHS  . hydrALAZINE  50 mg Oral Q8H  . insulin aspart  3 Units Subcutaneous TID WC  . insulin detemir  30 Units Subcutaneous QHS  . isosorbide mononitrate  60 mg Oral Daily  . magnesium oxide  400 mg Oral Daily  . metolazone  5 mg Oral Daily  . multivitamin with minerals  1 tablet Oral QHS  . polyethylene glycol  17 g Oral Daily  . potassium chloride  20 mEq Oral Daily  . sertraline  50 mg Oral QHS  . sodium chloride flush  3 mL Intravenous Q12H   Continuous Infusions: . milrinone 0.25 mcg/kg/min (02/29/16 0155)   PRN Meds:.sodium chloride, acetaminophen, nitroGLYCERIN, ondansetron (ZOFRAN) IV, sodium chloride flush, sodium chloride flush, traMADol    Filed Vitals:   02/28/16 1500 02/28/16 2130 02/29/16 0023 02/29/16 0500  BP: 90/50 125/59 109/59 110/58  Pulse: 60 63 91 85  Temp:  99.5 F (37.5 C)  98.9 F (37.2 C)  TempSrc:  Oral  Oral  Resp:  18  20  Height:      Weight:    192 lb 1.6 oz (87.136 kg)  SpO2:  93%  97%    Intake/Output Summary (Last 24 hours) at 02/29/16 0833 Last data filed at 02/29/16 0700  Gross per 24 hour  Intake 868.95 ml  Output    975 ml  Net -106.05 ml    LABS: Basic Metabolic Panel:  Recent Labs  40/98/1106/18/17 0121 02/29/16 0620  NA 131* 131*  K 4.4 4.2  CL 95* 94*  CO2 25 25  GLUCOSE 166* 136*  BUN 78* 86*  CREATININE 4.59* 5.07*  CALCIUM 8.9 8.8*   Liver Function Tests:  Recent Labs  02/28/16 0121  AST  52*  ALT 39  ALKPHOS 153*  BILITOT 1.2  PROT 7.0  ALBUMIN 3.2*   No results for input(s): LIPASE, AMYLASE in the last 72 hours. CBC: No results for input(s): WBC, NEUTROABS, HGB, HCT, MCV, PLT in the last 72 hours. Cardiac Enzymes: No results for input(s): CKTOTAL, CKMB, CKMBINDEX, TROPONINI in the last 72 hours. BNP: Invalid input(s): POCBNP D-Dimer: No results for input(s): DDIMER in the last 72 hours. Hemoglobin A1C: No results for input(s): HGBA1C in the last 72 hours. Fasting Lipid Panel: No results for input(s): CHOL, HDL, LDLCALC, TRIG, CHOLHDL, LDLDIRECT in the last 72 hours. Thyroid Function Tests: No results for input(s): TSH, T4TOTAL, T3FREE, THYROIDAB in the last 72 hours.  Invalid input(s): FREET3 Anemia Panel: No results for input(s): VITAMINB12, FOLATE, FERRITIN, TIBC, IRON, RETICCTPCT in the last 72 hours.  RADIOLOGY: Dg Chest Port 1 View  02/26/2016  CLINICAL DATA:  PICC line placement. EXAM: PORTABLE CHEST 1 VIEW COMPARISON:  02/24/2016 FINDINGS: Interval replacement or advancement of right PICC line. Tip now localizes to the lower esophagus. No visible pneumothorax. Postoperative changes in the mediastinum. Cardiac enlargement. Increasing opacity in  the right mid and lower lung probably representing pneumonia or atelectasis. Probable small right pleural effusion. Left lung is grossly clear. IMPRESSION: Right PICC line tip localizes over the distal SVC. No pneumothorax. Increasing infiltration or atelectasis in the right lung with small right pleural effusion. Cardiac enlargement. Electronically Signed   By: Burman Nieves M.D.   On: 02/26/2016 02:31   Dg Chest Port 1 View  02/24/2016  CLINICAL DATA:  Evaluate PICC line position. Congestive heart failure. EXAM: PORTABLE CHEST 1 VIEW COMPARISON:  02/07/2016 and 01/05/2016. FINDINGS: 1859 hours. The previously demonstrated right arm PICC has significantly retracted to the level of the mid right clavicle, probably in  the right subclavian vein. There is stable cardiomegaly status post CABG. There is improved aeration of the lung bases with mild residual right basilar pulmonary opacity. No significant pleural effusion or edema seen. There is a subtle lucency projecting lateral to the right upper lobe which could reflect a small pneumothorax or overlying skin fold. IMPRESSION: 1. Interval significant retraction of the right arm PICC into the right subclavian vein. PICC line repositioning recommended. 2. Interval improvement in the aeration of the lung bases. Cannot exclude a small right-sided pneumothorax. 3. These results were called by telephone at the time of interpretation on 02/24/2016 at 6:33 pm to patient's nurse, Martie Lee, who verbally acknowledged these results. Electronically Signed   By: Carey Bullocks M.D.   On: 02/24/2016 18:35   Dg Chest Port 1 View  02/07/2016  CLINICAL DATA:  Distant heart failure. Bilateral lower extremity edema. EXAM: PORTABLE CHEST 1 VIEW COMPARISON:  01/05/2016 FINDINGS: Right PICC line tip:  SVC. Mild enlargement of the cardiopericardial silhouette and prior CABG. Bibasilar airspace opacities with poor definition of the hemidiaphragms, at least partially due to small bilateral pleural effusions. Mild airway thickening. Aortic arch atherosclerosis.  Thoracic spondylosis. IMPRESSION: 1. Mild enlargement of the cardiopericardial silhouette. 2. Bilateral pleural effusions. Right greater than left basilar airspace opacity potentially from atelectasis or pneumonia. 3. New right PICC line tip: SVC. 4. Atherosclerotic aortic arch. 5. Airway thickening is present, suggesting bronchitis or reactive airways disease. Electronically Signed   By: Gaylyn Rong M.D.   On: 02/07/2016 14:02   Dg Abd Portable 1v  02/27/2016  CLINICAL DATA:  Nausea vomiting and diarrhea for 2 days. EXAM: PORTABLE ABDOMEN - 1 VIEW COMPARISON:  None. FINDINGS: The bowel gas pattern is normal. No radio-opaque calculi or  other significant radiographic abnormality are seen. Postsurgical changes are seen in the lower thorax. IMPRESSION: Negative. Electronically Signed   By: Ted Mcalpine M.D.   On: 02/27/2016 12:16    PHYSICAL EXAM General: NAD Neck: JVP 14-16 cm, no thyromegaly or thyroid nodule.  Lungs: Clear to auscultation bilaterally with normal respiratory effort. CV: Lateral PMI.  Heart regular S1/S2, no S3/S4, 2/6 SEM RUSB.  1+ edema to knees bilaterally.   Abdomen: Soft, nontender, no hepatosplenomegaly, no distention.  Neurologic: Alert and oriented x 3.  Psych: Normal affect. Extremities: No clubbing or cyanosis.   TELEMETRY: Reviewed telemetry pt in NSR with frequent PVCs and NSVT runs  ASSESSMENT AND PLAN: 75 yo with history of chronic systolic CHF/ischemic CMP, CAD, and CKD who had been on home milrinone was admitted with acute/chronic systolic CHF and AKI on CKD.  1. Acute on chronic systolic CHF: Ischemic cardiomyopathy. LVEF 25-30% Recent admission with identification of low output heart failure. Unfortunately, it appears at this point that he is end-stage. He advanced renal disease precludes LVAD placement and he  is of too advanced age for transplant. We were unable to titrate him off milrinone during recent admission, he remains on milrinone at 0.25. Now remains on milrinone with ongoing marked volume overload and poor diuresis, creatinine continues to rise.  - Continue milrinone 0.25.  - Continue hydralazine/Imdur with stable BP.  - No ACEI/ARB/spironolactone with AKI and no beta blocker with low output.  - Increase Lasix to 160 mg IV every 8 hrs with metolazone again today.   2. CAD: S/p CABG.  No chest pain.  He remains on ASA 81, atorvastatin was stopped over weekend in setting of nausea.  3. PVCs/NSVT: Worse on milrinone but has been inotrope-dependent.  Amiodarone was stopped over weekend due to nausea with improvement . Ongoing short NSVT runs.  If moves towards hospice, as it  looks like he will, would not restart amiodarone.  4. AKI on CKD stage IV: Creatinine continues to worsen.  He is not an HD candidate with end-stage CHF on home inotropes.  This was discussed with patient and wife today.  5. Aortic stenosis: Mild to moderate on last echo.  6. Disposition: He is currently DNR/DNI.  He has end-stage CHF with worsening cardiorenal syndrome.  I discussed the full situation with patient and wife today.  I think they are inclined to opt for hospice.  He wants to go home.  Palliative care to discuss hospice options with him today.   Marca Ancona 02/29/2016 8:41 AM

## 2016-02-29 NOTE — Care Management Note (Addendum)
Case Management Note  Patient Details  Name: Tristan Baker MRN: [id number] Date of Birth: 09/04/1941  Subjective/Objective:   Pt admitted with acute on chronic HF                 Action/Plan:  [facility]  Pt unsure if he wants to return however wife has concerns with pt returning home in current condtion - PT consulted for evaluation.  [facility] aware that pt may discharge home and will continue to follow .  Palliative Care consult being recommended by cardiology. CM will continue to monitor for discharge needs   Expected Discharge Date:                  Expected Discharge Plan:  Skilled Nursing Facility  In-House Referral:  Clinical Social Work  Discharge planning Services  CM Consult  Post Acute Care Choice:    Choice offered to:     DME Arranged:    DME Agency:     HH Arranged:    HH Agency:     Status of Service:  In process, will continue to follow  Medicare Important Message Given:  Yes Date Medicare IM Given:    Medicare IM give by:    Date Additional Medicare IM Given:    Additional Medicare Important Message give by:     If discussed at Long Length of Stay Meetings, dates discussed:    Additional Comments: 02/29/2016  Home with Hospice referral for [facility] has been accepted with milrinone.  Pt to discharge in the am to daughters home.  Palliative NP followed back up with CM; pt refusing residential hospice - wants to go home.  Wife is at bedside and is requesting nurse aid and equipment.  CM offered choice to wife and pt - choice deferred to daughter Wilder GladeDeahna.  Family has decided to continue milrinone at home - cm contacted agency of choice that would accept Milrinone ([facility])  Referral accepted - staff verified that pt will be accepted with current needs/plan/milrinone/plan to initially discharge to daughter's home and then later have services switched to pts home.  Liaison to  meet with pt and wife/daughter via phone at bedside and follow up with CM.  Due to current home conditions (excess furniture) pt will discharge to daughters home for the interim - family already making plans that will allow them to transition back to home.    CM spoke with palliative NP and HF NP in depth; pt may not be appropriate for home with hospice due to creatinine level/multiple organ failure and current Milrinone requirement.  Palliative to reassess pt and follow up with CM regarding best discharge plan. Cherylann ParrClaxton, Hanni Milford S, RN 02/29/2016, 9:55 AM

## 2016-02-29 NOTE — Clinical Social Work Note (Addendum)
CSW continues to follow for discharge needs. PT recommending HHPT. There is a palliative consult in. Will follow for possible residential hospice referral.  Charlynn CourtSarah Craven Crean, CSW [phone]  11:55 am [facility], patient going home with hospice. CSW signing off.  Charlynn CourtSarah Lancelot Alyea, CSW [phone]

## 2016-02-29 NOTE — Consult Note (Addendum)
[facility]: Met with pt and his wife. Discussed hospice philosophy and the criteria for our services. Discussed how we would be able to help him in the home. He will be going to his daughters home in [place]. He will need equipment ordered and delivered today for the pt to go home tomorrow. [facility] will deliver hospital bed, oxygen, wheelchair, BSC, walker, shower chair and OBT to the home tonight. Spoke to NP on floor and she is aware of the plan. I spoke to the daughter and she will be available for equipment to be delivered today. Webb Silversmith RN   Left a voice mail message for Carolynn Sayers to notify her of pt and him going home tomorrow with hospice care and need of pump and milirone gtt. Awaiting TC back. Webb Silversmith RN

## 2016-02-29 NOTE — Care Management Important Message (Signed)
Important Message  Patient Details  Name: Tristan Baker MRN: [id number] Date of Birth: 05/28/1941   Medicare Important Message Given:  Yes    Bernadette HoitShoffner, Evette Diclemente Coleman 02/29/2016, 10:12 AM

## 2016-02-29 NOTE — Progress Notes (Signed)
Physical Therapy Treatment Patient Details Name: Tristan SkainsFrank Baker MRN: [id number] DOB: 08/26/1941 Today's Date: 02/29/2016    History of Present Illness Pt is a 75 y/o M admitted from SNF w/ c/o mailaise.  Pt in stage IV systolic heart failure.  Pt's PMH includes DVT, MI, HOH, CHF, CKD stage IV, anxiety, depression, RBBB.    PT Comments    Mr. Tristan Baker was agreeable to therapy and made good progress today.  He ambulated 200 ft w/ min guard assist for safety.  HR up to 120 and SpO2 remained at or above 93% on 2L O2.  Pt will benefit from continued skilled PT services to improve quality of life and functional independence.   Follow Up Recommendations  Home health PT;Supervision for mobility/OOB     Equipment Recommendations  None recommended by PT    Recommendations for Other Services OT consult     Precautions / Restrictions Precautions Precautions: Other (comment) Precaution Comments: monitor HR and O2 Restrictions Weight Bearing Restrictions: No    Mobility  Bed Mobility               General bed mobility comments: Pt sitting in recliner chair upon PT arrival  Transfers Overall transfer level: Needs assistance Equipment used: None Transfers: Sit to/from Stand Sit to Stand: Min guard         General transfer comment: Min guard for safety as pt is slightly unsteady.  Performed sit<>stand x5 from recliner chair.  Ambulation/Gait Ambulation/Gait assistance: Min guard Ambulation Distance (Feet): 200 Feet Assistive device: None Gait Pattern/deviations: Step-through pattern;Decreased stride length   Gait velocity interpretation: Below normal speed for age/gender General Gait Details: Min guard for safety as pt slightly unsteady w/ decreased gait speed.  SpO2 remained at or above 93% on 2L O2.  HR up as high as 120.   Stairs            Wheelchair Mobility    Modified Rankin (Stroke Patients Only)       Balance Overall balance assessment: Needs  assistance Sitting-balance support: No upper extremity supported;Feet supported Sitting balance-Leahy Scale: Good     Standing balance support: No upper extremity supported;During functional activity Standing balance-Leahy Scale: Fair                      Cognition Arousal/Alertness: Awake/alert Behavior During Therapy: Flat affect Overall Cognitive Status: Within Functional Limits for tasks assessed                      Exercises General Exercises - Upper Extremity Shoulder Flexion: AROM;Left;10 reps;Seated;Limitations (unable Rt UE due to pain ) General Exercises - Lower Extremity Long Arc Quad: AROM;Both;10 reps;Seated Hip Flexion/Marching: Both;10 reps;Seated Other Exercises Other Exercises: Sit>stand x5 from recliner chair w/ min guard for safety    General Comments General comments (skin integrity, edema, etc.): Tristan Baker off upon PT arrival and SpO2 reading at 86%.  Donned 2L O2 w/ SpO2 recovering into the high 90s.  At end of session pt's wife inquires if pt has received his medicine for his depression, this PT notified the RN.Marland Kitchen.      Pertinent Vitals/Pain Pain Assessment: No/denies pain    Home Living                      Prior Function            PT Goals (current goals can now be found in the care plan section) Acute Rehab  PT Goals Patient Stated Goal: to get stronger PT Goal Formulation: With patient Time For Goal Achievement: 03/12/16 Potential to Achieve Goals: Good Progress towards PT goals: Progressing toward goals    Frequency  Min 3X/week    PT Plan Current plan remains appropriate    Co-evaluation             End of Session Equipment Utilized During Treatment: Oxygen;Gait belt Activity Tolerance: Patient tolerated treatment well;Patient limited by fatigue Patient left: in chair;with call bell/phone within reach;with chair alarm set;with family/visitor present     Time: 1610-9604 PT Time Calculation (min) (ACUTE  ONLY): 20 min  Charges:  $Gait Training: 8-22 mins                    G Codes:       Encarnacion Chu PT, DPT  Pager: [phone] Phone: [phone] 02/29/2016, 10:28 AM

## 2016-02-29 NOTE — Progress Notes (Signed)
   02/29/16 1400  Clinical Encounter Type  Visited With Patient;Family;Patient and family together  Visit Type Initial;Spiritual support;Social support;Other (Comment) (HCPOA Literature)  Referral From Patient;Nurse;Palliative care team  Spiritual Encounters  Spiritual Needs Literature;Emotional  CH referred to pt to aid in Advanced Directive.  CH reviewed paperwork and gave to pt to complete.  Please call CH when completed. 2:19 PM Erline LevineMichael I Ermal Haberer

## 2016-03-01 DIAGNOSIS — Z515 Encounter for palliative care: Secondary | ICD-10-CM

## 2016-03-01 DIAGNOSIS — R1110 Vomiting, unspecified: Secondary | ICD-10-CM

## 2016-03-01 LAB — GLUCOSE, CAPILLARY
GLUCOSE-CAPILLARY: 199 mg/dL — AB (ref 65–99)
Glucose-Capillary: 196 mg/dL — ABNORMAL HIGH (ref 65–99)

## 2016-03-01 LAB — BASIC METABOLIC PANEL
Anion gap: 14 (ref 5–15)
BUN: 97 mg/dL — AB (ref 6–20)
CO2: 24 mmol/L (ref 22–32)
CREATININE: 5.76 mg/dL — AB (ref 0.61–1.24)
Calcium: 9 mg/dL (ref 8.9–10.3)
Chloride: 93 mmol/L — ABNORMAL LOW (ref 101–111)
GFR calc Af Amer: 10 mL/min — ABNORMAL LOW (ref >60)
GFR, EST NON AFRICAN AMERICAN: 9 mL/min — AB (ref >60)
Glucose, Bld: 190 mg/dL — ABNORMAL HIGH (ref 65–99)
Potassium: 4.5 mmol/L (ref 3.5–5.1)
SODIUM: 131 mmol/L — AB (ref 135–145)

## 2016-03-01 LAB — CARBOXYHEMOGLOBIN
Carboxyhemoglobin: 1.8 % — ABNORMAL HIGH (ref 0.5–1.5)
Methemoglobin: 0.6 % (ref 0.0–1.5)
O2 SAT: 87.2 %
Total hemoglobin: 11.3 g/dL — ABNORMAL LOW (ref 13.5–18.0)

## 2016-03-01 LAB — CBC
HCT: 33.3 % — ABNORMAL LOW (ref 39.0–52.0)
Hemoglobin: 10.6 g/dL — ABNORMAL LOW (ref 13.0–17.0)
MCH: 29 pg (ref 26.0–34.0)
MCHC: 31.8 g/dL (ref 30.0–36.0)
MCV: 91.2 fL (ref 78.0–100.0)
PLATELETS: 208 10*3/uL (ref 150–400)
RBC: 3.65 MIL/uL — ABNORMAL LOW (ref 4.22–5.81)
RDW: 15.6 % — AB (ref 11.5–15.5)
WBC: 9.5 10*3/uL (ref 4.0–10.5)

## 2016-03-01 MED ORDER — POTASSIUM CHLORIDE CRYS ER 20 MEQ PO TBCR: 20.0000 meq | EXTENDED_RELEASE_TABLET | Freq: Every day | ORAL | Status: AC

## 2016-03-01 MED ORDER — ISOSORBIDE MONONITRATE ER 60 MG PO TB24: 60.0000 mg | ORAL_TABLET | Freq: Every day | ORAL | Status: AC

## 2016-03-01 MED ORDER — SPIRONOLACTONE 25 MG PO TABS: 12.5000 mg | ORAL_TABLET | Freq: Every day | ORAL | Status: AC

## 2016-03-01 MED ORDER — ONDANSETRON HCL 4 MG PO TABS: 4.0000 mg | ORAL_TABLET | Freq: Four times a day (QID) | ORAL | Status: AC | PRN

## 2016-03-01 MED ORDER — METOLAZONE 5 MG PO TABS
5.0000 mg | ORAL_TABLET | Freq: Once | ORAL | Status: AC
  Administered 2016-03-01: 5 mg via ORAL
  Filled 2016-03-01: qty 1

## 2016-03-01 MED ORDER — AMIODARONE HCL 200 MG PO TABS: 200.0000 mg | ORAL_TABLET | Freq: Every day | ORAL | Status: AC

## 2016-03-01 MED ORDER — MAGNESIUM 200 MG PO TABS: 400.0000 mg | ORAL_TABLET | Freq: Every day | ORAL | Status: AC

## 2016-03-01 MED ORDER — INSULIN ASPART 100 UNIT/ML ~~LOC~~ SOLN: 0.0000 [IU] | Freq: Three times a day (TID) | SUBCUTANEOUS | Status: AC

## 2016-03-01 MED ORDER — GABAPENTIN 300 MG PO CAPS: 300.0000 mg | ORAL_CAPSULE | Freq: Every day | ORAL | Status: AC

## 2016-03-01 MED ORDER — INSULIN DETEMIR 100 UNIT/ML ~~LOC~~ SOLN: 30.0000 [IU] | Freq: Every day | SUBCUTANEOUS | Status: AC

## 2016-03-01 MED ORDER — HYDRALAZINE HCL 50 MG PO TABS: 50.0000 mg | ORAL_TABLET | Freq: Three times a day (TID) | ORAL | Status: AC

## 2016-03-01 MED ORDER — SERTRALINE HCL 50 MG PO TABS: 50.0000 mg | ORAL_TABLET | Freq: Every day | ORAL | Status: AC

## 2016-03-01 NOTE — Progress Notes (Signed)
Patient ID: Tristan Baker, male   DOB: 17-Aug-1941, 75 y.o.   MRN: [id number]     SUBJECTIVE: Nausea/vomiting overnight, better with Zofran.  UOP 1000 cc with high dose diuretics.  Weight rising.  Creatinine up again.  Co-ox remains good.   Scheduled Meds: . aspirin EC  81 mg Oral Daily  . enoxaparin (LOVENOX) injection  30 mg Subcutaneous Q24H  . feeding supplement (GLUCERNA SHAKE)  237 mL Oral BID BM  . furosemide  160 mg Intravenous Q8H  . gabapentin  300 mg Oral QHS  . hydrALAZINE  50 mg Oral Q8H  . insulin aspart  3 Units Subcutaneous TID WC  . insulin detemir  30 Units Subcutaneous QHS  . isosorbide mononitrate  60 mg Oral Daily  . magnesium oxide  400 mg Oral Daily  . metolazone  5 mg Oral Once  . multivitamin with minerals  1 tablet Oral QHS  . polyethylene glycol  17 g Oral Daily  . potassium chloride  20 mEq Oral Daily  . sertraline  50 mg Oral QHS  . sodium chloride flush  3 mL Intravenous Q12H   Continuous Infusions: . milrinone 0.25 mcg/kg/min (02/29/16 2331)   PRN Meds:.sodium chloride, acetaminophen, nitroGLYCERIN, ondansetron (ZOFRAN) IV, sodium chloride flush, sodium chloride flush, traMADol    Filed Vitals:   02/29/16 0500 02/29/16 1056 02/29/16 1950 03/01/16 0336  BP: 110/58 101/63 110/59 109/63  Pulse: 85 85 80 108  Temp: 98.9 F (37.2 C)  98.1 F (36.7 C) 98.8 F (37.1 C)  TempSrc: Oral  Oral Oral  Resp: Height:      Weight: 192 lb 1.6 oz (87.136 kg)   194 lb 1.6 oz (88.043 kg)  SpO2: 97% 94% 96% 94%    Intake/Output Summary (Last 24 hours) at 03/01/16 0801 Last data filed at 03/01/16 0612  Gross per 24 hour  Intake    360 ml  Output   1000 ml  Net   -640 ml    LABS: Basic Metabolic Panel:  Recent Labs  91/47/82 0620 03/01/16 0500  NA 131* 131*  K 4.2 4.5  CL 94* 93*  CO2 25 24  GLUCOSE 136* 190*  BUN 86* 97*  CREATININE 5.07* 5.76*  CALCIUM 8.8* 9.0   Liver Function Tests:  Recent Labs  02/28/16 0121  AST 52*    ALT 39  ALKPHOS 153*  BILITOT 1.2  PROT 7.0  ALBUMIN 3.2*   No results for input(s): LIPASE, AMYLASE in the last 72 hours. CBC:  Recent Labs  03/01/16 0500  WBC 9.5  HGB 10.6*  HCT 33.3*  MCV 91.2  PLT 208   Cardiac Enzymes: No results for input(s): CKTOTAL, CKMB, CKMBINDEX, TROPONINI in the last 72 hours. BNP: Invalid input(s): POCBNP D-Dimer: No results for input(s): DDIMER in the last 72 hours. Hemoglobin A1C: No results for input(s): HGBA1C in the last 72 hours. Fasting Lipid Panel: No results for input(s): CHOL, HDL, LDLCALC, TRIG, CHOLHDL, LDLDIRECT in the last 72 hours. Thyroid Function Tests: No results for input(s): TSH, T4TOTAL, T3FREE, THYROIDAB in the last 72 hours.  Invalid input(s): FREET3 Anemia Panel: No results for input(s): VITAMINB12, FOLATE, FERRITIN, TIBC, IRON, RETICCTPCT in the last 72 hours.  RADIOLOGY: Dg Chest Port 1 View  02/26/2016  CLINICAL DATA:  PICC line placement. EXAM: PORTABLE CHEST 1 VIEW COMPARISON:  02/24/2016 FINDINGS: Interval replacement or advancement of right PICC line. Tip now localizes to the lower esophagus. No visible pneumothorax. Postoperative changes  in the mediastinum. Cardiac enlargement. Increasing opacity in the right mid and lower lung probably representing pneumonia or atelectasis. Probable small right pleural effusion. Left lung is grossly clear. IMPRESSION: Right PICC line tip localizes over the distal SVC. No pneumothorax. Increasing infiltration or atelectasis in the right lung with small right pleural effusion. Cardiac enlargement. Electronically Signed   By: Burman NievesWilliam  Stevens M.D.   On: 02/26/2016 02:31   Dg Chest Port 1 View  02/24/2016  CLINICAL DATA:  Evaluate PICC line position. Congestive heart failure. EXAM: PORTABLE CHEST 1 VIEW COMPARISON:  02/07/2016 and 01/05/2016. FINDINGS: 1859 hours. The previously demonstrated right arm PICC has significantly retracted to the level of the mid right clavicle, probably  in the right subclavian vein. There is stable cardiomegaly status post CABG. There is improved aeration of the lung bases with mild residual right basilar pulmonary opacity. No significant pleural effusion or edema seen. There is a subtle lucency projecting lateral to the right upper lobe which could reflect a small pneumothorax or overlying skin fold. IMPRESSION: 1. Interval significant retraction of the right arm PICC into the right subclavian vein. PICC line repositioning recommended. 2. Interval improvement in the aeration of the lung bases. Cannot exclude a small right-sided pneumothorax. 3. These results were called by telephone at the time of interpretation on 02/24/2016 at 6:33 pm to patient's nurse, Martie LeeSabrina, who verbally acknowledged these results. Electronically Signed   By: Carey BullocksWilliam  Veazey M.D.   On: 02/24/2016 18:35   Dg Chest Port 1 View  02/07/2016  CLINICAL DATA:  Distant heart failure. Bilateral lower extremity edema. EXAM: PORTABLE CHEST 1 VIEW COMPARISON:  01/05/2016 FINDINGS: Right PICC line tip:  SVC. Mild enlargement of the cardiopericardial silhouette and prior CABG. Bibasilar airspace opacities with poor definition of the hemidiaphragms, at least partially due to small bilateral pleural effusions. Mild airway thickening. Aortic arch atherosclerosis.  Thoracic spondylosis. IMPRESSION: 1. Mild enlargement of the cardiopericardial silhouette. 2. Bilateral pleural effusions. Right greater than left basilar airspace opacity potentially from atelectasis or pneumonia. 3. New right PICC line tip: SVC. 4. Atherosclerotic aortic arch. 5. Airway thickening is present, suggesting bronchitis or reactive airways disease. Electronically Signed   By: Gaylyn RongWalter  Liebkemann M.D.   On: 02/07/2016 14:02   Dg Abd Portable 1v  02/27/2016  CLINICAL DATA:  Nausea vomiting and diarrhea for 2 days. EXAM: PORTABLE ABDOMEN - 1 VIEW COMPARISON:  None. FINDINGS: The bowel gas pattern is normal. No radio-opaque calculi or  other significant radiographic abnormality are seen. Postsurgical changes are seen in the lower thorax. IMPRESSION: Negative. Electronically Signed   By: Ted Mcalpineobrinka  Dimitrova M.D.   On: 02/27/2016 12:16    PHYSICAL EXAM General: NAD Neck: JVP 16 cm, no thyromegaly or thyroid nodule.  Lungs: Clear to auscultation bilaterally with normal respiratory effort. CV: Lateral PMI.  Heart regular S1/S2, no S3/S4, 2/6 SEM RUSB.  2+ edema to knees bilaterally.   Abdomen: Soft, nontender, no hepatosplenomegaly, mild distention.  Neurologic: Alert and oriented x 3.  Psych: Normal affect. Extremities: No clubbing or cyanosis.   TELEMETRY: Reviewed telemetry pt in NSR with frequent PVCs and NSVT runs  ASSESSMENT AND PLAN: 75 yo with history of chronic systolic CHF/ischemic CMP, CAD, and CKD who had been on home milrinone was admitted with acute/chronic systolic CHF and AKI on CKD.  1. Acute on chronic systolic CHF: Ischemic cardiomyopathy. LVEF 25-30% Recent admission with identification of low output heart failure. Unfortunately, it appears at this point that he is end-stage. He  advanced renal disease precludes LVAD placement and he is of too advanced age for transplant. We were unable to titrate him off milrinone during recent admission, he remains on milrinone at 0.25. He has ongoing marked volume overload and poor diuresis, creatinine continues to rise.   - With worsening AKI and marked volume overload, we have few options for him.  Not long-term HD candidate given inotrope dependence.  He has opted for hospice so that he can leave hospital.  I think this is a reasonable choice.   - Continue milrinone 0.25, he can go home on this with hospice for comfort.  - Continue hydralazine/Imdur with stable BP.  - No ACEI/ARB/spironolactone with AKI and no beta blocker with low output.  - Continue Lasix 160 mg IV every 8 hrs with metolazone while in the hospital.  When he is discharged, would send out on torsemide  100 mg bid to try to clear fluid as much as possible for palliation.    2. CAD: S/p CABG.  No chest pain.  He remains on ASA 81, atorvastatin was stopped over weekend in setting of nausea.  3. PVCs/NSVT: Worse on milrinone but has been inotrope-dependent.  Amiodarone was stopped over weekend due to nausea with improvement . Ongoing short NSVT runs.  As he is now going home with hospice, would not restart amiodarone.  4. AKI on CKD stage IV: Creatinine continues to worsen, now up to 5.76.  He is not an HD candidate with end-stage CHF on home inotropes.  This was discussed again with patient and wife today.  5. Aortic stenosis: Mild to moderate on last echo.  6. Nausea: Likely combination of uremia and gut congestion from CHF.  Will use Zofran and phenergan as needed, can be provided by hospice at home.  6. Disposition: Plan to go to daughter's house today with hospice.  He will continue home milrinone 0.25 along with torsemide 100 mg bid.    Marca Ancona 03/01/2016 8:01 AM

## 2016-03-01 NOTE — Progress Notes (Addendum)
1600 [facility] staff converted Milrinone pump to home use .

## 2016-03-01 NOTE — Progress Notes (Signed)
Daily Progress Note   Patient Name: Tristan Baker       Date: 03/01/2016 DOB: 31-Dec-1940  Age: 75 y.o. MRN#: [id number] Attending Physician: Jolaine Artist, MD Primary Care Physician: Marton Redwood, MD Admit Date: 02/24/2016  Reason for Consultation/Follow-up: Establishing goals of care  Subjective: Met this AM with patient and his wife.  He reports that he had some nausea last evening.  Improved following zofran and he is feeling better this AM. He is looking forward to getting out of hospital and is hopeful for d/c later today. Hard of hearing, resting in bed Wife at bedside See discussions below:  Length of Stay: 6  Current Medications: Scheduled Meds:  . aspirin EC  81 mg Oral Daily  . enoxaparin (LOVENOX) injection  30 mg Subcutaneous Q24H  . feeding supplement (GLUCERNA SHAKE)  237 mL Oral BID BM  . furosemide  160 mg Intravenous Q8H  . gabapentin  300 mg Oral QHS  . hydrALAZINE  50 mg Oral Q8H  . insulin aspart  3 Units Subcutaneous TID WC  . insulin detemir  30 Units Subcutaneous QHS  . isosorbide mononitrate  60 mg Oral Daily  . magnesium oxide  400 mg Oral Daily  . multivitamin with minerals  1 tablet Oral QHS  . polyethylene glycol  17 g Oral Daily  . potassium chloride  20 mEq Oral Daily  . sertraline  50 mg Oral QHS  . sodium chloride flush  3 mL Intravenous Q12H    Continuous Infusions: . milrinone 0.25 mcg/kg/min (02/29/16 2331)    PRN Meds: sodium chloride, acetaminophen, nitroGLYCERIN, ondansetron (ZOFRAN) IV, sodium chloride flush, sodium chloride flush, traMADol  Physical Exam         Awake alert NAD Diminished S1 S2 Trace edema Non focal  Vital Signs: BP 109/63 mmHg  Pulse 108  Temp(Src) 98.8 F (37.1 C) (Oral)  Resp 21  Ht '5\' 7"'  (1.702  m)  Wt 88.043 kg (194 lb 1.6 oz)  BMI 30.39 kg/m2  SpO2 94% SpO2: SpO2: 94 % O2 Device: O2 Device: Not Delivered O2 Flow Rate: O2 Flow Rate (L/min): 3 L/min  Intake/output summary:   Intake/Output Summary (Last 24 hours) at 03/01/16 0940 Last data filed at 03/01/16 0919  Gross per 24 hour  Intake    240 ml  Output  801 ml  Net   -561 ml   LBM: Last BM Date: 02/28/16 Baseline Weight: Weight: 86.546 kg (190 lb 12.8 oz) Most recent weight: Weight: 88.043 kg (194 lb 1.6 oz)       Palliative Assessment/Data:    Flowsheet Rows        Most Recent Value   Intake Tab    Referral Department  Cardiology   Unit at Time of Referral  Cardiac/Telemetry Unit   Palliative Care Primary Diagnosis  Cardiac   Date Notified  02/27/16   Palliative Care Type  New Palliative care   Reason for referral  Clarify Goals of Care   Date of Admission  02/24/16   Date first seen by Palliative Care  02/27/16   # of days Palliative referral response time  0 Day(s)   # of days IP prior to Palliative referral  3   Clinical Assessment    Palliative Performance Scale Score  40%   Pain Max last 24 hours  4   Pain Min Last 24 hours  3   Dyspnea Max Last 24 Hours  4   Dyspnea Min Last 24 hours  3   Nausea Max Last 24 Hours  7   Nausea Min Last 24 Hours  6   Psychosocial & Spiritual Assessment    Palliative Care Outcomes    Patient/Family meeting held?  Yes   Who was at the meeting?  Patient, wife      Patient Active Problem List   Diagnosis Date Noted  . Nausea & vomiting   . Encounter for palliative care   . Goals of care, counseling/discussion   . CKD (chronic kidney disease) stage 4, GFR 15-29 ml/min (HCC) 02/18/2016  . Chronic combined systolic (congestive) and diastolic (congestive) heart failure ([place]) 02/18/2016  . Coronary artery disease involving native coronary artery of native heart without angina pectoris   . Acute on chronic renal failure ([place])   . Acute on chronic systolic CHF  (congestive heart failure) ([place]) 02/04/2016  . Hyperlipidemia 06/18/2013  . H/O class III angina pectoris 12/12/2012  . CAD (coronary artery disease) 12/12/2012  . CKD (chronic kidney disease) stage 3, GFR 30-59 ml/min 12/12/2012  . Ischemic heart disease 08/22/2011  . Diabetes mellitus 08/22/2011  . HTN (hypertension) 08/22/2011  . Obesity 08/22/2011    Palliative Care Assessment & Plan   Patient Profile:    Assessment:  end stage CHF Worsening renal function Declining functional status Underlying htn dm IHD  Recommendations/Plan:   Met again this AM with patient and wife, then spoke with daughter via phone:  Plan is for discharge home with hospice services. He is going to stay with his daughter with hope that he will eventually be able to transition back to his own home.  We also discussed residential hospice as this may be an option that family would like to consider as his care needs continue to increase.  Completed Durable DNR and placed on the chart.   If milrinone is not able to be covered by hospice, patient elects for it to be D/C.  Will reach out to hospice to discuss.  Discussed his blood sugars with daughter as well.  I advised her that I would not be aggressive in management if he is not symptomatic.  My bigger concern is if he would become hypoglycemic.  She states that he feels bad when sugars are in 400s.  I think that at this stage, a goal of sugar in 300s  or less for his comfort is reasonable.  Prognosis appears to be few weeks at this point, given rising serum creatinine, volume overload, ? of d/c milrinone on d/c.  On discharge, would recommend scripts for: - Oxycodone Concentrate 109m/0.5ml: 569m(0.2523msublingual every 1 hour as needed for pain or shortness of breath: Disp 89m50mLorazepam 2mg/55mconcentrated solution: 1mg (85mml) s46mingual every 4 hours as needed for anxiety: Disp 89ml - 29mol 2mg/ml s69mtion: 0.5mg (0.2558m subl54mal every 4 hours  as needed for agitation or nausea: Disp 89ml  - Atr14me drops: 3 drops sublingual every 4 hours as needed for excess secretions: Disp 15ml     Cod56matus:    Code Status Orders        Start     Ordered   02/28/16 1051  Limited resuscitation (code)   Continuous    Question Answer Comment  In the event of cardiac or respiratory ARREST: Initiate Code Blue, Call Rapid Response No   In the event of cardiac or respiratory ARREST: Perform CPR No   In the event of cardiac or respiratory ARREST: Perform Intubation/Mechanical Ventilation No   In the event of cardiac or respiratory ARREST: Use NIPPV/BiPAp only if indicated Yes   In the event of cardiac or respiratory ARREST: Administer ACLS medications if indicated No   In the event of cardiac or respiratory ARREST: Perform Defibrillation or Cardioversion if indicated No      02/28/16 1050    Code Status History    Date Active Date Inactive Code Status Order ID Comments User Context   02/24/2016  4:25 PM 02/28/2016 10:51 AM Full Code [id number]  [id number] Friarent   02/05/2016 11:21 AM 02/11/2016  9:15 PM Full Code [id number]  [id number] Friarent       Prognosis:   < 4 weeks  Discharge Planning:  Home with Hospice  Care plan was discussed with patient, wife in person and daughter Deonna via phArta Bruceank you for allowing the Palliative Medicine Team to assist in the care of this patient.   Time In: 0835 Time Out: 0915 Total Time 40 Prolonged Time Billed  no       Greater than 50%  of this time was spent counseling and coordinating care related to the above assessment and plan.  Denesha Brouse,Micheline [phone] Palliative Medicine Team phone at [phone] for [phone] and concerns.

## 2016-03-01 NOTE — Progress Notes (Signed)
1700 daughter not agreeable with discharge summary as prepared by CHF team . Made a call to Amy Cregg. Spoken with daughter  . A new set of. discharge summary done and printed for pt. Spouse vebalized understanding . Awaiting for ambulance pick up

## 2016-03-01 NOTE — Discharge Summary (Addendum)
Advanced Heart Failure Team  Discharge Summary   Patient ID: Tristan Baker MRN: [id number], DOB/AGE: 1941-01-24 75 y.o. Admit date: 02/24/2016 D/C date:     03/01/2016   Primary Discharge Diagnoses:  1. Acute on chronic systolic CHF: Ischemic cardiomyopathy. LVEF 25-30%Home milrinone 0.25 mcg.   2. CAD: S/p CABG.  3. PVCs/NSVT:  4. AKI on CKD stage IV: Creatinine continues to worsen. He is not an HD candidate with end-stage CHF on home inotropes.  5. Aortic stenosis: Mild to moderate on last echo.  Hospital Course: 75 yo with history of chronic systolic CHF/ischemic CMP, CAD, and CKD who had been on home milrinone was admitted with acute/chronic systolic CHF and AKI on CKD.    1. Acute on chronic systolic CHF: Ischemic cardiomyopathy. LVEF 25-30% Recent admission with identification of low output heart failure. Unfortunately, it appears at this point that he is end-stage. He advanced renal disease precludes LVAD placement and he is of too advanced age for transplant. We were unable to titrate him off milrinone during recent admission, he remains on milrinone at 0.25. He has ongoing marked volume overload and poor diuresis, creatinine continues to rise.  - With worsening AKI and marked volume overload, we have few options for him. Not long-term HD candidate given inotrope dependence. He has opted for hospice so that he can leave hospital.   - Continue milrinone 0.25, he can go home on this with hospice for comfort.  - Continue hydralazine/Imdur with stable BP. Diuresed with IV lasix + metolazone and transitioned to torsemide 100 mg twice a day.  No ACEI/ARB/spironolactone with AKI and no beta blocker with low output.  2. CAD: S/p CABG. No chest pain. He remains on ASA 81, atorvastatin was stopped over weekend in setting of nausea.  3. PVCs/NSVT: Worse on milrinone but has been inotrope-dependent. Amiodarone was stopped over weekend due to nausea with improvement . Ongoing  short NSVT runs. As he is now going home with hospice, would not restart amiodarone.  4. AKI on CKD stage IV: Creatinine continues to worsen, now up to 5.76. He is not an HD candidate with end-stage CHF on home inotropes. This was discussed again with patient and wife today.  5. Aortic stenosis: Mild to moderate on last echo.  6. Nausea: Likely combination of uremia and gut congestion from CHF. Will use Zofran and phenergan as needed, can be provided by hospice at home.  6. Disposition: Plan to go to daughter's house today with hospice. He will continue home milrinone 0.25 along with torsemide 100 mg bid.   He is being discharged to his daughters house with [facility]. Did not qualify for oxygen. Oxygen saturations remained >88%.   Discharge Weight:  194 pounds  Discharge Vitals: Blood pressure 92/49, pulse 90, temperature 98.3 F (36.8 C), temperature source Oral, resp. rate 18, height 5\' 7"  (1.702 m), weight 194 lb 1.6 oz (88.043 kg), SpO2 95 %.  Labs: Lab Results  Component Value Date   WBC 9.5 03/01/2016   HGB 10.6* 03/01/2016   HCT 33.3* 03/01/2016   MCV 91.2 03/01/2016   PLT 208 03/01/2016    Recent Labs Lab 02/28/16 0121  03/01/16 0500  NA 131*  < > 131*  K 4.4  < > 4.5  CL 95*  < > 93*  CO2 25  < > 24  BUN 78*  < > 97*  CREATININE 4.59*  < > 5.76*  CALCIUM 8.9  < > 9.0  PROT 7.0  --   --  BILITOT 1.2  --   --   ALKPHOS 153*  --   --   ALT 39  --   --   AST 52*  --   --   GLUCOSE 166*  < > 190*  < > = values in this interval not displayed. Lab Results  Component Value Date   CHOL 130 03/28/2014   HDL 28* 03/28/2014   LDLCALC 22 03/28/2014   TRIG 400* 03/28/2014   BNP (last 3 results)  Recent Labs  01/05/16 1525 01/08/16 1354 02/05/16 1429  BNP 1597.7* 1319.2* 2128.4*    ProBNP (last 3 results) No results for input(s): PROBNP in the last 8760 hours.   Diagnostic Studies/Procedures   No results found.  Discharge Medications      Medication List    STOP taking these medications        atorvastatin 40 MG tablet  Commonly known as:  LIPITOR     levothyroxine 25 MCG tablet  Commonly known as:  SYNTHROID, LEVOTHROID     polyethylene glycol packet  Commonly known as:  MIRALAX / GLYCOLAX      TAKE these medications        amiodarone 200 MG tablet  Commonly known as:  PACERONE  Take 1 tablet (200 mg total) by mouth daily.     aspirin 81 MG tablet  Take 1 tablet (81 mg total) by mouth daily.     gabapentin 300 MG capsule  Commonly known as:  NEURONTIN  Take 1 capsule (300 mg total) by mouth at bedtime.     hydrALAZINE 50 MG tablet  Commonly known as:  APRESOLINE  Take 50 mg by mouth 3 (three) times daily.     hydrALAZINE 50 MG tablet  Commonly known as:  APRESOLINE  Take 1 tablet (50 mg total) by mouth every 8 (eight) hours.     insulin aspart 100 UNIT/ML injection  Commonly known as:  novoLOG  Inject 0-15 Units into the skin 3 (three) times daily before meals. Per sliding scale for morning and noon dose: CBG 0-100 0 units, 101-150 3 units, 151-200 5 units, 201-250 7 units, 251-300 9 units, 301-350 12 units, >300 15 units, >400 call MD   for night dose: CBG 201-250 2 units, 251-300 3 units, 301-350 4 units, >350 5 units, >400 call MD     insulin detemir 100 UNIT/ML injection  Commonly known as:  LEVEMIR  Inject 0.3 mLs (30 Units total) into the skin at bedtime.     isosorbide mononitrate 60 MG 24 hr tablet  Commonly known as:  IMDUR  Take 1 tablet (60 mg total) by mouth daily.     Magnesium 200 MG Tabs  Take 2 tablets (400 mg total) by mouth daily.     milrinone 20 MG/100 ML Soln infusion  Commonly known as:  PRIMACOR  Inject 22.575 mcg/min into the vein continuous.     multivitamin with minerals Tabs tablet  Take 1 tablet by mouth at bedtime.     nitroGLYCERIN 0.4 MG SL tablet  Commonly known as:  NITROSTAT  Place 1 tablet (0.4 mg total) under the tongue every 5 (five) minutes as  needed for chest pain (up to 3 doses).     ondansetron 4 MG tablet  Commonly known as:  ZOFRAN  Take 1 tablet (4 mg total) by mouth every 6 (six) hours as needed for nausea or vomiting.     OXYGEN  Inhale 2 L into the lungs See admin instructions.  Inhale daily at bedtime and as needed for shortness of breath     potassium chloride SA 20 MEQ tablet  Commonly known as:  K-DUR,KLOR-CON  Take 1 tablet (20 mEq total) by mouth daily.     sertraline 50 MG tablet  Commonly known as:  ZOLOFT  Take 1 tablet (50 mg total) by mouth at bedtime.     spironolactone 25 MG tablet  Commonly known as:  ALDACTONE  Take 0.5 tablets (12.5 mg total) by mouth daily.     torsemide 20 MG tablet  Commonly known as:  DEMADEX  Take 2 tablets (40 mg total) by mouth daily.     traMADol 50 MG tablet  Commonly known as:  ULTRAM  Take 1 tablet (50 mg total) by mouth every 12 (twelve) hours as needed for moderate pain.        Disposition   The patient will be discharged in stable condition to home. Discharge Instructions    Diet - low sodium heart healthy    Complete by:  As directed      Heart Failure patients record your daily weight using the same scale at the same time of day    Complete by:  As directed      Increase activity slowly    Complete by:  As directed           Follow-up Information    Follow up with Martha ClanShaw, William, MD.   Specialty:  Internal Medicine   Why:  LEFT MESSAGE OFFICE CALL PATIENT [phone]   Contact information:   [address] [place] [address] [phone]       Follow up with Marca Anconaalton McLean, MD On 03-25-2016.   Specialty:  Cardiology   Why:  9:40 Garage Code [id number]    Contact information:   [address] [place] [address] [phone]         Duration of Discharge Encounter: Greater than 35 minutes   Signed, Amy Clegg NP-C  03/01/2016, 5:37 PM

## 2016-03-01 NOTE — Progress Notes (Signed)
CM talked to Red Budraig son in law, the patient will be staying his home at discharge. Address [address] [place], [place] [address] Cell- [phone]  [facility] - paperwork completed and placed on the shadow chart; [facility] called and will pick up the patient aroung 4:30 pm. CM talked to Lyndonraig and he stated that he will be home around that time.  Jeri ModenaPam Mansour Balboa RN with [facility] in to connect the home Milronone drip. [facility] [phone]( [phone]) called and updated, transport time will be around 4:30 pm; B Shelba FlakeChandler RN,MHA,BSN [phone]

## 2016-03-04 ENCOUNTER — Encounter (HOSPITAL_COMMUNITY): Payer: Medicare Other

## 2016-03-24 ENCOUNTER — Encounter (HOSPITAL_COMMUNITY): Payer: Medicare Other

## 2016-03-24 ENCOUNTER — Telehealth (HOSPITAL_COMMUNITY): Payer: Self-pay | Admitting: Vascular Surgery

## 2016-03-24 NOTE — Telephone Encounter (Signed)
Pt daughter called .Marland Kitchen. Pt is at end of life , hospice states he has a day or so, pt appt today will be canceled

## 2016-03-30 ENCOUNTER — Encounter (HOSPITAL_COMMUNITY): Payer: Medicare Other

## 2016-04-12 DEATH — deceased

## 2016-04-22 ENCOUNTER — Ambulatory Visit: Payer: Medicare Other | Admitting: Cardiology

## 2017-06-01 IMAGING — CR DG CHEST 1V PORT
1 series · 1 of 1 positions shown · non-contrast
Comparison: 02/07/2016 and 01/05/2016.

CLINICAL DATA: Evaluate PICC line position. Congestive heart
failure.

EXAM:
PORTABLE CHEST 1 VIEW

[AP]
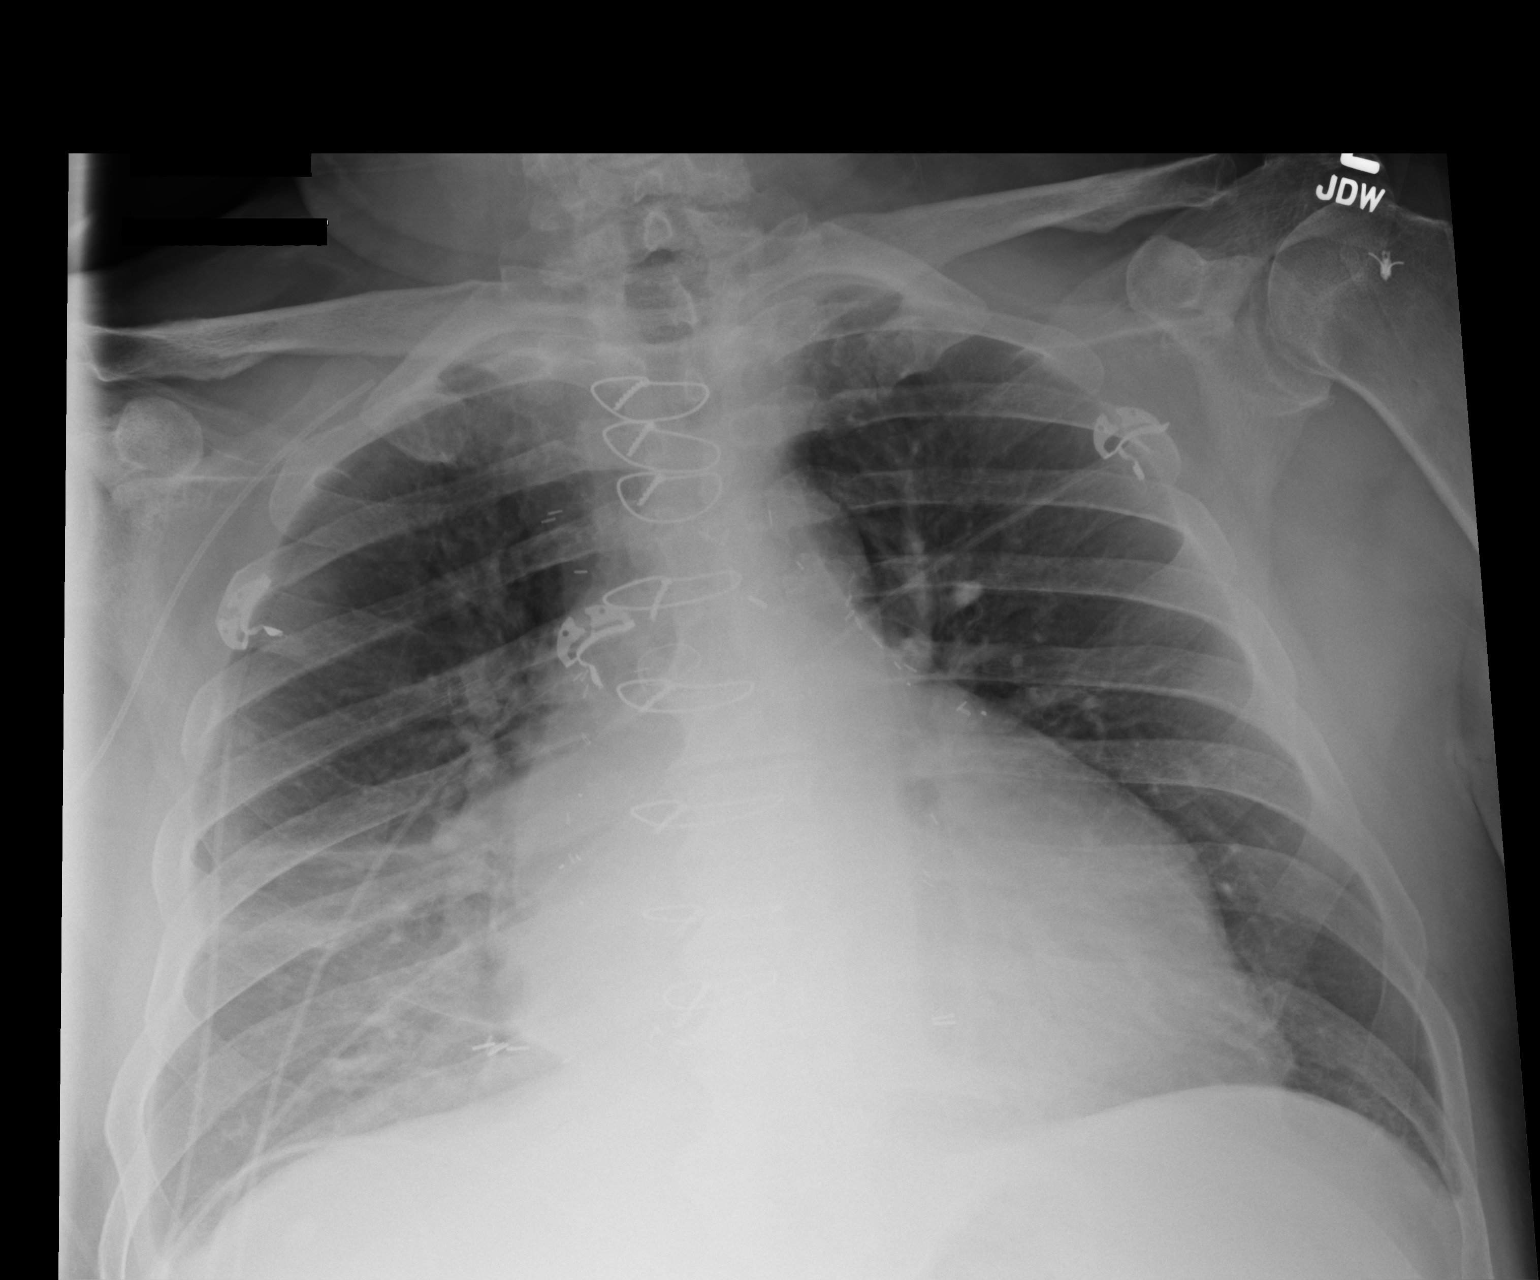

[1 of 1 positions shown; findings below may reference images not displayed]

FINDINGS: 3670 hours. The previously demonstrated right arm PICC has
significantly retracted to the level of the mid right clavicle,
probably in the right subclavian vein. There is stable cardiomegaly
status post CABG. There is improved aeration of the lung bases with
mild residual right basilar pulmonary opacity. No significant
pleural effusion or edema seen. There is a subtle lucency projecting
lateral to the right upper lobe which could reflect a small
pneumothorax or overlying skin fold.
IMPRESSION: 1. Interval significant retraction of the right arm PICC into the
right subclavian vein. PICC line repositioning recommended.
2. Interval improvement in the aeration of the lung bases. Cannot
exclude a small right-sided pneumothorax.
3. These results were called by telephone at the time of
interpretation on 02/24/2016 at [DATE] to patient's nurse, Tiger,
who verbally acknowledged these results.

## 2017-06-02 IMAGING — CR DG CHEST 1V PORT
1 series · 1 of 1 positions shown · non-contrast
Comparison: 02/24/2016

CLINICAL DATA: PICC line placement.

EXAM:
PORTABLE CHEST 1 VIEW

[AP]
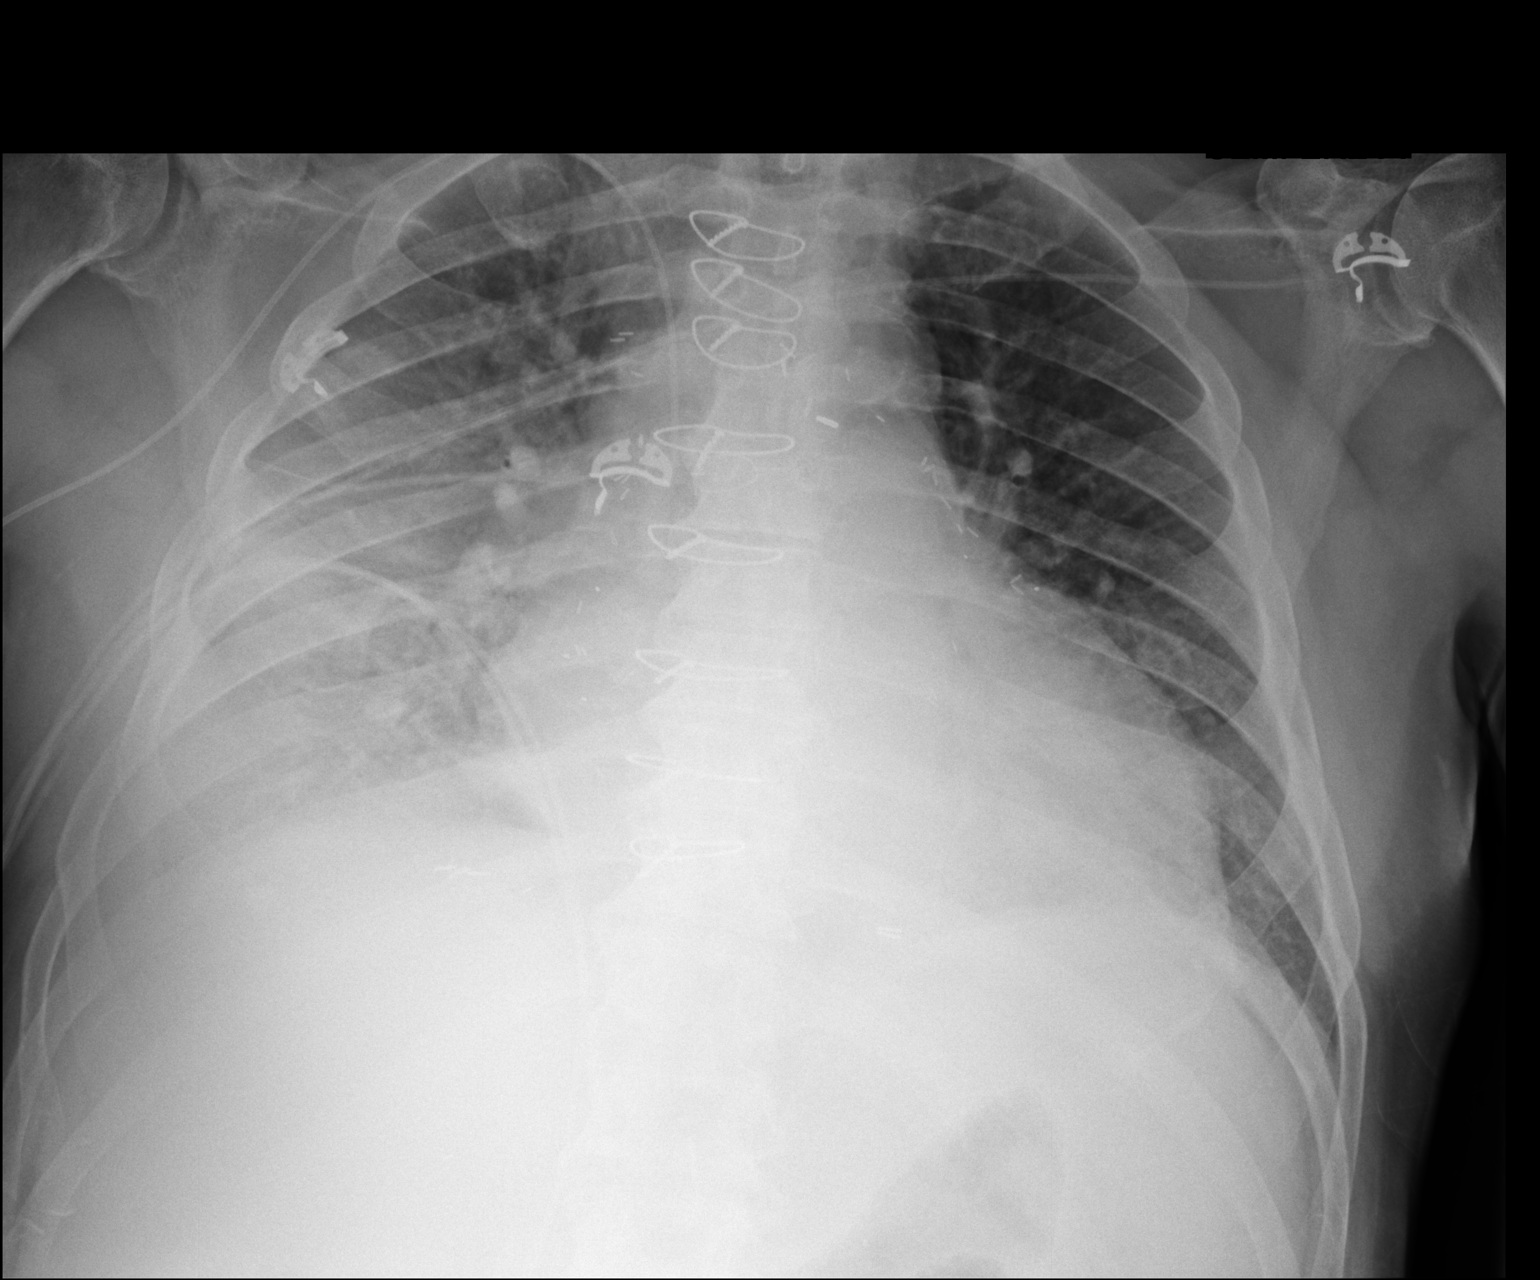

[1 of 1 positions shown; findings below may reference images not displayed]

FINDINGS: Interval replacement or advancement of right PICC line. Tip now
localizes to the lower esophagus. No visible pneumothorax.
Postoperative changes in the mediastinum. Cardiac enlargement.
Increasing opacity in the right mid and lower lung probably
representing pneumonia or atelectasis. Probable small right pleural
effusion. Left lung is grossly clear.
IMPRESSION: Right PICC line tip localizes over the distal SVC. No pneumothorax.
Increasing infiltration or atelectasis in the right lung with small
right pleural effusion. Cardiac enlargement.

## 2017-06-04 IMAGING — CR DG ABD PORTABLE 1V
1 series · 1 of 1 positions shown · non-contrast
Comparison: None.

CLINICAL DATA: Nausea vomiting and diarrhea for 2 days.

EXAM:
PORTABLE ABDOMEN - 1 VIEW

[AP]
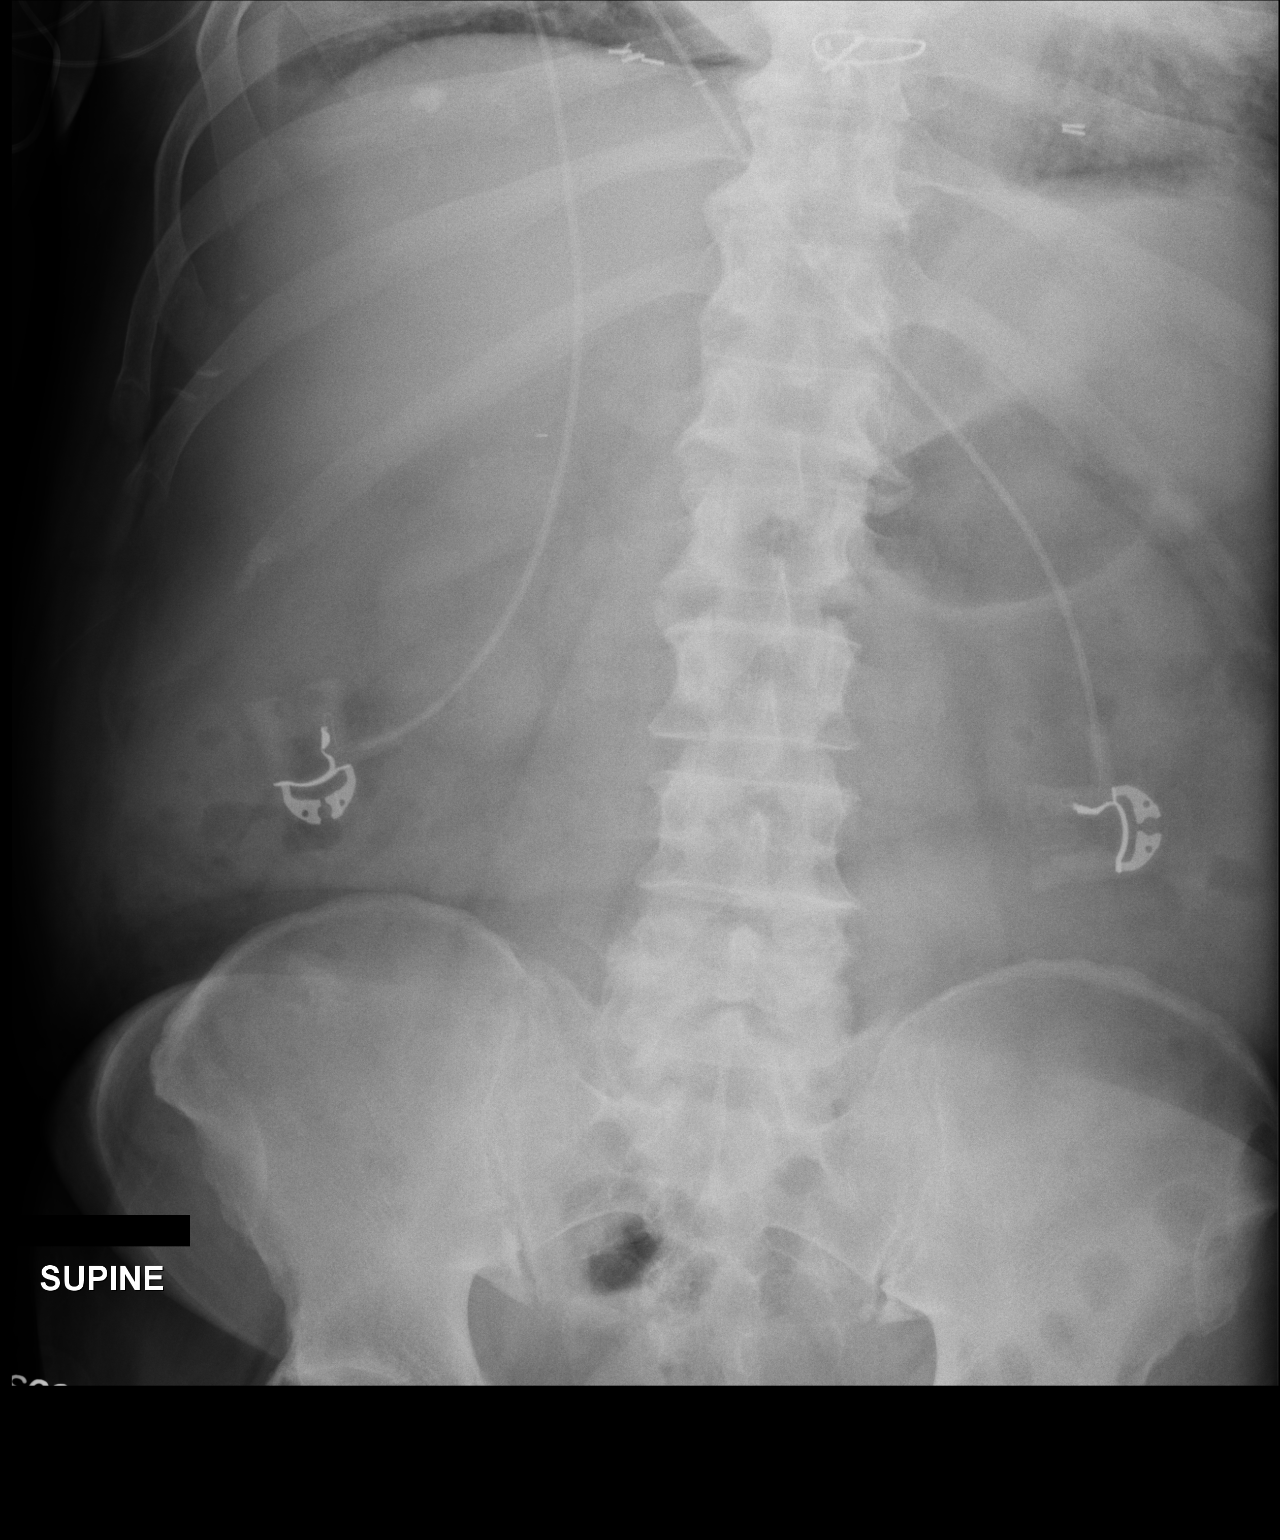

[1 of 1 positions shown; findings below may reference images not displayed]

FINDINGS: The bowel gas pattern is normal. No radio-opaque calculi or other
significant radiographic abnormality are seen. Postsurgical changes
are seen in the lower thorax.
IMPRESSION: Negative.
